# Patient Record
Sex: Male | Born: 1950 | Race: White | Hispanic: No | Marital: Married | Smoking: Former smoker
Health system: Southern US, Community
[De-identification: ages and names within clinical notes are randomized; demographics above are authoritative.]

## PROBLEM LIST (undated history)

## (undated) DIAGNOSIS — R079 Chest pain, unspecified: Secondary | ICD-10-CM

## (undated) DIAGNOSIS — J029 Acute pharyngitis, unspecified: Secondary | ICD-10-CM

## (undated) DIAGNOSIS — M79604 Pain in right leg: Secondary | ICD-10-CM

## (undated) DIAGNOSIS — R7303 Prediabetes: Secondary | ICD-10-CM

## (undated) DIAGNOSIS — M5416 Radiculopathy, lumbar region: Secondary | ICD-10-CM

## (undated) DIAGNOSIS — K219 Gastro-esophageal reflux disease without esophagitis: Secondary | ICD-10-CM

## (undated) DIAGNOSIS — E119 Type 2 diabetes mellitus without complications: Secondary | ICD-10-CM

## (undated) DIAGNOSIS — M25519 Pain in unspecified shoulder: Secondary | ICD-10-CM

## (undated) DIAGNOSIS — J189 Pneumonia, unspecified organism: Secondary | ICD-10-CM

## (undated) DIAGNOSIS — E785 Hyperlipidemia, unspecified: Secondary | ICD-10-CM

## (undated) DIAGNOSIS — I4891 Unspecified atrial fibrillation: Secondary | ICD-10-CM

## (undated) DIAGNOSIS — M549 Dorsalgia, unspecified: Secondary | ICD-10-CM

## (undated) DIAGNOSIS — M069 Rheumatoid arthritis, unspecified: Secondary | ICD-10-CM

## (undated) DIAGNOSIS — I10 Essential (primary) hypertension: Secondary | ICD-10-CM

## (undated) DIAGNOSIS — K4090 Unilateral inguinal hernia, without obstruction or gangrene, not specified as recurrent: Secondary | ICD-10-CM

## (undated) DIAGNOSIS — M25512 Pain in left shoulder: Secondary | ICD-10-CM

## (undated) DIAGNOSIS — F17200 Nicotine dependence, unspecified, uncomplicated: Secondary | ICD-10-CM

## (undated) DIAGNOSIS — G4700 Insomnia, unspecified: Secondary | ICD-10-CM

## (undated) DIAGNOSIS — G8929 Other chronic pain: Secondary | ICD-10-CM

## (undated) DIAGNOSIS — K2100 Gastro-esophageal reflux disease with esophagitis, without bleeding: Secondary | ICD-10-CM

## (undated) DIAGNOSIS — M1990 Unspecified osteoarthritis, unspecified site: Secondary | ICD-10-CM

## (undated) DIAGNOSIS — I499 Cardiac arrhythmia, unspecified: Secondary | ICD-10-CM

## (undated) DIAGNOSIS — Z950 Presence of cardiac pacemaker: Secondary | ICD-10-CM

## (undated) HISTORY — DX: Pain in right leg: M79.604

## (undated) HISTORY — DX: Chest pain, unspecified: R07.9

## (undated) HISTORY — DX: Hyperlipidemia, unspecified: E78.5

## (undated) HISTORY — DX: Rheumatoid arthritis, unspecified: M06.9

## (undated) HISTORY — DX: Nicotine dependence, unspecified, uncomplicated: F17.200

## (undated) HISTORY — DX: Unspecified atrial fibrillation: I48.91

## (undated) HISTORY — DX: Insomnia, unspecified: G47.00

## (undated) HISTORY — DX: Presence of cardiac pacemaker: Z95.0

## (undated) HISTORY — PX: SHOULDER ARTHROSCOPY: SHX128

## (undated) HISTORY — DX: Type 2 diabetes mellitus without complications: E11.9

## (undated) HISTORY — DX: Unilateral inguinal hernia, without obstruction or gangrene, not specified as recurrent: K40.90

## (undated) HISTORY — PX: LEFT ATRIAL APPENDAGE OCCLUSION: SHX173A

## (undated) HISTORY — DX: Unspecified osteoarthritis, unspecified site: M19.90

## (undated) HISTORY — PX: DENTAL SURGERY: SHX609

## (undated) HISTORY — DX: Gastro-esophageal reflux disease with esophagitis, without bleeding: K21.00

## (undated) HISTORY — PX: CHOLECYSTECTOMY: SHX55

## (undated) HISTORY — PX: VASECTOMY: SHX75

## (undated) HISTORY — DX: Pain in left shoulder: M25.512

---
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
## 2009-01-02 ENCOUNTER — Ambulatory Visit: Payer: Self-pay | Admitting: Cardiology

## 2010-08-28 ENCOUNTER — Ambulatory Visit (HOSPITAL_COMMUNITY)
Admission: RE | Admit: 2010-08-28 | Discharge: 2010-08-28 | Payer: Self-pay | Source: Home / Self Care | Attending: Pulmonary Disease | Admitting: Pulmonary Disease

## 2012-03-09 ENCOUNTER — Encounter: Payer: Self-pay | Admitting: Family Medicine

## 2012-03-24 ENCOUNTER — Telehealth (HOSPITAL_COMMUNITY): Payer: Self-pay | Admitting: Dietician

## 2012-03-24 NOTE — Telephone Encounter (Signed)
Received referral via fax from Dr. Hawkins for dx: diabetes. 

## 2012-03-31 NOTE — Telephone Encounter (Signed)
Sent letter to pt home via US Mail in attempt to contact pt to schedule appointment.  

## 2012-04-06 NOTE — Telephone Encounter (Signed)
Sent letter to pt home via US Mail in attempt to contact pt to schedule appointment.  

## 2012-04-10 NOTE — Telephone Encounter (Signed)
Pt has not responded to attempts to contact to schedule appointment. Referral filed.  

## 2012-04-22 LAB — HM DIABETES FOOT EXAM

## 2013-02-12 DIAGNOSIS — R079 Chest pain, unspecified: Secondary | ICD-10-CM

## 2013-03-31 ENCOUNTER — Encounter: Payer: Self-pay | Admitting: Cardiovascular Disease

## 2013-04-30 ENCOUNTER — Ambulatory Visit: Payer: Private Health Insurance - Indemnity | Admitting: Cardiovascular Disease

## 2013-06-10 DIAGNOSIS — Z950 Presence of cardiac pacemaker: Secondary | ICD-10-CM | POA: Insufficient documentation

## 2013-06-10 DIAGNOSIS — I10 Essential (primary) hypertension: Secondary | ICD-10-CM | POA: Insufficient documentation

## 2013-06-10 DIAGNOSIS — E119 Type 2 diabetes mellitus without complications: Secondary | ICD-10-CM | POA: Insufficient documentation

## 2013-06-14 ENCOUNTER — Encounter: Payer: Self-pay | Admitting: Cardiovascular Disease

## 2013-06-14 ENCOUNTER — Ambulatory Visit (INDEPENDENT_AMBULATORY_CARE_PROVIDER_SITE_OTHER): Payer: Private Health Insurance - Indemnity | Admitting: Cardiovascular Disease

## 2013-06-14 ENCOUNTER — Encounter: Payer: Self-pay | Admitting: *Deleted

## 2013-06-14 VITALS — BP 151/89 | HR 72 | Ht 67.0 in | Wt 163.0 lb

## 2013-06-14 DIAGNOSIS — R0600 Dyspnea, unspecified: Secondary | ICD-10-CM

## 2013-06-14 DIAGNOSIS — R0609 Other forms of dyspnea: Secondary | ICD-10-CM

## 2013-06-14 DIAGNOSIS — Z950 Presence of cardiac pacemaker: Secondary | ICD-10-CM

## 2013-06-14 DIAGNOSIS — R079 Chest pain, unspecified: Secondary | ICD-10-CM

## 2013-06-14 DIAGNOSIS — I10 Essential (primary) hypertension: Secondary | ICD-10-CM

## 2013-06-14 DIAGNOSIS — F17200 Nicotine dependence, unspecified, uncomplicated: Secondary | ICD-10-CM | POA: Insufficient documentation

## 2013-06-14 MED ORDER — DILTIAZEM HCL ER COATED BEADS 120 MG PO CP24: 120.0000 mg | ORAL_CAPSULE | Freq: Every day | ORAL | Status: DC

## 2013-06-14 NOTE — Assessment & Plan Note (Signed)
Surprised ischemia not looked at in setting of AV block.  Pain atypical CRF;s HTN and smoking  ECG paced F/U lexiscan myovue

## 2013-06-14 NOTE — Progress Notes (Signed)
Patient ID: [place], male   DOB: [birth year], 62 y.o.   MRN: [id number] 62 yo new patient.  Seen at [facility] April with syncope.  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  Prior history remarkable for HTN  Notes reviewed From [facility] Medtronic pacer placed Dual chamber Atrial lead had poor capture or dislodgement and had to be revised.  Patient R/O No history of CAD No mention of any echo  Results  Model Adapta -: [id number] Both leads also medtronic  Feels lethargic on beta blocker. Some atypical chest pain Some exertional with dyspnea He is a smoker and sees [employer]/support/administrative officer.  No formal PFT;s on chart. Pain seems to be related to pacer lead as it goes over clavicle. Worse in [place] but can happen with activity.  Compliant with meds. 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  All other systems reviewed and negative   General: Affect appropriate Healthy:  appears stated age HEENT: normal Neck supple with no adenopathy JVP normal no bruits no thyromegaly Lungs clear with no wheezing and good diaphragmatic motion Heart:  S1/S2 no murmur,rub, gallop or click PMI normal Abdomen: benighn, BS positve, no tenderness, no AAA no bruit.  No HSM or HJR Distal pulses intact with no bruits No edema Neuro non-focal Skin warm and dry No muscular weakness  Medications Current Outpatient Prescriptions  Medication Sig Dispense Refill  . lisinopril-hydrochlorothiazide (PRINZIDE,ZESTORETIC) 20-12.5 MG per tablet Take 1 tablet by mouth daily.      . metoprolol succinate (TOPROL-XL) 50 MG 24 hr tablet Take 50 mg by mouth daily.       Marland Kitchen omeprazole (PRILOSEC) 20 MG  capsule Take 20 mg by mouth daily.      Marland Kitchen diltiazem (CARDIZEM CD) 120 MG 24 hr capsule Take 1 capsule (120 mg total) by mouth daily.  90 capsule  3   No current facility-administered medications for this visit.    Allergies Review of patient's allergies indicates not on file.  Family History: No family history on file.  Social History: History   Social History  . Marital Status: Married    Spouse Name: N/A    Number of Children: N/A  . Years of Education: N/A   Occupational History  . Not on file.   Social History Main Topics  . Smoking status: Current Every Day Smoker -- 0.50 packs/day  . Smokeless tobacco: Not on file  . Alcohol Use: Not on file  . Drug Use: Not on file  . Sexual Activity: Not on file   Other Topics Concern  . Not on file   Social History Narrative  . No narrative on file    Electrocardiogram:  AV pacing rate 83  Assessment and Plan

## 2013-06-14 NOTE — Patient Instructions (Addendum)
Your physician recommends that you schedule a follow-up appointment in:TO ESTABLISH WITH [place] FOR PACER CHECK  Your physician has requested that you have an echocardiogram. Echocardiography is a painless test that uses sound waves to create images of your heart. It provides your doctor with information about the size and shape of your heart and how well your heart's chambers and valves are working. This procedure takes approximately one hour. There are no restrictions for this procedure.  Your physician has requested that you have en exercise stress myoview. For further information please visit [email or web address] Please follow instruction sheet, as given.  WE WILL CALL YOU WITH YOUR TEST RESULTS/INSTRUCTIONS/NEXT STEPS ONCE RECEIVED BY THE PROVIDER   Your physician has recommended you make the following change in your medication:   1) STOP TAKING METOPROLOL  2) START TAKING CARDIZEM CD 120MG  ONCE DAILY

## 2013-06-14 NOTE — Assessment & Plan Note (Signed)
Counseled for less than 10 minutes F/U Hawkins for PFTs and further discussion of Chantix

## 2013-06-14 NOTE — Assessment & Plan Note (Signed)
Needs to establish with GT and pacer clinic.  Especially with need for RA lead revision ECG ok.  Echo to assess RV/LV size and extent of TR Post transvenous lead

## 2013-06-14 NOTE — Assessment & Plan Note (Signed)
Change toprol ot cardizem and see if lethargy improves

## 2013-06-23 ENCOUNTER — Encounter: Payer: Self-pay | Admitting: Internal Medicine

## 2013-06-23 ENCOUNTER — Ambulatory Visit (INDEPENDENT_AMBULATORY_CARE_PROVIDER_SITE_OTHER): Payer: Private Health Insurance - Indemnity | Admitting: Internal Medicine

## 2013-06-23 VITALS — BP 112/75 | HR 94 | Ht 67.0 in | Wt 161.0 lb

## 2013-06-23 DIAGNOSIS — I10 Essential (primary) hypertension: Secondary | ICD-10-CM

## 2013-06-23 DIAGNOSIS — T82198A Other mechanical complication of other cardiac electronic device, initial encounter: Secondary | ICD-10-CM

## 2013-06-23 DIAGNOSIS — Z950 Presence of cardiac pacemaker: Secondary | ICD-10-CM

## 2013-06-23 DIAGNOSIS — I442 Atrioventricular block, complete: Secondary | ICD-10-CM

## 2013-06-23 LAB — MDC_IDC_ENUM_SESS_TYPE_INCLINIC
Brady Statistic AP VS Percent: 9 %
Brady Statistic AS VS Percent: 65 %
Lead Channel Impedance Value: 791 Ohm
Lead Channel Pacing Threshold Amplitude: 0.5 V
Lead Channel Pacing Threshold Pulse Width: 0.4 ms
Lead Channel Sensing Intrinsic Amplitude: 4 mV
Lead Channel Setting Pacing Amplitude: 1.5 V

## 2013-06-23 NOTE — Patient Instructions (Addendum)
Your physician recommends that you schedule a follow-up appointment in: to be determined with Dr Ladona Ridgel and 6 months with Alisia Ferrari will receive a reminder letter two months in advance reminding you to call and schedule your appointment. If you don't receive this letter, please contact our office.   A chest x-ray takes a picture of the organs and structures inside the chest, including the heart, lungs, and blood vessels. This test can show several things, including, whether the heart is enlarges; whether fluid is building up in the lungs; and whether pacemaker / defibrillator leads are still in place.

## 2013-06-23 NOTE — Progress Notes (Signed)
      HPI [place] is referred today by Dr. Eden Emms for ongoing evaluation and management of a permanent pacemaker. He is a very pleasant 62 year old man who experienced recurrent syncope several months ago while vacationing at [place]. He was simply found to have intermittent complete heart block and underwent permanent pacemaker insertion. The procedure was complicated by atrial lead dislodgment. He is now transferring his care to our office as the patient lives in [place]. 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 He denies shortness of breath. He has rare episodes of chest discomfort, mostly nonexertional. No peripheral edema. Since his pacemaker was placed, he has had no recurrent syncope. Not on File   Current Outpatient Prescriptions  Medication Sig Dispense Refill  . diltiazem (CARDIZEM CD) 120 MG 24 hr capsule Take 1 capsule (120 mg total) by mouth daily.  90 capsule  3  . lisinopril-hydrochlorothiazide (PRINZIDE,ZESTORETIC) 20-12.5 MG per tablet Take 1 tablet by mouth daily.      Marland Kitchen omeprazole (PRILOSEC) 20 MG capsule Take 20 mg by mouth daily.       No current facility-administered medications for this visit.     History reviewed. No pertinent past medical history.  ROS:   All systems reviewed and negative except as noted in the HPI.   History reviewed. No pertinent past surgical history.   No family history on file.   History   Social History  . Marital Status: Married    Spouse Name: N/A    Number of Children: N/A  . Years of Education: N/A   Occupational History  . Not on file.   Social History Main Topics  . Smoking status: Current Every Day Smoker -- 0.50 packs/day  . Smokeless tobacco: Not on file  . Alcohol Use: Not on file  . Drug Use: Not on file  . Sexual Activity: Not on file   Other Topics Concern  . Not on file   Social History Narrative  . No narrative on file     BP 112/75  Pulse 94  Ht 5\' 7"   (1.702 m)  Wt 161 lb (73.029 kg)  BMI 25.21 kg/m2  Physical Exam:  Well appearing middle-aged man, NAD HEENT: Unremarkable Neck:  No JVD, no thyromegally Back:  No CVA tenderness Lungs:  Clear with no wheezes, rales, or rhonchi. HEART:  Regular rate rhythm, no murmurs, no rubs, no clicks Abd:  soft, positive bowel sounds, no organomegally, no rebound, no guarding Ext:  2 plus pulses, no edema, no cyanosis, no clubbing Skin:  No rashes no nodules Neuro:  CN II through XII intact, motor grossly intact   DEVICE  Normal device function   Except for some atrialelevated pacing thresholds.  See PaceArt for details.   Assess/Plan:

## 2013-06-23 NOTE — Assessment & Plan Note (Signed)
Interrogation of his Medtronic pacemaker suggest that his atrial pacing threshold is elevated. His P waves appear to be satisfactory. This raises the suspicion that his atrial lead was dislodged, and I recommended that he undergo PA and lateral chest x-ray. It is lead appears to be stuck to the atrial myocardium, then I would plan to reprogram the device as he is pacing in the atrium very minimally. It is lead is free-floating, then we would strongly consider lead revision.

## 2013-06-23 NOTE — Assessment & Plan Note (Signed)
His blood pressure appears to be well-controlled. He'll continue his current medical therapy. He is encouraged to maintain a low-sodium diet.

## 2013-06-28 ENCOUNTER — Ambulatory Visit (HOSPITAL_COMMUNITY)
Admission: RE | Admit: 2013-06-28 | Discharge: 2013-06-28 | Disposition: A | Discharge status: Home / Self Care | Payer: Private Health Insurance - Indemnity | Source: Ambulatory Visit | Attending: Internal Medicine | Admitting: Internal Medicine

## 2013-06-28 ENCOUNTER — Encounter (HOSPITAL_COMMUNITY)
Admission: RE | Admit: 2013-06-28 | Discharge: 2013-06-28 | Disposition: A | Discharge status: Home / Self Care | Payer: Private Health Insurance - Indemnity | Source: Ambulatory Visit | Attending: Cardiovascular Disease | Admitting: Cardiovascular Disease

## 2013-06-28 ENCOUNTER — Encounter (HOSPITAL_COMMUNITY): Payer: Self-pay

## 2013-06-28 ENCOUNTER — Encounter: Payer: Self-pay | Admitting: Internal Medicine

## 2013-06-28 ENCOUNTER — Ambulatory Visit (HOSPITAL_COMMUNITY)
Admission: RE | Admit: 2013-06-28 | Discharge: 2013-06-28 | Disposition: A | Discharge status: Home / Self Care | Payer: Private Health Insurance - Indemnity | Source: Ambulatory Visit | Attending: Cardiovascular Disease | Admitting: Cardiovascular Disease

## 2013-06-28 DIAGNOSIS — R0989 Other specified symptoms and signs involving the circulatory and respiratory systems: Secondary | ICD-10-CM | POA: Insufficient documentation

## 2013-06-28 DIAGNOSIS — R079 Chest pain, unspecified: Secondary | ICD-10-CM

## 2013-06-28 DIAGNOSIS — I10 Essential (primary) hypertension: Secondary | ICD-10-CM

## 2013-06-28 DIAGNOSIS — Z950 Presence of cardiac pacemaker: Secondary | ICD-10-CM | POA: Insufficient documentation

## 2013-06-28 DIAGNOSIS — T82198A Other mechanical complication of other cardiac electronic device, initial encounter: Secondary | ICD-10-CM

## 2013-06-28 DIAGNOSIS — R0600 Dyspnea, unspecified: Secondary | ICD-10-CM

## 2013-06-28 DIAGNOSIS — R0609 Other forms of dyspnea: Secondary | ICD-10-CM | POA: Insufficient documentation

## 2013-06-28 DIAGNOSIS — E119 Type 2 diabetes mellitus without complications: Secondary | ICD-10-CM | POA: Insufficient documentation

## 2013-06-28 DIAGNOSIS — F17200 Nicotine dependence, unspecified, uncomplicated: Secondary | ICD-10-CM | POA: Insufficient documentation

## 2013-06-28 DIAGNOSIS — I519 Heart disease, unspecified: Secondary | ICD-10-CM

## 2013-06-28 MED ORDER — TECHNETIUM TC 99M SESTAMIBI GENERIC - CARDIOLITE
10.0000 | Freq: Once | INTRAVENOUS | Status: AC | PRN
  Administered 2013-06-28: 10 via INTRAVENOUS

## 2013-06-28 MED ORDER — REGADENOSON 0.4 MG/5ML IV SOLN
INTRAVENOUS | Status: AC
  Filled 2013-06-28: qty 5

## 2013-06-28 MED ORDER — TECHNETIUM TC 99M SESTAMIBI - CARDIOLITE
30.0000 | Freq: Once | INTRAVENOUS | Status: AC | PRN
  Administered 2013-06-28: 11:00:00 30 via INTRAVENOUS

## 2013-06-28 MED ORDER — SODIUM CHLORIDE 0.9 % IJ SOLN
INTRAMUSCULAR | Status: AC
  Administered 2013-06-28: 10 mL via INTRAVENOUS
  Filled 2013-06-28: qty 10

## 2013-06-28 NOTE — Progress Notes (Signed)
Stress Lab Nurses Notes - [facility]  Adam [place] 06/28/2013 Reason for doing test: Chest Pain Type of test: Stress Cardiolite Nurse performing test: Parke Poisson, RN Nuclear Medicine Tech: Lyndel Pleasure Echo Tech: Not Applicable MD performing test: Dr.Branch/ K.Lawrence NP Family MD: Dr. Juanetta Gosling Test explained and consent signed: yes IV started: 22g jelco, Saline lock flushed, No redness or edema and Saline lock started in radiology Symptoms: Fatigue & knee discomfort. Treatment/Intervention: None Reason test stopped: fatigue and reached target HR After recovery IV was: Discontinued via X-ray tech and No redness or edema Patient to return to Nuc. Med at :11:45 Patient discharged: Home Patient's Condition upon discharge was: stable Comments: During test peak BP 200/84 & HR 157.  Recovery BP 132/86 & HR 82.  Symptoms resolved in recovery. [name] T

## 2013-06-28 NOTE — Progress Notes (Signed)
*  PRELIMINARY RESULTS* Echocardiogram 2D Echocardiogram has been performed.  Coco Sharpnack 06/28/2013, 12:59 PM

## 2013-11-11 ENCOUNTER — Encounter: Payer: Self-pay | Admitting: *Deleted

## 2013-12-06 ENCOUNTER — Encounter: Payer: Self-pay | Admitting: Internal Medicine

## 2013-12-06 ENCOUNTER — Ambulatory Visit (INDEPENDENT_AMBULATORY_CARE_PROVIDER_SITE_OTHER): Payer: Managed Care, Other (non HMO) | Admitting: Internal Medicine

## 2013-12-06 VITALS — BP 132/83 | HR 86 | Ht 66.0 in | Wt 174.5 lb

## 2013-12-06 DIAGNOSIS — Z950 Presence of cardiac pacemaker: Secondary | ICD-10-CM | POA: Diagnosis not present

## 2013-12-06 DIAGNOSIS — E119 Type 2 diabetes mellitus without complications: Secondary | ICD-10-CM | POA: Diagnosis not present

## 2013-12-06 DIAGNOSIS — I442 Atrioventricular block, complete: Secondary | ICD-10-CM | POA: Diagnosis not present

## 2013-12-06 LAB — MDC_IDC_ENUM_SESS_TYPE_INCLINIC
Battery Impedance: 100 Ohm
Battery Voltage: 2.8 V
Brady Statistic AP VS Percent: 10 %
Brady Statistic AS VP Percent: 25 %
Date Time Interrogation Session: 20150511115231
Lead Channel Impedance Value: 564 Ohm
Lead Channel Pacing Threshold Amplitude: 0.5 V
Lead Channel Pacing Threshold Pulse Width: 0.4 ms
Lead Channel Setting Pacing Amplitude: 2 V
MDC IDC MSMT BATTERY REMAINING LONGEVITY: 164 mo
MDC IDC MSMT LEADCHNL RA SENSING INTR AMPL: 4 mV
MDC IDC MSMT LEADCHNL RV IMPEDANCE VALUE: 742 Ohm
MDC IDC MSMT LEADCHNL RV PACING THRESHOLD AMPLITUDE: 0.75 V
MDC IDC MSMT LEADCHNL RV PACING THRESHOLD PULSEWIDTH: 0.4 ms
MDC IDC MSMT LEADCHNL RV SENSING INTR AMPL: 22.4 mV
MDC IDC SET LEADCHNL RA PACING AMPLITUDE: 1.5 V
MDC IDC SET LEADCHNL RV PACING PULSEWIDTH: 0.4 ms
MDC IDC SET LEADCHNL RV SENSING SENSITIVITY: 5.6 mV
MDC IDC STAT BRADY AP VP PERCENT: 10 %
MDC IDC STAT BRADY AS VS PERCENT: 55 %

## 2013-12-06 NOTE — Assessment & Plan Note (Signed)
His device is working normally. Will recheck in several months. 

## 2013-12-06 NOTE — Patient Instructions (Signed)
Your physician recommends that you schedule a follow-up appointment in: 1 year with Dr Taylor You will receive a reminder letter two months in advance reminding you to call and schedule your appointment. If you don't receive this letter, please contact our office.  Your physician recommends that you continue on your current medications as directed. Please refer to the Current Medication list given to you today.   

## 2013-12-06 NOTE — Assessment & Plan Note (Signed)
He is diet controlled. He has had one episode of atrial fib on the monitor. He blood sugars are running around a100-120 now that he has lost 20 lbs. He may well ultimately need to be on a NOAC or coumadin, particularly if he requires medical therapy.

## 2013-12-06 NOTE — Progress Notes (Signed)
      HPI [place] returns today for ongoing evaluation and management of a permanent pacemaker. He is a very pleasant 63 year old man who experienced recurrent syncope several months ago while vacationing at [employer]. He was simply found to have intermittent complete heart block and underwent permanent pacemaker insertion. The procedure was complicated by atrial lead dislodgment. He is now transferring his care to our office as the patient lives in [place]. He has rare episodes of chest discomfort, mostly nonexertional. No peripheral edema. Since his pacemaker was placed, he has had no recurrent syncope. He denies palpitations Not on File   Current Outpatient Prescriptions  Medication Sig Dispense Refill  . ASPIRIN LOW DOSE 81 MG EC tablet daily.      Marland Kitchen diltiazem (CARDIZEM CD) 120 MG 24 hr capsule Take 1 capsule (120 mg total) by mouth daily.  90 capsule  3  . lisinopril-hydrochlorothiazide (PRINZIDE,ZESTORETIC) 20-12.5 MG per tablet Take 1 tablet by mouth daily.      Marland Kitchen omeprazole (PRILOSEC) 20 MG capsule Take 20 mg by mouth daily.       No current facility-administered medications for this visit.     History reviewed. No pertinent past medical history.  ROS:   All systems reviewed and negative except as noted in the HPI.   History reviewed. No pertinent past surgical history.   No family history on file.   History   Social History  . Marital Status: Married    Spouse Name: N/A    Number of Children: N/A  . Years of Education: N/A   Occupational History  . Not on file.   Social History Main Topics  . Smoking status: Current Every Day Smoker -- 0.50 packs/day  . Smokeless tobacco: Not on file  . Alcohol Use: Not on file  . Drug Use: Not on file  . Sexual Activity: Not on file   Other Topics Concern  . Not on file   Social History Narrative  . No narrative on file     BP 132/83  Pulse 86  Ht 5\' 6"  (1.676 m)  Wt 174 lb 8 oz (79.153 kg)  BMI  28.18 kg/m2  Physical Exam:  Well appearing middle-aged man, NAD HEENT: Unremarkable Neck:  No JVD, no thyromegally Back:  No CVA tenderness Lungs:  Clear with no wheezes, rales, or rhonchi. HEART:  Regular rate rhythm, no murmurs, no rubs, no clicks Abd:  soft, positive bowel sounds, no organomegally, no rebound, no guarding Ext:  2 plus pulses, no edema, no cyanosis, no clubbing Skin:  No rashes no nodules Neuro:  CN II through XII intact, motor grossly intact   DEVICE  Normal device function   See PaceArt for details.   Assess/Plan:

## 2014-06-02 ENCOUNTER — Other Ambulatory Visit: Payer: Self-pay | Admitting: Cardiovascular Disease

## 2014-06-17 ENCOUNTER — Other Ambulatory Visit (HOSPITAL_COMMUNITY): Payer: Self-pay | Admitting: Pulmonary Disease

## 2014-06-17 ENCOUNTER — Ambulatory Visit (HOSPITAL_COMMUNITY)
Admission: RE | Admit: 2014-06-17 | Discharge: 2014-06-17 | Disposition: A | Discharge status: Home / Self Care | Payer: Private Health Insurance - Indemnity | Source: Ambulatory Visit | Attending: Pulmonary Disease | Admitting: Pulmonary Disease

## 2014-06-17 ENCOUNTER — Ambulatory Visit (INDEPENDENT_AMBULATORY_CARE_PROVIDER_SITE_OTHER): Payer: Private Health Insurance - Indemnity | Admitting: *Deleted

## 2014-06-17 DIAGNOSIS — M79642 Pain in left hand: Secondary | ICD-10-CM | POA: Diagnosis present

## 2014-06-17 DIAGNOSIS — I442 Atrioventricular block, complete: Secondary | ICD-10-CM

## 2014-06-17 DIAGNOSIS — M79641 Pain in right hand: Secondary | ICD-10-CM | POA: Insufficient documentation

## 2014-06-17 DIAGNOSIS — M19041 Primary osteoarthritis, right hand: Secondary | ICD-10-CM | POA: Insufficient documentation

## 2014-06-17 DIAGNOSIS — M19042 Primary osteoarthritis, left hand: Secondary | ICD-10-CM | POA: Insufficient documentation

## 2014-06-17 LAB — MDC_IDC_ENUM_SESS_TYPE_INCLINIC
Battery Voltage: 2.8 V
Brady Statistic AP VS Percent: 13 %
Brady Statistic AS VS Percent: 76 %
Date Time Interrogation Session: 20151120141039
Lead Channel Pacing Threshold Amplitude: 0.5 V
Lead Channel Pacing Threshold Pulse Width: 0.4 ms
Lead Channel Pacing Threshold Pulse Width: 0.4 ms
Lead Channel Sensing Intrinsic Amplitude: 22.4 mV
Lead Channel Setting Pacing Amplitude: 2 V
Lead Channel Setting Sensing Sensitivity: 5.6 mV
MDC IDC MSMT BATTERY IMPEDANCE: 110 Ohm
MDC IDC MSMT BATTERY REMAINING LONGEVITY: 166 mo
MDC IDC MSMT LEADCHNL RA IMPEDANCE VALUE: 590 Ohm
MDC IDC MSMT LEADCHNL RA PACING THRESHOLD AMPLITUDE: 0.5 V
MDC IDC MSMT LEADCHNL RA SENSING INTR AMPL: 4 mV
MDC IDC MSMT LEADCHNL RV IMPEDANCE VALUE: 856 Ohm
MDC IDC SET LEADCHNL RA PACING AMPLITUDE: 1.5 V
MDC IDC SET LEADCHNL RV PACING PULSEWIDTH: 0.4 ms
MDC IDC STAT BRADY AP VP PERCENT: 3 %
MDC IDC STAT BRADY AS VP PERCENT: 8 %

## 2014-06-17 LAB — PSA: PSA: 1.02

## 2014-06-17 NOTE — Progress Notes (Signed)
PPM check in office. 

## 2014-07-05 ENCOUNTER — Telehealth: Payer: Self-pay | Admitting: Internal Medicine

## 2014-07-05 NOTE — Telephone Encounter (Addendum)
Called pt to make an appt to discuss re-starting anti-coagulation, has questionable stroke risk factors, pt made appt but would like to know why he needs to be on blood thinner, pls call

## 2014-07-06 NOTE — Telephone Encounter (Signed)
I left message for patient yesterday,have not had a return call from him yet.Will ty again

## 2014-07-06 NOTE — Telephone Encounter (Signed)
I spoke with patient and discussed that it was noted on pacer check he had an episode of a-fib.I explained what a-fib is and why he is a greater risk of stroke and why we use anticoagulants.Patient verbalized understanding and was satisfied with explanation and we keep scheduled apt 08/01/14

## 2014-08-01 ENCOUNTER — Encounter: Payer: Self-pay | Admitting: Internal Medicine

## 2014-08-01 ENCOUNTER — Encounter: Payer: Self-pay | Admitting: *Deleted

## 2014-08-01 ENCOUNTER — Ambulatory Visit (INDEPENDENT_AMBULATORY_CARE_PROVIDER_SITE_OTHER): Payer: BLUE CROSS/BLUE SHIELD | Admitting: Internal Medicine

## 2014-08-01 VITALS — BP 166/98 | HR 74 | Ht 67.0 in | Wt 179.0 lb

## 2014-08-01 DIAGNOSIS — R072 Precordial pain: Secondary | ICD-10-CM

## 2014-08-01 DIAGNOSIS — I480 Paroxysmal atrial fibrillation: Secondary | ICD-10-CM

## 2014-08-01 DIAGNOSIS — I442 Atrioventricular block, complete: Secondary | ICD-10-CM

## 2014-08-01 DIAGNOSIS — I10 Essential (primary) hypertension: Secondary | ICD-10-CM | POA: Diagnosis not present

## 2014-08-01 LAB — MDC_IDC_ENUM_SESS_TYPE_INCLINIC
Battery Remaining Longevity: 149 mo
Battery Voltage: 2.79 V
Brady Statistic AS VP Percent: 48 %
Date Time Interrogation Session: 20160104122247
Lead Channel Impedance Value: 581 Ohm
Lead Channel Impedance Value: 830 Ohm
MDC IDC MSMT BATTERY IMPEDANCE: 134 Ohm
MDC IDC SET LEADCHNL RA PACING AMPLITUDE: 1.5 V
MDC IDC SET LEADCHNL RV PACING AMPLITUDE: 2 V
MDC IDC SET LEADCHNL RV PACING PULSEWIDTH: 0.4 ms
MDC IDC SET LEADCHNL RV SENSING SENSITIVITY: 5.6 mV
MDC IDC STAT BRADY AP VP PERCENT: 17 %
MDC IDC STAT BRADY AP VS PERCENT: 5 %
MDC IDC STAT BRADY AS VS PERCENT: 30 %

## 2014-08-01 MED ORDER — DILTIAZEM HCL ER COATED BEADS 360 MG PO CP24: 360.0000 mg | ORAL_CAPSULE | Freq: Every day | ORAL | Status: DC

## 2014-08-01 MED ORDER — RIVAROXABAN 20 MG PO TABS: 20.0000 mg | ORAL_TABLET | Freq: Every day | ORAL | Status: DC

## 2014-08-01 NOTE — Assessment & Plan Note (Signed)
His blood pressure has been elevated. I will ask him to increase his dose of cardizem. He is encouraged to reduce his sodium intake.

## 2014-08-01 NOTE — Patient Instructions (Addendum)
Remote monitoring is used to monitor your Pacemaker of ICD from home. This monitoring reduces the number of office visits required to check your device to one time per year. It allows [place] to keep an eye on the functioning of your device to ensure it is working properly. You are scheduled for a device check from home on April 4TH. You may send your transmission at any time that day. If you have a wireless device, the transmission will be sent automatically. After your physician reviews your transmission, you will receive a postcard with your next transmission date.  Your physician recommends that you schedule a follow-up appointment after your stress test  Your physician has requested that you have en exercise stress myoview. For further information please visit [email or web address]. Please follow instruction sheet, as given.  Your physician has recommended you make the following change in your medication:   INCREASE CARDIZEM TO 360 MG DAILY  START XARELTO 20 MG DAILY   WE HAVE GIVEN YOU SAMPLES AND PHARMACY CARD  Thank you for choosing [facility]!!

## 2014-08-01 NOTE — Progress Notes (Signed)
      HPI Mr. Rote returns today for ongoing evaluation and management of a permanent pacemaker. He is a very pleasant 64 year old man who experienced recurrent syncope several months ago while vacationing at [employer]. He was subsequently found to have intermittent complete heart block and underwent permanent pacemaker insertion. He has had increasingly frequent episodes of chest pressure, which is sometimes related to exertion and sometimes not. He notes that his blood pressure has not been well controlled. He does not notice palpitations. No Known Allergies   Current Outpatient Prescriptions  Medication Sig Dispense Refill  . ASPIRIN LOW DOSE 81 MG EC tablet daily.    . Cinnamon 500 MG capsule Take 1,000 mg by mouth daily.    . Coenzyme Q10 (CO Q-10) 100 MG CAPS Take 2 tablets by mouth daily.    Marland Kitchen losartan-hydrochlorothiazide (HYZAAR) 100-12.5 MG per tablet Take 1 tablet by mouth daily.    . Omega-3 Fatty Acids (FISH OIL) 1000 MG CAPS Take 2 capsules by mouth daily.    Marland Kitchen omeprazole (PRILOSEC) 20 MG capsule Take 20 mg by mouth daily.    . vitamin B-12 (CYANOCOBALAMIN) 1000 MCG tablet Take 1,000 mcg by mouth daily.    Marland Kitchen diltiazem (CARDIZEM CD) 360 MG 24 hr capsule Take 1 capsule (360 mg total) by mouth daily. 30 capsule 3  . rivaroxaban (XARELTO) 20 MG TABS tablet Take 1 tablet (20 mg total) by mouth daily with supper. 30 tablet 3   No current facility-administered medications for this visit.     No past medical history on file.  ROS:   All systems reviewed and negative except as noted in the HPI.   No past surgical history on file.   No family history on file.   History   Social History  . Marital Status: Married    Spouse Name: N/A    Number of Children: N/A  . Years of Education: N/A   Occupational History  . Not on file.   Social History Main Topics  . Smoking status: Current Every Day Smoker -- 0.50 packs/day  . Smokeless tobacco: Not on file  . Alcohol  Use: Not on file  . Drug Use: Not on file  . Sexual Activity: Not on file   Other Topics Concern  . Not on file   Social History Narrative     BP 166/98 mmHg  Pulse 74  Ht 5\' 7"  (1.702 m)  Wt 179 lb (81.194 kg)  BMI 28.03 kg/m2  SpO2 97%  Physical Exam:  Well appearing middle-aged man, NAD HEENT: Unremarkable Neck:  No JVD, no thyromegally Back:  No CVA tenderness Lungs:  Clear with no wheezes, rales, or rhonchi. HEART:  Regular rate rhythm, no murmurs, no rubs, no clicks Abd:  soft, positive bowel sounds, no organomegally, no rebound, no guarding Ext:  2 plus pulses, no edema, no cyanosis, no clubbing Skin:  No rashes no nodules Neuro:  CN II through XII intact, motor grossly intact   DEVICE  Normal device function   See PaceArt for details.   Assess/Plan:

## 2014-08-01 NOTE — Addendum Note (Signed)
Addended by: Evans Lance on: 08/01/2014 01:46 PM   Modules accepted: Level of Service

## 2014-08-01 NOTE — Assessment & Plan Note (Signed)
I have recommended he start taking Xarelto for stroke prevention. On the PM interogation, he has had several episodes of atrial fib lasting up to 4 hours.

## 2014-08-01 NOTE — Assessment & Plan Note (Signed)
His symptoms have worsened. He has multiple cardiac risk factors. I have recommended exercise myoview. Additional rec's will depend upon the results. Of note he is paced and has an abnormal ECG.

## 2014-08-02 ENCOUNTER — Encounter: Payer: Self-pay | Admitting: Internal Medicine

## 2014-08-02 LAB — PACEMAKER DEVICE OBSERVATION

## 2014-08-12 ENCOUNTER — Encounter (HOSPITAL_COMMUNITY): Payer: BLUE CROSS/BLUE SHIELD

## 2014-08-12 ENCOUNTER — Encounter (HOSPITAL_COMMUNITY)
Admission: RE | Admit: 2014-08-12 | Discharge: 2014-08-12 | Disposition: A | Discharge status: Home / Self Care | Payer: BLUE CROSS/BLUE SHIELD | Source: Ambulatory Visit | Attending: Internal Medicine | Admitting: Internal Medicine

## 2014-08-12 ENCOUNTER — Inpatient Hospital Stay (HOSPITAL_COMMUNITY): Admission: RE | Admit: 2014-08-12 | Payer: BLUE CROSS/BLUE SHIELD | Source: Ambulatory Visit

## 2014-08-12 ENCOUNTER — Encounter (HOSPITAL_COMMUNITY): Payer: Self-pay

## 2014-08-12 ENCOUNTER — Ambulatory Visit (HOSPITAL_COMMUNITY)
Admission: RE | Admit: 2014-08-12 | Discharge: 2014-08-12 | Disposition: A | Discharge status: Home / Self Care | Payer: Managed Care, Other (non HMO) | Source: Ambulatory Visit | Attending: Internal Medicine | Admitting: Internal Medicine

## 2014-08-12 DIAGNOSIS — F17210 Nicotine dependence, cigarettes, uncomplicated: Secondary | ICD-10-CM | POA: Insufficient documentation

## 2014-08-12 DIAGNOSIS — R079 Chest pain, unspecified: Secondary | ICD-10-CM | POA: Diagnosis not present

## 2014-08-12 DIAGNOSIS — I442 Atrioventricular block, complete: Secondary | ICD-10-CM

## 2014-08-12 DIAGNOSIS — I2510 Atherosclerotic heart disease of native coronary artery without angina pectoris: Secondary | ICD-10-CM | POA: Diagnosis present

## 2014-08-12 MED ORDER — TECHNETIUM TC 99M SESTAMIBI GENERIC - CARDIOLITE
10.0000 | Freq: Once | INTRAVENOUS | Status: AC | PRN
  Administered 2014-08-12: 10 via INTRAVENOUS

## 2014-08-12 MED ORDER — TECHNETIUM TC 99M SESTAMIBI - CARDIOLITE
30.0000 | Freq: Once | INTRAVENOUS | Status: AC | PRN
  Administered 2014-08-12: 30 via INTRAVENOUS

## 2014-08-12 MED ORDER — SODIUM CHLORIDE 0.9 % IJ SOLN: 10.0000 mL | INTRAMUSCULAR | Status: DC | PRN

## 2014-08-12 MED ORDER — REGADENOSON 0.4 MG/5ML IV SOLN
INTRAVENOUS | Status: AC
  Filled 2014-08-12: qty 5

## 2014-08-12 MED ORDER — SODIUM CHLORIDE 0.9 % IJ SOLN
INTRAMUSCULAR | Status: AC
Start: 2014-08-12 — End: 2014-08-12
  Administered 2014-08-12: 10 mL via INTRAVENOUS
  Filled 2014-08-12: qty 3

## 2014-08-12 NOTE — Progress Notes (Signed)
Stress Lab Nurses Notes - [name]  [place] 08/12/2014 Reason for doing test: CAD and Chest Pain Type of test: Stress Cardiolite Nurse performing test: Gerrit Halls, RN Nuclear Medicine Tech: Redmond Baseman Echo Tech: Not Applicable MD performing test: S. McDowell/K.Purcell Nails NP Family MD: Luan Pulling Test explained and consent signed: Yes.   IV started: Saline lock flushed, No redness or edema and Saline lock started in radiology Symptoms: SOB Treatment/Intervention: None Reason test stopped: fatigue and reached target HR After recovery IV was: Discontinued via X-ray tech and No redness or edema Patient to return to Nuc. Med at : 12:00 Patient discharged: Home Patient's Condition upon discharge was: stable Comments: During test peak  BP 216/93 & HR 150.  Recovery BP 135/77 & HR 81.  Symptoms resolved in recovery. [name] T

## 2014-10-31 ENCOUNTER — Ambulatory Visit (INDEPENDENT_AMBULATORY_CARE_PROVIDER_SITE_OTHER): Payer: Medicare Other | Admitting: *Deleted

## 2014-10-31 DIAGNOSIS — I442 Atrioventricular block, complete: Secondary | ICD-10-CM | POA: Diagnosis not present

## 2014-10-31 LAB — MDC_IDC_ENUM_SESS_TYPE_REMOTE
Battery Remaining Longevity: 142 mo
Battery Voltage: 2.8 V
Brady Statistic AP VP Percent: 16 %
Lead Channel Impedance Value: 740 Ohm
Lead Channel Pacing Threshold Pulse Width: 0.4 ms
Lead Channel Sensing Intrinsic Amplitude: 16 mV
Lead Channel Sensing Intrinsic Amplitude: 2.8 mV
Lead Channel Setting Pacing Amplitude: 1.5 V
Lead Channel Setting Pacing Amplitude: 2 V
Lead Channel Setting Pacing Pulse Width: 0.4 ms
MDC IDC MSMT BATTERY IMPEDANCE: 158 Ohm
MDC IDC MSMT LEADCHNL RA IMPEDANCE VALUE: 564 Ohm
MDC IDC MSMT LEADCHNL RA PACING THRESHOLD AMPLITUDE: 0.375 V
MDC IDC MSMT LEADCHNL RA PACING THRESHOLD PULSEWIDTH: 0.4 ms
MDC IDC MSMT LEADCHNL RV PACING THRESHOLD AMPLITUDE: 0.625 V
MDC IDC SESS DTM: 20160404125611
MDC IDC SET LEADCHNL RV SENSING SENSITIVITY: 5.6 mV
MDC IDC STAT BRADY AP VS PERCENT: 3 %
MDC IDC STAT BRADY AS VP PERCENT: 45 %
MDC IDC STAT BRADY AS VS PERCENT: 35 %

## 2014-10-31 NOTE — Progress Notes (Signed)
Remote pacemaker transmission.   

## 2014-11-10 ENCOUNTER — Encounter: Payer: Self-pay | Admitting: Cardiology

## 2014-11-15 ENCOUNTER — Encounter: Payer: Self-pay | Admitting: Internal Medicine

## 2014-11-21 DIAGNOSIS — J0100 Acute maxillary sinusitis, unspecified: Secondary | ICD-10-CM | POA: Diagnosis not present

## 2014-11-22 DIAGNOSIS — Z950 Presence of cardiac pacemaker: Secondary | ICD-10-CM | POA: Diagnosis not present

## 2014-11-22 DIAGNOSIS — Z79899 Other long term (current) drug therapy: Secondary | ICD-10-CM | POA: Diagnosis not present

## 2014-11-22 DIAGNOSIS — F17200 Nicotine dependence, unspecified, uncomplicated: Secondary | ICD-10-CM | POA: Diagnosis not present

## 2014-11-22 DIAGNOSIS — R05 Cough: Secondary | ICD-10-CM | POA: Diagnosis not present

## 2014-11-22 DIAGNOSIS — I10 Essential (primary) hypertension: Secondary | ICD-10-CM | POA: Diagnosis not present

## 2014-11-22 DIAGNOSIS — E119 Type 2 diabetes mellitus without complications: Secondary | ICD-10-CM | POA: Diagnosis not present

## 2014-11-22 DIAGNOSIS — K219 Gastro-esophageal reflux disease without esophagitis: Secondary | ICD-10-CM | POA: Diagnosis not present

## 2014-11-22 DIAGNOSIS — S29011A Strain of muscle and tendon of front wall of thorax, initial encounter: Secondary | ICD-10-CM | POA: Diagnosis not present

## 2014-11-22 DIAGNOSIS — E785 Hyperlipidemia, unspecified: Secondary | ICD-10-CM | POA: Diagnosis not present

## 2014-11-22 DIAGNOSIS — R0602 Shortness of breath: Secondary | ICD-10-CM | POA: Diagnosis not present

## 2014-11-22 DIAGNOSIS — J209 Acute bronchitis, unspecified: Secondary | ICD-10-CM | POA: Diagnosis not present

## 2014-11-23 ENCOUNTER — Other Ambulatory Visit: Payer: Self-pay | Admitting: Internal Medicine

## 2014-12-15 DIAGNOSIS — Z950 Presence of cardiac pacemaker: Secondary | ICD-10-CM | POA: Diagnosis not present

## 2014-12-15 DIAGNOSIS — K210 Gastro-esophageal reflux disease with esophagitis: Secondary | ICD-10-CM | POA: Diagnosis not present

## 2014-12-15 DIAGNOSIS — I10 Essential (primary) hypertension: Secondary | ICD-10-CM | POA: Diagnosis not present

## 2014-12-15 DIAGNOSIS — E119 Type 2 diabetes mellitus without complications: Secondary | ICD-10-CM | POA: Diagnosis not present

## 2014-12-16 DIAGNOSIS — Z950 Presence of cardiac pacemaker: Secondary | ICD-10-CM | POA: Diagnosis not present

## 2014-12-16 DIAGNOSIS — E119 Type 2 diabetes mellitus without complications: Secondary | ICD-10-CM | POA: Diagnosis not present

## 2014-12-16 DIAGNOSIS — I10 Essential (primary) hypertension: Secondary | ICD-10-CM | POA: Diagnosis not present

## 2014-12-16 DIAGNOSIS — K210 Gastro-esophageal reflux disease with esophagitis: Secondary | ICD-10-CM | POA: Diagnosis not present

## 2014-12-16 LAB — MICROALBUMIN, URINE: Microalb, Ur: 7.9

## 2015-02-09 ENCOUNTER — Telehealth: Payer: Self-pay | Admitting: *Deleted

## 2015-02-09 NOTE — Telephone Encounter (Signed)
UNABLE TO REACH PT TO GET FAMILY STATUS.

## 2015-02-11 DIAGNOSIS — Z8674 Personal history of sudden cardiac arrest: Secondary | ICD-10-CM | POA: Diagnosis not present

## 2015-02-11 DIAGNOSIS — S8263XA Displaced fracture of lateral malleolus of unspecified fibula, initial encounter for closed fracture: Secondary | ICD-10-CM | POA: Insufficient documentation

## 2015-02-11 DIAGNOSIS — E785 Hyperlipidemia, unspecified: Secondary | ICD-10-CM | POA: Diagnosis not present

## 2015-02-11 DIAGNOSIS — Z720 Tobacco use: Secondary | ICD-10-CM | POA: Diagnosis not present

## 2015-02-11 DIAGNOSIS — I10 Essential (primary) hypertension: Secondary | ICD-10-CM | POA: Diagnosis not present

## 2015-02-11 DIAGNOSIS — Z79899 Other long term (current) drug therapy: Secondary | ICD-10-CM | POA: Diagnosis not present

## 2015-02-11 DIAGNOSIS — M19071 Primary osteoarthritis, right ankle and foot: Secondary | ICD-10-CM | POA: Diagnosis not present

## 2015-02-11 DIAGNOSIS — S82831A Other fracture of upper and lower end of right fibula, initial encounter for closed fracture: Secondary | ICD-10-CM | POA: Diagnosis not present

## 2015-02-11 DIAGNOSIS — Z950 Presence of cardiac pacemaker: Secondary | ICD-10-CM | POA: Diagnosis not present

## 2015-02-11 DIAGNOSIS — S82434A Nondisplaced oblique fracture of shaft of right fibula, initial encounter for closed fracture: Secondary | ICD-10-CM | POA: Diagnosis not present

## 2015-02-11 DIAGNOSIS — F17200 Nicotine dependence, unspecified, uncomplicated: Secondary | ICD-10-CM | POA: Diagnosis not present

## 2015-02-11 DIAGNOSIS — Z9114 Patient's other noncompliance with medication regimen: Secondary | ICD-10-CM | POA: Diagnosis not present

## 2015-02-11 DIAGNOSIS — E119 Type 2 diabetes mellitus without complications: Secondary | ICD-10-CM | POA: Diagnosis not present

## 2015-02-11 DIAGNOSIS — Z7901 Long term (current) use of anticoagulants: Secondary | ICD-10-CM | POA: Diagnosis not present

## 2015-02-11 DIAGNOSIS — Z8701 Personal history of pneumonia (recurrent): Secondary | ICD-10-CM | POA: Diagnosis not present

## 2015-02-11 DIAGNOSIS — K219 Gastro-esophageal reflux disease without esophagitis: Secondary | ICD-10-CM | POA: Diagnosis not present

## 2015-02-11 HISTORY — DX: Displaced fracture of lateral malleolus of unspecified fibula, initial encounter for closed fracture: S82.63XA

## 2015-02-13 ENCOUNTER — Encounter: Payer: Medicare Other | Admitting: Internal Medicine

## 2015-02-15 ENCOUNTER — Encounter: Payer: Medicare Other | Admitting: Internal Medicine

## 2015-02-16 DIAGNOSIS — S99911A Unspecified injury of right ankle, initial encounter: Secondary | ICD-10-CM | POA: Diagnosis not present

## 2015-02-16 DIAGNOSIS — S8264XA Nondisplaced fracture of lateral malleolus of right fibula, initial encounter for closed fracture: Secondary | ICD-10-CM | POA: Diagnosis not present

## 2015-03-09 DIAGNOSIS — S8264XD Nondisplaced fracture of lateral malleolus of right fibula, subsequent encounter for closed fracture with routine healing: Secondary | ICD-10-CM | POA: Diagnosis not present

## 2015-03-21 ENCOUNTER — Encounter: Payer: Self-pay | Admitting: Internal Medicine

## 2015-03-21 ENCOUNTER — Ambulatory Visit (INDEPENDENT_AMBULATORY_CARE_PROVIDER_SITE_OTHER): Payer: Medicare Other | Admitting: Internal Medicine

## 2015-03-21 VITALS — BP 128/76 | HR 92 | Ht 66.0 in | Wt 181.2 lb

## 2015-03-21 DIAGNOSIS — I480 Paroxysmal atrial fibrillation: Secondary | ICD-10-CM | POA: Diagnosis not present

## 2015-03-21 DIAGNOSIS — Z720 Tobacco use: Secondary | ICD-10-CM

## 2015-03-21 DIAGNOSIS — Z950 Presence of cardiac pacemaker: Secondary | ICD-10-CM

## 2015-03-21 DIAGNOSIS — F17200 Nicotine dependence, unspecified, uncomplicated: Secondary | ICD-10-CM

## 2015-03-21 DIAGNOSIS — I10 Essential (primary) hypertension: Secondary | ICD-10-CM | POA: Diagnosis not present

## 2015-03-21 DIAGNOSIS — R071 Chest pain on breathing: Secondary | ICD-10-CM

## 2015-03-21 LAB — CUP PACEART INCLINIC DEVICE CHECK
Battery Impedance: 134 Ohm
Battery Remaining Longevity: 151 mo
Battery Voltage: 2.8 V
Brady Statistic AS VP Percent: 39 %
Date Time Interrogation Session: 20160823162925
Lead Channel Impedance Value: 547 Ohm
Lead Channel Pacing Threshold Pulse Width: 0.4 ms
Lead Channel Sensing Intrinsic Amplitude: 5.6 mV
Lead Channel Setting Pacing Amplitude: 1.5 V
Lead Channel Setting Pacing Amplitude: 2 V
Lead Channel Setting Pacing Pulse Width: 0.4 ms
Lead Channel Setting Sensing Sensitivity: 5.6 mV
MDC IDC MSMT LEADCHNL RA PACING THRESHOLD AMPLITUDE: 0.5 V
MDC IDC MSMT LEADCHNL RA PACING THRESHOLD PULSEWIDTH: 0.4 ms
MDC IDC MSMT LEADCHNL RV IMPEDANCE VALUE: 830 Ohm
MDC IDC MSMT LEADCHNL RV PACING THRESHOLD AMPLITUDE: 0.5 V
MDC IDC MSMT LEADCHNL RV SENSING INTR AMPL: 15.67 mV
MDC IDC STAT BRADY AP VP PERCENT: 13 %
MDC IDC STAT BRADY AP VS PERCENT: 6 %
MDC IDC STAT BRADY AS VS PERCENT: 43 %

## 2015-03-21 NOTE — Assessment & Plan Note (Signed)
He has multiple cardiac risk factors. However, he is asymptomatic. He will continue to increase his level of activity once his fracture leg improves.

## 2015-03-21 NOTE — Assessment & Plan Note (Signed)
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 Will recheck in several months. 

## 2015-03-21 NOTE — Assessment & Plan Note (Signed)
His blood pressure has been well controlled. He will continue his current meds. Will follow.

## 2015-03-21 NOTE — Assessment & Plan Note (Signed)
He smokes about a half pack a day. He is encouraged to stop smoking.

## 2015-03-21 NOTE — Patient Instructions (Signed)
Medication Instructions:  Your physician recommends that you continue on your current medications as directed. Please refer to the Current Medication list given to you today.   Labwork: None ordered  Testing/Procedures: None ordered  Follow-Up: Your physician wants you to follow-up in: 12 months with Dr Taylor You will receive a reminder letter in the mail two months in advance. If you don't receive a letter, please call our office to schedule the follow-up appointment.   Remote monitoring is used to monitor your Pacemaker  from home. This monitoring reduces the number of office visits required to check your device to one time per year. It allows us to keep an eye on the functioning of your device to ensure it is working properly. You are scheduled for a device check from home on 06/20/15. You may send your transmission at any time that day. If you have a wireless device, the transmission will be sent automatically. After your physician reviews your transmission, you will receive a postcard with your next transmission date.    Any Other Special Instructions Will Be Listed Below (If Applicable).   

## 2015-03-21 NOTE — Assessment & Plan Note (Signed)
He is maintaining NSR approx. 99% of the time. He will continue his systemic anti-coagulation.

## 2015-03-21 NOTE — Progress Notes (Signed)
      HPI [name] returns today for ongoing evaluation and management of a permanent pacemaker, symptomatic bradycardia and PAF. He is a very pleasant 64 year old man who experienced recurrent syncope several months ago while vacationing at [employer]. He was subsequently found to have intermittent complete heart block and underwent permanent pacemaker insertion. He notes that his blood pressure has not been well controlled. He does not notice palpitations. He recently had an accident while moving his boat trailer and fractured his ankle. He c/o a 20 lb weight gain as he has been unable to exercise.  No Known Allergies   Current Outpatient Prescriptions  Medication Sig Dispense Refill  . Cinnamon 500 MG capsule Take 1,000 mg by mouth daily.    . Coenzyme Q10 (CO Q-10) 100 MG CAPS Take 2 tablets by mouth daily.    Marland Kitchen losartan-hydrochlorothiazide (HYZAAR) 100-12.5 MG per tablet Take 1 tablet by mouth daily.    . Omega-3 Fatty Acids (FISH OIL) 1000 MG CAPS Take 2 capsules by mouth daily.    Marland Kitchen omeprazole (PRILOSEC) 20 MG capsule Take 20 mg by mouth daily.    Marland Kitchen TAZTIA XT 360 MG 24 hr capsule TAKE 1 CAPSULE BY MOUTH DAILY. 90 capsule 3  . vitamin B-12 (CYANOCOBALAMIN) 1000 MCG tablet Take 1,000 mcg by mouth daily.    Alveda Reasons 20 MG TABS tablet TAKE 1 TABLET BY MOUTH DAILY WITH SUPPER. (COUPON CARD ON FILE PDM) 90 tablet 3   No current facility-administered medications for this visit.     Past Medical History  Diagnosis Date  . Dyspnea   . A-fib   . Chest pain   . Diabetes mellitus, type II   . Pacemaker   . Smoking     ROS:   All systems reviewed and negative except as noted in the HPI.   No past surgical history on file.   No family history on file.   Social History   Social History  . Marital Status: Married    Spouse Name: N/A  . Number of Children: N/A  . Years of Education: N/A   Occupational History  . Not on file.   Social History Main Topics  . Smoking  status: Current Every Day Smoker -- 0.50 packs/day  . Smokeless tobacco: Not on file  . Alcohol Use: Not on file  . Drug Use: Not on file  . Sexual Activity: Not on file   Other Topics Concern  . Not on file   Social History Narrative     BP 128/76 mmHg  Pulse 92  Ht 5\' 6"  (1.676 m)  Wt 181 lb 3.2 oz (82.192 kg)  BMI 29.26 kg/m2  Physical Exam:  Well appearing middle-aged man, NAD HEENT: Unremarkable Neck:  6 cm JVD, no thyromegally Back:  No CVA tenderness Lungs:  Clear with no wheezes, rales, or rhonchi. HEART:  Regular rate rhythm, no murmurs, no rubs, no clicks Abd:  soft, positive bowel sounds, no organomegally, no rebound, no guarding Ext:  2 plus pulses, no edema, no cyanosis, no clubbing Skin:  No rashes no nodules Neuro:  CN II through XII intact, motor grossly intact   DEVICE  Normal device function   See PaceArt for details.   Assess/Plan:

## 2015-03-23 DIAGNOSIS — S8264XD Nondisplaced fracture of lateral malleolus of right fibula, subsequent encounter for closed fracture with routine healing: Secondary | ICD-10-CM | POA: Diagnosis not present

## 2015-03-30 ENCOUNTER — Encounter: Payer: Medicare Other | Admitting: Internal Medicine

## 2015-06-16 DIAGNOSIS — K210 Gastro-esophageal reflux disease with esophagitis: Secondary | ICD-10-CM | POA: Diagnosis not present

## 2015-06-16 DIAGNOSIS — I10 Essential (primary) hypertension: Secondary | ICD-10-CM | POA: Diagnosis not present

## 2015-06-16 DIAGNOSIS — K4000 Bilateral inguinal hernia, with obstruction, without gangrene, not specified as recurrent: Secondary | ICD-10-CM | POA: Diagnosis not present

## 2015-06-16 DIAGNOSIS — E119 Type 2 diabetes mellitus without complications: Secondary | ICD-10-CM | POA: Diagnosis not present

## 2015-06-17 DIAGNOSIS — K210 Gastro-esophageal reflux disease with esophagitis: Secondary | ICD-10-CM | POA: Diagnosis not present

## 2015-06-17 DIAGNOSIS — K4090 Unilateral inguinal hernia, without obstruction or gangrene, not specified as recurrent: Secondary | ICD-10-CM | POA: Diagnosis not present

## 2015-06-17 DIAGNOSIS — Z950 Presence of cardiac pacemaker: Secondary | ICD-10-CM | POA: Diagnosis not present

## 2015-06-17 DIAGNOSIS — J029 Acute pharyngitis, unspecified: Secondary | ICD-10-CM | POA: Diagnosis not present

## 2015-06-17 DIAGNOSIS — E119 Type 2 diabetes mellitus without complications: Secondary | ICD-10-CM | POA: Diagnosis not present

## 2015-06-17 DIAGNOSIS — I10 Essential (primary) hypertension: Secondary | ICD-10-CM | POA: Diagnosis not present

## 2015-06-20 ENCOUNTER — Telehealth: Payer: Self-pay | Admitting: Cardiology

## 2015-06-20 ENCOUNTER — Encounter: Payer: Medicare Other | Admitting: *Deleted

## 2015-06-20 NOTE — Telephone Encounter (Signed)
Spoke with pt and reminded pt of remote transmission that is due today. Pt verbalized understanding.   

## 2015-06-21 ENCOUNTER — Encounter: Payer: Self-pay | Admitting: Cardiology

## 2015-06-26 ENCOUNTER — Ambulatory Visit (INDEPENDENT_AMBULATORY_CARE_PROVIDER_SITE_OTHER): Payer: Medicare Other | Admitting: *Deleted

## 2015-06-26 DIAGNOSIS — I442 Atrioventricular block, complete: Secondary | ICD-10-CM

## 2015-06-27 ENCOUNTER — Telehealth: Payer: Self-pay | Admitting: Internal Medicine

## 2015-06-27 NOTE — Telephone Encounter (Signed)
New Message  Pt calling to see if clinic has received remote transmission from yesterday. Please call back and discus.s

## 2015-06-27 NOTE — Telephone Encounter (Signed)
Informed patient that remote was received. Patient states that he was concerned because he received a notification that he was supposed to send at 1410. I explained to patient that he can send any time during the day he's scheduled and that the time was strictly for the clinic to keep track of everyone that's supposed to send. Patient voiced understanding.

## 2015-06-28 NOTE — Progress Notes (Signed)
Remote pacemaker transmission.   

## 2015-06-30 DIAGNOSIS — E119 Type 2 diabetes mellitus without complications: Secondary | ICD-10-CM | POA: Diagnosis not present

## 2015-06-30 DIAGNOSIS — M109 Gout, unspecified: Secondary | ICD-10-CM | POA: Diagnosis not present

## 2015-06-30 DIAGNOSIS — K219 Gastro-esophageal reflux disease without esophagitis: Secondary | ICD-10-CM | POA: Diagnosis not present

## 2015-06-30 DIAGNOSIS — Z950 Presence of cardiac pacemaker: Secondary | ICD-10-CM | POA: Diagnosis not present

## 2015-06-30 DIAGNOSIS — I10 Essential (primary) hypertension: Secondary | ICD-10-CM | POA: Diagnosis not present

## 2015-06-30 DIAGNOSIS — F17200 Nicotine dependence, unspecified, uncomplicated: Secondary | ICD-10-CM | POA: Diagnosis not present

## 2015-06-30 DIAGNOSIS — Z79899 Other long term (current) drug therapy: Secondary | ICD-10-CM | POA: Diagnosis not present

## 2015-06-30 DIAGNOSIS — M25562 Pain in left knee: Secondary | ICD-10-CM | POA: Diagnosis not present

## 2015-06-30 DIAGNOSIS — Z7901 Long term (current) use of anticoagulants: Secondary | ICD-10-CM | POA: Diagnosis not present

## 2015-07-01 DIAGNOSIS — M25562 Pain in left knee: Secondary | ICD-10-CM | POA: Diagnosis not present

## 2015-07-06 LAB — CUP PACEART REMOTE DEVICE CHECK
Battery Impedance: 182 Ohm
Battery Remaining Longevity: 135 mo
Battery Voltage: 2.8 V
Brady Statistic AP VP Percent: 16 %
Brady Statistic AP VS Percent: 2 %
Brady Statistic AS VP Percent: 69 %
Date Time Interrogation Session: 20161128123448
Implantable Lead Implant Date: 20140421
Implantable Lead Location: 753860
Implantable Lead Model: 5076
Lead Channel Impedance Value: 554 Ohm
Lead Channel Impedance Value: 853 Ohm
Lead Channel Setting Pacing Amplitude: 1.5 V
Lead Channel Setting Pacing Pulse Width: 0.4 ms
Lead Channel Setting Sensing Sensitivity: 5.6 mV
MDC IDC LEAD IMPLANT DT: 20140421
MDC IDC LEAD LOCATION: 753859
MDC IDC SET LEADCHNL RV PACING AMPLITUDE: 2 V
MDC IDC STAT BRADY AS VS PERCENT: 14 %

## 2015-07-07 ENCOUNTER — Encounter: Payer: Self-pay | Admitting: Cardiology

## 2015-07-12 DIAGNOSIS — F17200 Nicotine dependence, unspecified, uncomplicated: Secondary | ICD-10-CM | POA: Diagnosis not present

## 2015-07-12 DIAGNOSIS — M2550 Pain in unspecified joint: Secondary | ICD-10-CM | POA: Diagnosis not present

## 2015-07-12 DIAGNOSIS — E119 Type 2 diabetes mellitus without complications: Secondary | ICD-10-CM | POA: Diagnosis not present

## 2015-07-12 DIAGNOSIS — R5383 Other fatigue: Secondary | ICD-10-CM | POA: Diagnosis not present

## 2015-07-12 DIAGNOSIS — R768 Other specified abnormal immunological findings in serum: Secondary | ICD-10-CM | POA: Diagnosis not present

## 2015-07-12 DIAGNOSIS — Z950 Presence of cardiac pacemaker: Secondary | ICD-10-CM | POA: Diagnosis not present

## 2015-08-02 DIAGNOSIS — R768 Other specified abnormal immunological findings in serum: Secondary | ICD-10-CM | POA: Diagnosis not present

## 2015-08-02 DIAGNOSIS — F17200 Nicotine dependence, unspecified, uncomplicated: Secondary | ICD-10-CM | POA: Diagnosis not present

## 2015-08-02 DIAGNOSIS — E119 Type 2 diabetes mellitus without complications: Secondary | ICD-10-CM | POA: Diagnosis not present

## 2015-08-02 DIAGNOSIS — R5383 Other fatigue: Secondary | ICD-10-CM | POA: Diagnosis not present

## 2015-08-02 DIAGNOSIS — M2550 Pain in unspecified joint: Secondary | ICD-10-CM | POA: Diagnosis not present

## 2015-08-02 DIAGNOSIS — Z950 Presence of cardiac pacemaker: Secondary | ICD-10-CM | POA: Diagnosis not present

## 2015-08-02 DIAGNOSIS — M0579 Rheumatoid arthritis with rheumatoid factor of multiple sites without organ or systems involvement: Secondary | ICD-10-CM | POA: Diagnosis not present

## 2015-09-13 DIAGNOSIS — E119 Type 2 diabetes mellitus without complications: Secondary | ICD-10-CM | POA: Diagnosis not present

## 2015-09-13 DIAGNOSIS — F17200 Nicotine dependence, unspecified, uncomplicated: Secondary | ICD-10-CM | POA: Diagnosis not present

## 2015-09-13 DIAGNOSIS — R768 Other specified abnormal immunological findings in serum: Secondary | ICD-10-CM | POA: Diagnosis not present

## 2015-09-13 DIAGNOSIS — M0579 Rheumatoid arthritis with rheumatoid factor of multiple sites without organ or systems involvement: Secondary | ICD-10-CM | POA: Diagnosis not present

## 2015-09-13 DIAGNOSIS — R5383 Other fatigue: Secondary | ICD-10-CM | POA: Diagnosis not present

## 2015-09-13 DIAGNOSIS — Z950 Presence of cardiac pacemaker: Secondary | ICD-10-CM | POA: Diagnosis not present

## 2015-09-13 DIAGNOSIS — M2550 Pain in unspecified joint: Secondary | ICD-10-CM | POA: Diagnosis not present

## 2015-09-19 ENCOUNTER — Other Ambulatory Visit: Payer: Self-pay | Admitting: Internal Medicine

## 2015-09-25 ENCOUNTER — Ambulatory Visit (INDEPENDENT_AMBULATORY_CARE_PROVIDER_SITE_OTHER): Payer: Medicare Other | Admitting: *Deleted

## 2015-09-25 DIAGNOSIS — I442 Atrioventricular block, complete: Secondary | ICD-10-CM | POA: Diagnosis not present

## 2015-09-25 NOTE — Progress Notes (Signed)
Remote pacemaker transmission.   

## 2015-10-13 ENCOUNTER — Encounter: Payer: Self-pay | Admitting: Cardiology

## 2015-10-13 LAB — CUP PACEART REMOTE DEVICE CHECK
Battery Remaining Longevity: 134 mo
Battery Voltage: 2.79 V
Brady Statistic AP VP Percent: 14 %
Brady Statistic AP VS Percent: 3 %
Brady Statistic AS VP Percent: 65 %
Date Time Interrogation Session: 20170227141629
Implantable Lead Implant Date: 20140421
Implantable Lead Location: 753859
Implantable Lead Model: 5076
Implantable Lead Model: 5592
Lead Channel Impedance Value: 719 Ohm
Lead Channel Pacing Threshold Amplitude: 0.5 V
Lead Channel Pacing Threshold Amplitude: 0.5 V
Lead Channel Pacing Threshold Pulse Width: 0.4 ms
Lead Channel Sensing Intrinsic Amplitude: 11.2 mV
Lead Channel Setting Pacing Amplitude: 1.5 V
Lead Channel Setting Pacing Amplitude: 2 V
Lead Channel Setting Pacing Pulse Width: 0.4 ms
Lead Channel Setting Sensing Sensitivity: 4 mV
MDC IDC LEAD IMPLANT DT: 20140421
MDC IDC LEAD LOCATION: 753860
MDC IDC MSMT BATTERY IMPEDANCE: 182 Ohm
MDC IDC MSMT LEADCHNL RA IMPEDANCE VALUE: 500 Ohm
MDC IDC MSMT LEADCHNL RA SENSING INTR AMPL: 2.8 mV
MDC IDC MSMT LEADCHNL RV PACING THRESHOLD PULSEWIDTH: 0.4 ms
MDC IDC STAT BRADY AS VS PERCENT: 18 %

## 2015-10-25 DIAGNOSIS — F17200 Nicotine dependence, unspecified, uncomplicated: Secondary | ICD-10-CM | POA: Diagnosis not present

## 2015-10-25 DIAGNOSIS — E119 Type 2 diabetes mellitus without complications: Secondary | ICD-10-CM | POA: Diagnosis not present

## 2015-10-25 DIAGNOSIS — R5383 Other fatigue: Secondary | ICD-10-CM | POA: Diagnosis not present

## 2015-10-25 DIAGNOSIS — M0579 Rheumatoid arthritis with rheumatoid factor of multiple sites without organ or systems involvement: Secondary | ICD-10-CM | POA: Diagnosis not present

## 2015-10-25 DIAGNOSIS — Z79899 Other long term (current) drug therapy: Secondary | ICD-10-CM | POA: Diagnosis not present

## 2015-10-25 DIAGNOSIS — M2550 Pain in unspecified joint: Secondary | ICD-10-CM | POA: Diagnosis not present

## 2015-10-25 DIAGNOSIS — Z950 Presence of cardiac pacemaker: Secondary | ICD-10-CM | POA: Diagnosis not present

## 2015-11-20 ENCOUNTER — Other Ambulatory Visit: Payer: Self-pay | Admitting: Internal Medicine

## 2015-12-26 ENCOUNTER — Ambulatory Visit (INDEPENDENT_AMBULATORY_CARE_PROVIDER_SITE_OTHER): Payer: Medicare Other | Admitting: *Deleted

## 2015-12-26 DIAGNOSIS — I442 Atrioventricular block, complete: Secondary | ICD-10-CM | POA: Diagnosis not present

## 2015-12-27 NOTE — Progress Notes (Signed)
Remote pacemaker transmission.   

## 2016-01-09 LAB — CUP PACEART REMOTE DEVICE CHECK
Battery Impedance: 181 Ohm
Battery Remaining Longevity: 134 mo
Battery Voltage: 2.79 V
Brady Statistic AP VP Percent: 15 %
Brady Statistic AS VP Percent: 67 %
Date Time Interrogation Session: 20170530103354
Implantable Lead Implant Date: 20140421
Implantable Lead Implant Date: 20140421
Implantable Lead Location: 753859
Implantable Lead Model: 5076
Implantable Lead Model: 5592
Lead Channel Setting Pacing Amplitude: 1.5 V
Lead Channel Setting Pacing Amplitude: 2 V
Lead Channel Setting Sensing Sensitivity: 5.6 mV
MDC IDC LEAD LOCATION: 753860
MDC IDC MSMT LEADCHNL RA IMPEDANCE VALUE: 500 Ohm
MDC IDC MSMT LEADCHNL RV IMPEDANCE VALUE: 757 Ohm
MDC IDC SET LEADCHNL RV PACING PULSEWIDTH: 0.4 ms
MDC IDC STAT BRADY AP VS PERCENT: 3 %
MDC IDC STAT BRADY AS VS PERCENT: 16 %

## 2016-01-16 ENCOUNTER — Encounter: Payer: Self-pay | Admitting: Cardiology

## 2016-01-25 DIAGNOSIS — F17200 Nicotine dependence, unspecified, uncomplicated: Secondary | ICD-10-CM | POA: Diagnosis not present

## 2016-01-25 DIAGNOSIS — Z950 Presence of cardiac pacemaker: Secondary | ICD-10-CM | POA: Diagnosis not present

## 2016-01-25 DIAGNOSIS — Z79899 Other long term (current) drug therapy: Secondary | ICD-10-CM | POA: Diagnosis not present

## 2016-01-25 DIAGNOSIS — M2550 Pain in unspecified joint: Secondary | ICD-10-CM | POA: Diagnosis not present

## 2016-01-25 DIAGNOSIS — E119 Type 2 diabetes mellitus without complications: Secondary | ICD-10-CM | POA: Diagnosis not present

## 2016-01-25 DIAGNOSIS — R5383 Other fatigue: Secondary | ICD-10-CM | POA: Diagnosis not present

## 2016-01-25 DIAGNOSIS — M0579 Rheumatoid arthritis with rheumatoid factor of multiple sites without organ or systems involvement: Secondary | ICD-10-CM | POA: Diagnosis not present

## 2016-02-13 DIAGNOSIS — M0579 Rheumatoid arthritis with rheumatoid factor of multiple sites without organ or systems involvement: Secondary | ICD-10-CM | POA: Diagnosis not present

## 2016-02-27 DIAGNOSIS — M0579 Rheumatoid arthritis with rheumatoid factor of multiple sites without organ or systems involvement: Secondary | ICD-10-CM | POA: Diagnosis not present

## 2016-03-04 DIAGNOSIS — Z7901 Long term (current) use of anticoagulants: Secondary | ICD-10-CM | POA: Diagnosis not present

## 2016-03-04 DIAGNOSIS — J189 Pneumonia, unspecified organism: Secondary | ICD-10-CM | POA: Diagnosis not present

## 2016-03-04 DIAGNOSIS — I252 Old myocardial infarction: Secondary | ICD-10-CM | POA: Diagnosis not present

## 2016-03-04 DIAGNOSIS — Z79899 Other long term (current) drug therapy: Secondary | ICD-10-CM | POA: Diagnosis not present

## 2016-03-04 DIAGNOSIS — J441 Chronic obstructive pulmonary disease with (acute) exacerbation: Secondary | ICD-10-CM | POA: Diagnosis not present

## 2016-03-04 DIAGNOSIS — M069 Rheumatoid arthritis, unspecified: Secondary | ICD-10-CM | POA: Diagnosis not present

## 2016-03-04 DIAGNOSIS — E785 Hyperlipidemia, unspecified: Secondary | ICD-10-CM | POA: Diagnosis present

## 2016-03-04 DIAGNOSIS — K219 Gastro-esophageal reflux disease without esophagitis: Secondary | ICD-10-CM | POA: Diagnosis present

## 2016-03-04 DIAGNOSIS — I480 Paroxysmal atrial fibrillation: Secondary | ICD-10-CM | POA: Diagnosis present

## 2016-03-04 DIAGNOSIS — A419 Sepsis, unspecified organism: Secondary | ICD-10-CM | POA: Insufficient documentation

## 2016-03-04 DIAGNOSIS — Z950 Presence of cardiac pacemaker: Secondary | ICD-10-CM | POA: Diagnosis not present

## 2016-03-04 DIAGNOSIS — R918 Other nonspecific abnormal finding of lung field: Secondary | ICD-10-CM | POA: Diagnosis not present

## 2016-03-04 DIAGNOSIS — I10 Essential (primary) hypertension: Secondary | ICD-10-CM | POA: Diagnosis present

## 2016-03-04 DIAGNOSIS — R0602 Shortness of breath: Secondary | ICD-10-CM | POA: Diagnosis not present

## 2016-03-04 DIAGNOSIS — F17210 Nicotine dependence, cigarettes, uncomplicated: Secondary | ICD-10-CM | POA: Diagnosis present

## 2016-03-04 DIAGNOSIS — R739 Hyperglycemia, unspecified: Secondary | ICD-10-CM | POA: Diagnosis present

## 2016-03-04 DIAGNOSIS — J181 Lobar pneumonia, unspecified organism: Secondary | ICD-10-CM | POA: Diagnosis not present

## 2016-03-05 DIAGNOSIS — M06042 Rheumatoid arthritis without rheumatoid factor, left hand: Secondary | ICD-10-CM

## 2016-03-05 DIAGNOSIS — R7303 Prediabetes: Secondary | ICD-10-CM | POA: Insufficient documentation

## 2016-03-05 DIAGNOSIS — M06041 Rheumatoid arthritis without rheumatoid factor, right hand: Secondary | ICD-10-CM | POA: Insufficient documentation

## 2016-03-21 ENCOUNTER — Encounter: Payer: Self-pay | Admitting: Internal Medicine

## 2016-03-21 ENCOUNTER — Ambulatory Visit (INDEPENDENT_AMBULATORY_CARE_PROVIDER_SITE_OTHER): Payer: Medicare Other | Admitting: Internal Medicine

## 2016-03-21 VITALS — BP 124/64 | HR 77 | Ht 66.0 in | Wt 182.0 lb

## 2016-03-21 DIAGNOSIS — I480 Paroxysmal atrial fibrillation: Secondary | ICD-10-CM | POA: Diagnosis not present

## 2016-03-21 DIAGNOSIS — Z950 Presence of cardiac pacemaker: Secondary | ICD-10-CM | POA: Diagnosis not present

## 2016-03-21 DIAGNOSIS — I442 Atrioventricular block, complete: Secondary | ICD-10-CM | POA: Diagnosis not present

## 2016-03-21 NOTE — Patient Instructions (Signed)
Your physician wants you to follow-up in: 1 Year with Dr. Lovena Le. You will receive a reminder letter in the mail two months in advance. If you don't receive a letter, please call our office to schedule the follow-up appointment.  Remote monitoring is used to monitor your Pacemaker of ICD from home. This monitoring reduces the number of office visits required to check your device to one time per year. It allows [place] to keep an eye on the functioning of your device to ensure it is working properly. You are scheduled for a device check from home on 06/22/16. You may send your transmission at any time that day. If you have a wireless device, the transmission will be sent automatically. After your physician reviews your transmission, you will receive a postcard with your next transmission date.  Your physician recommends that you continue on your current medications as directed. Please refer to the Current Medication list given to you today.  Thank you for choosing [facility]!

## 2016-03-21 NOTE — Progress Notes (Signed)
      HPI Mr. Raup returns today for ongoing evaluation and management of a permanent pacemaker, symptomatic bradycardia and PAF. He is a very pleasant 65 year old man with CHB and syncope, s/p PPM insertion. He has been found to have PAF. He has been diagnosed with RA and has recently been treated for pneumonia. In addition, he would like to stop his blood thinners.   No Known Allergies   Current Outpatient Prescriptions  Medication Sig Dispense Refill  . Cinnamon 500 MG capsule Take 1,000 mg by mouth daily.    Marland Kitchen diltiazem (TIAZAC) 360 MG 24 hr capsule TAKE 1 CAPSULE BY MOUTH DAILY. 90 capsule 3  . losartan-hydrochlorothiazide (HYZAAR) 100-12.5 MG per tablet Take 1 tablet by mouth daily.    . Multiple Vitamins-Minerals (MEGA MULTIVITAMIN FOR MEN PO) Take by mouth.    Marland Kitchen omeprazole (PRILOSEC) 20 MG capsule Take 20 mg by mouth daily.    . vitamin B-12 (CYANOCOBALAMIN) 1000 MCG tablet Take 1,000 mcg by mouth daily.    Alveda Reasons 20 MG TABS tablet TAKE 1 TABLET BY MOUTH DAILY WITH SUPPER. 90 tablet 0   No current facility-administered medications for this visit.      Past Medical History:  Diagnosis Date  . A-fib ([place])   . Arthritis   . Chest pain   . Diabetes mellitus, type II ([place])   . Dyspnea   . Pacemaker   . Smoking     ROS:   All systems reviewed and negative except as noted in the HPI.   No past surgical history on file.   No family history on file.   Social History   Social History  . Marital status: Married    Spouse name: N/A  . Number of children: N/A  . Years of education: N/A   Occupational History  . Not on file.   Social History Main Topics  . Smoking status: Current Every Day Smoker    Packs/day: 0.50  . Smokeless tobacco: Never Used  . Alcohol use Not on file  . Drug use: Unknown  . Sexual activity: Not on file   Other Topics Concern  . Not on file   Social History Narrative  . No narrative on file     BP 124/64   Pulse 77    Ht 5\' 6"  (1.676 m)   Wt 182 lb (82.6 kg)   SpO2 95%   BMI 29.38 kg/m   Physical Exam:  Well appearing middle-aged man, NAD HEENT: Unremarkable Neck:  6 cm JVD, no thyromegally Back:  No CVA tenderness Lungs:  Clear with no wheezes, rales, or rhonchi. HEART:  Regular rate rhythm, no murmurs, no rubs, no clicks Abd:  soft, positive bowel sounds, no organomegally, no rebound, no guarding Ext:  2 plus pulses, no edema, no cyanosis, no clubbing Skin:  No rashes no nodules Neuro:  CN II through XII intact, motor grossly intact   DEVICE  Normal device function   See PaceArt for details.   Assess/Plan: 1. 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 Will recheck in several months. 2. PAF - he is maintaining NSR 99% of the time. Will continue his current meds. 3. Arthritis - he has been diagnosed with RA. He is on immune therapy but still symptomatic. 4. HTN - his blood pressure is controlled. Will follow.  Mikle Bosworth.D.

## 2016-03-22 ENCOUNTER — Other Ambulatory Visit (HOSPITAL_COMMUNITY): Payer: Self-pay | Admitting: Pulmonary Disease

## 2016-03-22 ENCOUNTER — Telehealth: Payer: Self-pay | Admitting: Nurse Practitioner

## 2016-03-22 ENCOUNTER — Ambulatory Visit (HOSPITAL_COMMUNITY)
Admission: RE | Admit: 2016-03-22 | Discharge: 2016-03-22 | Disposition: A | Discharge status: Home / Self Care | Payer: Medicare Other | Source: Ambulatory Visit | Attending: Pulmonary Disease | Admitting: Pulmonary Disease

## 2016-03-22 DIAGNOSIS — M069 Rheumatoid arthritis, unspecified: Secondary | ICD-10-CM | POA: Diagnosis not present

## 2016-03-22 DIAGNOSIS — J189 Pneumonia, unspecified organism: Secondary | ICD-10-CM

## 2016-03-22 DIAGNOSIS — E119 Type 2 diabetes mellitus without complications: Secondary | ICD-10-CM | POA: Diagnosis not present

## 2016-03-22 DIAGNOSIS — R918 Other nonspecific abnormal finding of lung field: Secondary | ICD-10-CM | POA: Diagnosis not present

## 2016-03-22 DIAGNOSIS — I10 Essential (primary) hypertension: Secondary | ICD-10-CM | POA: Diagnosis not present

## 2016-03-22 DIAGNOSIS — R05 Cough: Secondary | ICD-10-CM | POA: Diagnosis not present

## 2016-03-22 DIAGNOSIS — I700 Atherosclerosis of aorta: Secondary | ICD-10-CM | POA: Insufficient documentation

## 2016-03-22 DIAGNOSIS — Z8701 Personal history of pneumonia (recurrent): Secondary | ICD-10-CM | POA: Diagnosis not present

## 2016-03-22 NOTE — Telephone Encounter (Signed)
Spoke with patient to follow-up on note received from patient's pharmacy that patient wants to switch to warfarin due to high cost of Xarelto.  Per Dr. Lovena Le, patient can switch. Patient states he does not want to take warfarin.  He states he thought the pharmacist at [place] said there was another alternative that was cheaper.  I advised that all of the drugs in this class are typically very expensive and that if he is in the donut hole with Medicare that we can help him with samples.  I advised that he will need to give [place] 2 weeks notice before he runs out of medication.  He verbalized understanding and agreement and states he wants to continue on Xarelto.  He thanked me for the call.

## 2016-04-09 ENCOUNTER — Other Ambulatory Visit: Payer: Self-pay | Admitting: Surgery

## 2016-04-09 DIAGNOSIS — K4020 Bilateral inguinal hernia, without obstruction or gangrene, not specified as recurrent: Secondary | ICD-10-CM | POA: Diagnosis not present

## 2016-04-16 ENCOUNTER — Other Ambulatory Visit: Payer: Self-pay | Admitting: Internal Medicine

## 2016-04-19 ENCOUNTER — Encounter (HOSPITAL_BASED_OUTPATIENT_CLINIC_OR_DEPARTMENT_OTHER): Payer: Self-pay | Admitting: *Deleted

## 2016-04-19 LAB — CUP PACEART INCLINIC DEVICE CHECK
Date Time Interrogation Session: 20170922170608
Implantable Lead Implant Date: 20140421
Implantable Lead Location: 753860
Implantable Lead Model: 5076
Implantable Lead Model: 5592
MDC IDC LEAD IMPLANT DT: 20140421
MDC IDC LEAD LOCATION: 753859

## 2016-04-23 NOTE — H&P (Signed)
Adam Watts. Meier 04/09/2016 1:33 PM Location: [facility] Patient #: [id number] DOB: [birth year] Married / Language: Adam Watts / Race: White Male   History of Present Illness Earnstine Regal MD; 04/09/2016 2:05 PM) The patient is a 65 year old male who presents with an inguinal hernia.  Patient is referred by Dr. Sinda Du for evaluation of symptomatic left inguinal hernia. Patient notes that the hernia is been present for approximately 1-1/2 years. It has gradually increased in size. He is having some discomfort in the left groin radiating into the left hip. He denies any signs or symptoms of intestinal obstruction. Patient has had no prior hernia repairs. He did undergo laparoscopic cholecystectomy in 2013. Patient also has a significant cardiac history and had a pacemaker placed in 2013. He is currently on Xarelto. Patient notes some discomfort in the left groin. Hernia has always been soft and at least partially reducible.   Other Problems Elbert Ewings, CMA; 04/09/2016 1:34 PM) Arthritis Atrial Fibrillation Back Pain Diabetes Mellitus Gastroesophageal Reflux Disease High blood pressure Hypercholesterolemia Kidney Stone Pancreatitis  Past Surgical History Elbert Ewings, CMA; 04/09/2016 1:34 PM) Gallbladder Surgery - Laparoscopic Oral Surgery Shoulder Surgery Right. Vasectomy  Diagnostic Studies History Elbert Ewings, [place]; 04/09/2016 1:34 PM) Colonoscopy 5-10 years ago  Allergies Elbert Ewings, CMA; 04/09/2016 1:34 PM) No Known Drug Allergies09/06/2016  Medication History Elbert Ewings, CMA; 04/09/2016 1:37 PM) Methotrexate Sodium LPF (25MG /ML Solution, Injection weekly) Active. Losartan Potassium-HCTZ (100-25MG  Tablet, Oral) Active. Claritin (10MG  Capsule, Oral) Active. Folic Acid (1MG  Tablet, Oral) Active. Omeprazole (20MG  Tablet DR, Oral daily) Active. DilTIAZem HCl ER Coated Beads (360MG  Tablet ER 24HR, Oral) Active. Xarelto (20MG   Tablet, Oral) Active. Mega-B (Oral) Active. Vitamin B12 (1000MCG Tablet ER, Oral) Active. Cinnamon (500MG  Capsule, Oral two times daily) Active. Turmeric (500MG  Capsule, Oral) Active. Co Q-10 (400MG  Capsule, Oral) Active. Probiotic Product (Oral) Active. Medications Reconciled  Social History Elbert Ewings, [place]; 04/09/2016 1:34 PM) Alcohol use Occasional alcohol use. Caffeine use Carbonated beverages, Coffee, Tea. No drug use Tobacco use Current some day smoker.  Family History Elbert Ewings, [place]; 04/09/2016 1:34 PM) Arthritis Family Members In General. Heart Disease Father. Hypertension Father.    Review of Systems Elbert Ewings CMA; 04/09/2016 1:34 PM) General Present- Fatigue. Not Present- Appetite Loss, Chills, Fever, Night Sweats, Weight Gain and Weight Loss. Skin Not Present- Change in Wart/Mole, Dryness, Hives, Jaundice, New Lesions, Non-Healing Wounds, Rash and Ulcer. HEENT Present- Seasonal Allergies and Wears glasses/contact lenses. Not Present- Earache, Hearing Loss, Hoarseness, Nose Bleed, Oral Ulcers, Ringing in the Ears, Sinus Pain, Sore Throat, Visual Disturbances and Yellow Eyes. Respiratory Not Present- Bloody sputum, Chronic Cough, Difficulty Breathing, Snoring and Wheezing. Breast Not Present- Breast Mass, Breast Pain, Nipple Discharge and Skin Changes. Cardiovascular Not Present- Chest Pain, Difficulty Breathing Lying Down, Leg Cramps, Palpitations, Rapid Heart Rate, Shortness of Breath and Swelling of Extremities. Gastrointestinal Not Present- Abdominal Pain, Bloating, Bloody Stool, Change in Bowel Habits, Chronic diarrhea, Constipation, Difficulty Swallowing, Excessive gas, Gets full quickly at meals, Hemorrhoids, Indigestion, Nausea, Rectal Pain and Vomiting. Male Genitourinary Present- Frequency. Not Present- Blood in Urine, Change in Urinary Stream, Impotence, Nocturia, Painful Urination, Urgency and Urine Leakage. Musculoskeletal Present- Back Pain,  Joint Pain and Joint Stiffness. Not Present- Muscle Pain, Muscle Weakness and Swelling of Extremities. Neurological Not Present- Decreased Memory, Fainting, Headaches, Numbness, Seizures, Tingling, Tremor, Trouble walking and Weakness. Psychiatric Not Present- Anxiety, Bipolar, Change in Sleep Pattern, Depression, Fearful and Frequent crying. Endocrine Present- New Diabetes. Not Present- Cold  Intolerance, Excessive Hunger, Hair Changes, Heat Intolerance and Hot flashes. Hematology Present- Blood Thinners and Easy Bruising. Not Present- Excessive bleeding, Gland problems, HIV and Persistent Infections.  Vitals Elbert Ewings CMA; 04/09/2016 1:38 PM) 04/09/2016 1:38 PM Weight: 187 lb Height: 67in Body Surface Area: 1.97 m Body Mass Index: 29.29 kg/m  Temp.: 97.55F(Temporal)  Pulse: 103 (Regular)  BP: 132/70 (Sitting, Left Arm, Standard)       Physical Exam Earnstine Regal MD; 04/09/2016 2:06 PM) The physical exam findings are as follows: Note:General - appears comfortable, no distress; not diaphorectic  HEENT - normocephalic; sclerae clear, gaze conjugate; mucous membranes moist, dentition good; voice normal  Neck - symmetric on extension; no palpable anterior or posterior cervical adenopathy; no palpable masses in the thyroid bed  Chest - clear bilaterally without rhonchi, rales, or wheeze; pacemaker left upper chest wall  Cor - regular rhythm with normal rate; no significant murmur  Abd - soft without distension; well-healed incisions consistent with laparoscopic cholecystectomy; no sign of umbilical hernia  GU - normal male genitalia without mass or lesion; palpation in the right inguinal canal with cough and Valsalva shows a small right inguinal hernia which is reducible; obvious bulge in the left groin; palpation shows a moderate sized hernia sac extending partially into the left hemiscrotum; this is mildly tender to compression but is partially reducible in the standing  position  Ext - non-tender without significant edema or lymphedema  Neuro - grossly intact; no tremor    Assessment & Plan Earnstine Regal MD; 04/09/2016 2:10 PM) NON-RECURRENT BILATERAL INGUINAL HERNIA WITHOUT OBSTRUCTION OR GANGRENE (K40.20) Current Plans Pt Education - Pamphlet Given - Hernia Surgery: discussed with patient and provided information. Pt Education - Pamphlet Given - Laparoscopic Hernia Repair: discussed with patient and provided information. Patient presents for evaluation of inguinal hernia on referral from his primary care physician, Dr. Luan Pulling. Patient is provided with written literature on hernia repair both by open and by laparoscopic technique to review at home with his wife.  After my examination, it was obvious that the patient has bilateral inguinal hernias. We had an extensive discussion regarding strategies for repair. I asked the patient to be evaluated by my partner, Dr. Nedra Hai, during his office visit today. Dr. Ninfa Linden is able to perform laparoscopic bilateral inguinal hernia repair. I believe that would be the patient's best decision and the patient's preference.  Dr. Ninfa Linden will dictate an addendum to this note.  The risks and benefits of the procedure have been discussed at length with the patient. The patient understands the proposed procedure, potential alternative treatments, and the course of recovery to be expected. All of the patient's questions have been answered at this time. The patient wishes to proceed with surgery.     Addendum Note(Katlin Ciszewski A. Ninfa Linden MD; 04/09/2016 2:50 PM) I agree with Dr. Harlow Asa this patient does have bilateral inguinal hernias. I believe he would benefit from a bilateral laparoscopic inguinal hernia repair with mesh. I discussed the surgical procedure with him in detail. I discussed risks which includes but is not limited to bleeding, infection, injury to surrounding structures, nerve entrapment, chronic pain,  recurrent hernia, need for further surgery, postoperative recovery, etc. He will stop his blood thinning medication 5 days preoperatively. He understands and wishes to proceed with surgery

## 2016-04-24 ENCOUNTER — Ambulatory Visit (HOSPITAL_BASED_OUTPATIENT_CLINIC_OR_DEPARTMENT_OTHER)
Admission: RE | Admit: 2016-04-24 | Discharge: 2016-04-24 | Disposition: A | Discharge status: Home / Self Care | Payer: Medicare Other | Source: Ambulatory Visit | Attending: Surgery | Admitting: Surgery

## 2016-04-24 ENCOUNTER — Ambulatory Visit (HOSPITAL_BASED_OUTPATIENT_CLINIC_OR_DEPARTMENT_OTHER): Payer: Medicare Other | Admitting: Certified Registered"

## 2016-04-24 ENCOUNTER — Encounter (HOSPITAL_BASED_OUTPATIENT_CLINIC_OR_DEPARTMENT_OTHER): Payer: Self-pay | Admitting: Certified Registered"

## 2016-04-24 ENCOUNTER — Encounter (HOSPITAL_BASED_OUTPATIENT_CLINIC_OR_DEPARTMENT_OTHER)
Admission: RE | Disposition: A | Discharge status: Home / Self Care | Payer: Self-pay | Source: Ambulatory Visit | Attending: Surgery

## 2016-04-24 DIAGNOSIS — K4020 Bilateral inguinal hernia, without obstruction or gangrene, not specified as recurrent: Secondary | ICD-10-CM | POA: Insufficient documentation

## 2016-04-24 DIAGNOSIS — F17200 Nicotine dependence, unspecified, uncomplicated: Secondary | ICD-10-CM | POA: Diagnosis not present

## 2016-04-24 DIAGNOSIS — Z8249 Family history of ischemic heart disease and other diseases of the circulatory system: Secondary | ICD-10-CM | POA: Diagnosis not present

## 2016-04-24 DIAGNOSIS — M1990 Unspecified osteoarthritis, unspecified site: Secondary | ICD-10-CM | POA: Insufficient documentation

## 2016-04-24 DIAGNOSIS — I10 Essential (primary) hypertension: Secondary | ICD-10-CM | POA: Diagnosis not present

## 2016-04-24 DIAGNOSIS — E119 Type 2 diabetes mellitus without complications: Secondary | ICD-10-CM | POA: Insufficient documentation

## 2016-04-24 DIAGNOSIS — Z79899 Other long term (current) drug therapy: Secondary | ICD-10-CM | POA: Insufficient documentation

## 2016-04-24 DIAGNOSIS — Z950 Presence of cardiac pacemaker: Secondary | ICD-10-CM | POA: Insufficient documentation

## 2016-04-24 DIAGNOSIS — E7800 Pure hypercholesterolemia, unspecified: Secondary | ICD-10-CM | POA: Insufficient documentation

## 2016-04-24 DIAGNOSIS — K219 Gastro-esophageal reflux disease without esophagitis: Secondary | ICD-10-CM | POA: Diagnosis not present

## 2016-04-24 DIAGNOSIS — I4891 Unspecified atrial fibrillation: Secondary | ICD-10-CM | POA: Diagnosis not present

## 2016-04-24 HISTORY — DX: Gastro-esophageal reflux disease without esophagitis: K21.9

## 2016-04-24 HISTORY — DX: Prediabetes: R73.03

## 2016-04-24 HISTORY — PX: INGUINAL HERNIA REPAIR: SHX194

## 2016-04-24 HISTORY — PX: INSERTION OF MESH: SHX5868

## 2016-04-24 LAB — GLUCOSE, CAPILLARY
GLUCOSE-CAPILLARY: 128 mg/dL — AB (ref 65–99)
Glucose-Capillary: 125 mg/dL — ABNORMAL HIGH (ref 65–99)

## 2016-04-24 SURGERY — REPAIR, HERNIA, INGUINAL, BILATERAL, LAPAROSCOPIC
Anesthesia: General | Site: Groin | Laterality: Bilateral

## 2016-04-24 MED ORDER — LACTATED RINGERS IV SOLN
INTRAVENOUS | Status: DC
  Administered 2016-04-24 (×2): via INTRAVENOUS

## 2016-04-24 MED ORDER — ROCURONIUM BROMIDE 10 MG/ML (PF) SYRINGE
PREFILLED_SYRINGE | INTRAVENOUS | Status: DC | PRN
  Administered 2016-04-24: 20 mg via INTRAVENOUS

## 2016-04-24 MED ORDER — SUCCINYLCHOLINE CHLORIDE 20 MG/ML IJ SOLN
INTRAMUSCULAR | Status: DC | PRN
  Administered 2016-04-24: 100 mg via INTRAVENOUS

## 2016-04-24 MED ORDER — SUGAMMADEX SODIUM 200 MG/2ML IV SOLN
INTRAVENOUS | Status: AC
  Filled 2016-04-24: qty 2

## 2016-04-24 MED ORDER — MIDAZOLAM HCL 2 MG/2ML IJ SOLN: 1.0000 mg | INTRAMUSCULAR | Status: DC | PRN

## 2016-04-24 MED ORDER — CHLORHEXIDINE GLUCONATE CLOTH 2 % EX PADS: 6.0000 | MEDICATED_PAD | Freq: Once | CUTANEOUS | Status: DC

## 2016-04-24 MED ORDER — GLYCOPYRROLATE 0.2 MG/ML IJ SOLN: 0.2000 mg | Freq: Once | INTRAMUSCULAR | Status: DC | PRN

## 2016-04-24 MED ORDER — CEFAZOLIN SODIUM-DEXTROSE 2-4 GM/100ML-% IV SOLN
2.0000 g | INTRAVENOUS | Status: AC
  Administered 2016-04-24: 2 g via INTRAVENOUS

## 2016-04-24 MED ORDER — SCOPOLAMINE 1 MG/3DAYS TD PT72: 1.0000 | MEDICATED_PATCH | Freq: Once | TRANSDERMAL | Status: DC | PRN

## 2016-04-24 MED ORDER — HYDROCODONE-ACETAMINOPHEN 5-325 MG PO TABS
ORAL_TABLET | ORAL | Status: AC
  Filled 2016-04-24: qty 1

## 2016-04-24 MED ORDER — FENTANYL CITRATE (PF) 100 MCG/2ML IJ SOLN
INTRAMUSCULAR | Status: AC
  Filled 2016-04-24: qty 2

## 2016-04-24 MED ORDER — FENTANYL CITRATE (PF) 100 MCG/2ML IJ SOLN
50.0000 ug | INTRAMUSCULAR | Status: DC | PRN
  Administered 2016-04-24 (×2): 100 ug via INTRAVENOUS

## 2016-04-24 MED ORDER — HYDROMORPHONE HCL 1 MG/ML IJ SOLN
INTRAMUSCULAR | Status: AC
  Filled 2016-04-24: qty 1

## 2016-04-24 MED ORDER — PROMETHAZINE HCL 25 MG/ML IJ SOLN: 6.2500 mg | INTRAMUSCULAR | Status: DC | PRN

## 2016-04-24 MED ORDER — BUPIVACAINE HCL (PF) 0.5 % IJ SOLN
INTRAMUSCULAR | Status: AC
  Filled 2016-04-24: qty 30

## 2016-04-24 MED ORDER — ONDANSETRON HCL 4 MG/2ML IJ SOLN
INTRAMUSCULAR | Status: DC | PRN
  Administered 2016-04-24: 4 mg via INTRAVENOUS

## 2016-04-24 MED ORDER — HYDROMORPHONE HCL 1 MG/ML IJ SOLN
0.2500 mg | INTRAMUSCULAR | Status: DC | PRN
  Administered 2016-04-24 (×4): 0.5 mg via INTRAVENOUS

## 2016-04-24 MED ORDER — SUGAMMADEX SODIUM 200 MG/2ML IV SOLN
INTRAVENOUS | Status: DC | PRN
  Administered 2016-04-24: 200 mg via INTRAVENOUS

## 2016-04-24 MED ORDER — BUPIVACAINE HCL (PF) 0.5 % IJ SOLN
INTRAMUSCULAR | Status: DC | PRN
  Administered 2016-04-24: 20 mL

## 2016-04-24 MED ORDER — PROPOFOL 10 MG/ML IV BOLUS
INTRAVENOUS | Status: DC | PRN
  Administered 2016-04-24: 200 mg via INTRAVENOUS

## 2016-04-24 MED ORDER — PROPOFOL 10 MG/ML IV BOLUS
INTRAVENOUS | Status: AC
  Filled 2016-04-24: qty 20

## 2016-04-24 MED ORDER — HYDROCODONE-ACETAMINOPHEN 7.5-325 MG PO TABS: 1.0000 | ORAL_TABLET | Freq: Once | ORAL | Status: DC | PRN

## 2016-04-24 MED ORDER — KETOROLAC TROMETHAMINE 30 MG/ML IJ SOLN
30.0000 mg | Freq: Once | INTRAMUSCULAR | Status: DC | PRN
Start: 2016-04-24 — End: 2016-04-24

## 2016-04-24 MED ORDER — HYDROCODONE-ACETAMINOPHEN 5-325 MG PO TABS
1.0000 | ORAL_TABLET | Freq: Four times a day (QID) | ORAL | Status: DC | PRN
  Administered 2016-04-24: 1 via ORAL

## 2016-04-24 MED ORDER — LIDOCAINE HCL (CARDIAC) 20 MG/ML IV SOLN
INTRAVENOUS | Status: DC | PRN
  Administered 2016-04-24: 60 mg via INTRAVENOUS

## 2016-04-24 MED ORDER — LIDOCAINE 2% (20 MG/ML) 5 ML SYRINGE
INTRAMUSCULAR | Status: AC
  Filled 2016-04-24: qty 5

## 2016-04-24 MED ORDER — TRAMADOL HCL 50 MG PO TABS: 50.0000 mg | ORAL_TABLET | Freq: Four times a day (QID) | ORAL | 0 refills | Status: DC | PRN

## 2016-04-24 SURGICAL SUPPLY — 36 items
APPLIER CLIP LOGIC TI 5 (MISCELLANEOUS) IMPLANT
BLADE CLIPPER SURG (BLADE) ×3 IMPLANT
CHLORAPREP W/TINT 26ML (MISCELLANEOUS) ×3 IMPLANT
DERMABOND ADVANCED (GAUZE/BANDAGES/DRESSINGS) ×1
DERMABOND ADVANCED .7 DNX12 (GAUZE/BANDAGES/DRESSINGS) ×2 IMPLANT
DEVICE SECURE STRAP 25 ABSORB (INSTRUMENTS) ×3 IMPLANT
DISSECT BALLN SPACEMKR + OVL (BALLOONS) ×6
DISSECTOR BALLN SPACEMKR + OVL (BALLOONS) ×4 IMPLANT
DISSECTOR BLUNT TIP ENDO 5MM (MISCELLANEOUS) IMPLANT
ELECT REM PT RETURN 9FT ADLT (ELECTROSURGICAL) ×3
ELECTRODE REM PT RTRN 9FT ADLT (ELECTROSURGICAL) ×2 IMPLANT
GLOVE BIO SURGEON STRL SZ7 (GLOVE) ×3 IMPLANT
GLOVE BIO SURGEON STRL SZ7.5 (GLOVE) ×3 IMPLANT
GLOVE BIOGEL PI IND STRL 7.0 (GLOVE) ×2 IMPLANT
GLOVE BIOGEL PI IND STRL 8 (GLOVE) ×2 IMPLANT
GLOVE BIOGEL PI INDICATOR 7.0 (GLOVE) ×1
GLOVE BIOGEL PI INDICATOR 8 (GLOVE) ×1
GLOVE SURG SIGNA 7.5 PF LTX (GLOVE) ×3 IMPLANT
GOWN STRL REUS W/ TWL LRG LVL3 (GOWN DISPOSABLE) ×2 IMPLANT
GOWN STRL REUS W/ TWL XL LVL3 (GOWN DISPOSABLE) ×4 IMPLANT
GOWN STRL REUS W/TWL LRG LVL3 (GOWN DISPOSABLE) ×1
GOWN STRL REUS W/TWL XL LVL3 (GOWN DISPOSABLE) ×2
MESH 3DMAX 4X6 LT LRG (Mesh General) ×3 IMPLANT
MESH 3DMAX 4X6 RT LRG (Mesh General) ×3 IMPLANT
NEEDLE INSUFFLATION 14GA 120MM (NEEDLE) IMPLANT
NS IRRIG 1000ML POUR BTL (IV SOLUTION) IMPLANT
PACK BASIN DAY SURGERY FS (CUSTOM PROCEDURE TRAY) ×3 IMPLANT
SET IRRIG TUBING LAPAROSCOPIC (IRRIGATION / IRRIGATOR) IMPLANT
SET TROCAR LAP APPLE-HUNT 5MM (ENDOMECHANICALS) ×3 IMPLANT
SLEEVE SCD COMPRESS KNEE MED (MISCELLANEOUS) ×3 IMPLANT
SUT MNCRL AB 4-0 PS2 18 (SUTURE) ×3 IMPLANT
TOWEL OR 17X24 6PK STRL BLUE (TOWEL DISPOSABLE) ×3 IMPLANT
TRAY FOLEY BAG SILVER LF 14FR (SET/KITS/TRAYS/PACK) IMPLANT
TRAY FOLEY BAG SILVER LF 16FR (SET/KITS/TRAYS/PACK) IMPLANT
TRAY LAPAROSCOPIC (CUSTOM PROCEDURE TRAY) ×3 IMPLANT
TUBING INSUFFLATION (TUBING) ×3 IMPLANT

## 2016-04-24 NOTE — Op Note (Signed)
LAPAROSCOPIC BILATERAL INGUINAL HERNIA REPAIR, INSERTION OF MESH  Procedure Note  Adam Watts 04/24/2016   Pre-op Diagnosis: BILATERAL INGUINAL HERNIAS     Post-op Diagnosis: same  Procedure(s): LAPAROSCOPIC BILATERAL INGUINAL HERNIA REPAIR INSERTION OF MESH (Large 3D Max)  Surgeon(s): Coralie Keens, MD  Anesthesia: General  Staff:  Circulator: Maurene Capes, RN Scrub Person: Fredderick Phenix; Philomena Doheny, RN  Estimated Blood Loss: Minimal                         Adam Watts   Date: 04/24/2016  Time: 12:12 PM

## 2016-04-24 NOTE — Interval H&P Note (Signed)
History and Physical Interval Note:no change in H and P  04/24/2016 10:37 AM  Adam Watts  has presented today for surgery, with the diagnosis of BILATERAL INGUINAL HERNIAS  The various methods of treatment have been discussed with the patient and family. After consideration of risks, benefits and other options for treatment, the patient has consented to  Procedure(s): [place] (Bilateral) INSERTION OF MESH (Bilateral) as a surgical intervention .  The patient's history has been reviewed, patient examined, no change in status, stable for surgery.  I have reviewed the patient's chart and labs.  Questions were answered to the patient's satisfaction.     Zen Felling A

## 2016-04-24 NOTE — Anesthesia Preprocedure Evaluation (Addendum)
Anesthesia Evaluation  Patient identified by MRN, date of birth, ID band Patient awake    Reviewed: Allergy & Precautions, NPO status , Patient's Chart, lab work & pertinent test results  Airway Mallampati: III  TM Distance: >3 FB Neck ROM: Full    Dental  (+) Dental Advisory Given   Pulmonary Current Smoker,    breath sounds clear to auscultation       Cardiovascular hypertension, Pt. on medications + dysrhythmias Atrial Fibrillation + pacemaker  Rhythm:Regular Rate:Normal     Neuro/Psych negative neurological ROS     GI/Hepatic Neg liver ROS, GERD  ,  Endo/Other  diabetes  Renal/GU negative Renal ROS     Musculoskeletal  (+) Arthritis , Rheumatoid disorders,    Abdominal   Peds  Hematology negative hematology ROS (+)   Anesthesia Other Findings   Reproductive/Obstetrics                            Anesthesia Physical Anesthesia Plan  ASA: II  Anesthesia Plan: General   Post-op Pain Management:    Induction: Intravenous  Airway Management Planned: Oral ETT  Additional Equipment:   Intra-op Plan:   Post-operative Plan: Extubation in OR  Informed Consent: I have reviewed the patients History and Physical, chart, labs and discussed the procedure including the risks, benefits and alternatives for the proposed anesthesia with the patient or authorized representative who has indicated his/her understanding and acceptance.   Dental advisory given  Plan Discussed with: CRNA  Anesthesia Plan Comments:         Anesthesia Quick Evaluation

## 2016-04-24 NOTE — Discharge Instructions (Signed)
CCS _______[facility]  UMBILICAL OR INGUINAL HERNIA REPAIR: POST OP INSTRUCTIONS  Always review your discharge instruction sheet given to you by the facility where your surgery was performed. IF YOU HAVE DISABILITY OR FAMILY LEAVE FORMS, YOU MUST BRING THEM TO THE OFFICE FOR PROCESSING.   DO NOT GIVE THEM TO YOUR DOCTOR.  1. A  prescription for pain medication may be given to you upon discharge.  Take your pain medication as prescribed, if needed.  If narcotic pain medicine is not needed, then you may take acetaminophen (Tylenol) or ibuprofen (Advil) as needed. 2. Take your usually prescribed medications unless otherwise directed. 3. If you need a refill on your pain medication, please contact your pharmacy.  They will contact our office to request authorization. Prescriptions will not be filled after 5 pm or on week-ends. 4. You should follow a light diet the first 24 hours after arrival home, such as soup and crackers, etc.  Be sure to include lots of fluids daily.  Resume your normal diet the day after surgery. 5. Most patients will experience some swelling and bruising around the umbilicus or in the groin and scrotum.  Ice packs and reclining will help.  Swelling and bruising can take several days to resolve.  6. It is common to experience some constipation if taking pain medication after surgery.  Increasing fluid intake and taking a stool softener (such as Colace) will usually help or prevent this problem from occurring.  A mild laxative (Milk of Magnesia or Miralax) should be taken according to package directions if there are no bowel movements after 48 hours. 7. Unless discharge instructions indicate otherwise, you may remove your bandages 24-48 hours after surgery, and you may shower at that time.  You may have steri-strips (small skin tapes) in place directly over the incision.  These strips should be left on the skin for 7-10 days.  If your surgeon used skin glue on  the incision, you may shower in 24 hours.  The glue will flake off over the next 2-3 weeks.  Any sutures or staples will be removed at the office during your follow-up visit. 8. ACTIVITIES:  You may resume regular (light) daily activities beginning the next day--such as daily self-care, walking, climbing stairs--gradually increasing activities as tolerated.  You may have sexual intercourse when it is comfortable.  Refrain from any heavy lifting or straining until approved by your doctor. a. You may drive when you are no longer taking prescription pain medication, you can comfortably wear a seatbelt, and you can safely maneuver your car and apply brakes. b. RETURN TO WORK:  __________________________________________________________ 9. You should see your doctor in the office for a follow-up appointment approximately 2-3 weeks after your surgery.  Make sure that you call for this appointment within a day or two after you arrive home to insure a convenient appointment time. 10. OTHER INSTRUCTIONS:  __________________________________________________________________________________________________________________________________________________________________________________________  WHEN TO CALL YOUR DOCTOR: 1. Fever over 101.0 2. Inability to urinate 3. Nausea and/or vomiting 4. Extreme swelling or bruising 5. Continued bleeding from incision. 6. Increased pain, redness, or drainage from the incision  The clinic staff is available to answer your questions during regular business hours.  Please dont hesitate to call and ask to speak to one of the nurses for clinical concerns.  If you have a medical emergency, go to the nearest emergency room or call 911.  A surgeon from [facility] is always on call at the hospital  [address] [place], [place], [place]  [address] ?  [address], [place], [place]   [address] [phone] ? [phone] ? FAX [phone] Web site:  [email or web address] _______[facility]  UMBILICAL OR INGUINAL HERNIA REPAIR: POST OP INSTRUCTIONS  Always review your discharge instruction sheet given to you by the facility where your surgery was performed. IF YOU HAVE DISABILITY OR FAMILY LEAVE FORMS, YOU MUST BRING THEM TO THE OFFICE FOR PROCESSING.   DO NOT GIVE THEM TO YOUR DOCTOR.  11. A  prescription for pain medication may be given to you upon discharge.  Take your pain medication as prescribed, if needed.  If narcotic pain medicine is not needed, then you may take acetaminophen (Tylenol) or ibuprofen (Advil) as needed. 12. Take your usually prescribed medications unless otherwise directed. 13. If you need a refill on your pain medication, please contact your pharmacy.  They will contact our office to request authorization. Prescriptions will not be filled after 5 pm or on week-ends. 14. You should follow a light diet the first 24 hours after arrival home, such as soup and crackers, etc.  Be sure to include lots of fluids daily.  Resume your normal diet the day after surgery. 15. Most patients will experience some swelling and bruising around the umbilicus or in the groin and scrotum.  Ice packs and reclining will help.  Swelling and bruising can take several days to resolve.  16. It is common to experience some constipation if taking pain medication after surgery.  Increasing fluid intake and taking a stool softener (such as Colace) will usually help or prevent this problem from occurring.  A mild laxative (Milk of Magnesia or Miralax) should be taken according to package directions if there are no bowel movements after 48 hours. 17. Unless discharge instructions indicate otherwise, you may remove your bandages 24-48 hours after surgery, and you may shower at that time.  You may have steri-strips (small skin tapes) in place directly over the incision.  These strips should be left on the skin for 7-10 days.  If  your surgeon used skin glue on the incision, you may shower in 24 hours.  The glue will flake off over the next 2-3 weeks.  Any sutures or staples will be removed at the office during your follow-up visit. 18. ACTIVITIES:  You may resume regular (light) daily activities beginning the next day--such as daily self-care, walking, climbing stairs--gradually increasing activities as tolerated.  You may have sexual intercourse when it is comfortable.  Refrain from any heavy lifting or straining until approved by your doctor. a. You may drive when you are no longer taking prescription pain medication, you can comfortably wear a seatbelt, and you can safely maneuver your car and apply brakes. b. RETURN TO WORK:  __________________________________________________________ 19. You should see your doctor in the office for a follow-up appointment approximately 2-3 weeks after your surgery.  Make sure that you call for this appointment within a day or two after you arrive home to insure a convenient appointment time. 20. OTHER INSTRUCTIONS: NO LIFTING MORE THAN 15 POUNDS FOR 3 WEEKS __________________________________________________________________________________________________________________________________________________________________________________________  WHEN TO CALL YOUR DOCTOR: 7. Fever over 101.0 8. Inability to urinate 9. Nausea and/or vomiting 10. Extreme swelling or bruising 11. Continued bleeding from incision. 12. Increased pain, redness, or drainage from the incision  The clinic staff is available to answer your questions during regular business hours.  Please dont hesitate to call and ask to speak to  one of the nurses for clinical concerns.  If you have a medical emergency, go to the nearest emergency room or call 911.  A surgeon from [facility] is always on call at the hospital   [address], [place], [place], [place]  [address] ?  [address], [place], [place] [phone] ? [phone] ? FAX [phone] Web site: [email or web address]      Post Anesthesia Home Care Instructions  Activity: Get plenty of rest for the remainder of the day. A responsible adult should stay with you for 24 hours following the procedure.  For the next 24 hours, DO NOT: -Drive a car -Paediatric nurse -Drink alcoholic beverages -Take any medication unless instructed by your physician -Make any legal decisions or sign important papers.  Meals: Start with liquid foods such as gelatin or soup. Progress to regular foods as tolerated. Avoid greasy, spicy, heavy foods. If nausea and/or vomiting occur, drink only clear liquids until the nausea and/or vomiting subsides. Call your physician if vomiting continues.  Special Instructions/Symptoms: Your throat may feel dry or sore from the anesthesia or the breathing tube placed in your throat during surgery. If this causes discomfort, gargle with warm salt water. The discomfort should disappear within 24 hours.  If you had a scopolamine patch placed behind your ear for the management of post- operative nausea and/or vomiting:  1. The medication in the patch is effective for 72 hours, after which it should be removed.  Wrap patch in a tissue and discard in the trash. Wash hands thoroughly with soap and water. 2. You may remove the patch earlier than 72 hours if you experience unpleasant side effects which may include dry mouth, dizziness or visual disturbances. 3. Avoid touching the patch. Wash your hands with soap and water after contact with the patch.

## 2016-04-24 NOTE — Anesthesia Postprocedure Evaluation (Signed)
Anesthesia Post Note  Patient: Adam Watts  Procedure(s) Performed: Procedure(s) (LRB): LAPAROSCOPIC BILATERAL INGUINAL HERNIA REPAIR (Bilateral) INSERTION OF MESH (Bilateral)  Patient location during evaluation: PACU Anesthesia Type: General Level of consciousness: awake and alert Pain management: pain level controlled Vital Signs Assessment: post-procedure vital signs reviewed and stable Respiratory status: spontaneous breathing, nonlabored ventilation and respiratory function stable Cardiovascular status: blood pressure returned to baseline and stable Postop Assessment: no signs of nausea or vomiting Anesthetic complications: no    Last Vitals:  Vitals:   04/24/16 1345 04/24/16 1400  BP: 101/71 (!) 123/95  Pulse: 89 86  Resp: (!) 9 19  Temp:      Last Pain:  Vitals:   04/24/16 1345  TempSrc:   PainSc: 4                  Rahkeem Senft,W. EDMOND

## 2016-04-24 NOTE — Transfer of Care (Signed)
Immediate Anesthesia Transfer of Care Note  Patient: Adam Watts  Procedure(s) Performed: Procedure(s): LAPAROSCOPIC BILATERAL INGUINAL HERNIA REPAIR (Bilateral) INSERTION OF MESH (Bilateral)  Patient Location: PACU  Anesthesia Type:General  Level of Consciousness: awake, sedated and confused  Airway & Oxygen Therapy: Patient Spontanous Breathing and Patient connected to face mask oxygen  Post-op Assessment: Report given to RN and Post -op Vital signs reviewed and stable  Post vital signs: Reviewed and stable  Last Vitals:  Vitals:   04/24/16 1041  BP: 125/70  Pulse: 83  Resp: 20  Temp: 36.6 C    Last Pain:  Vitals:   04/24/16 1041  TempSrc: Oral  PainSc: 1          Complications: No apparent anesthesia complications

## 2016-04-24 NOTE — Anesthesia Procedure Notes (Signed)
Procedure Name: Intubation Date/Time: 04/24/2016 11:21 AM Performed by: Jovani Flury D Pre-anesthesia Checklist: Patient identified, Emergency Drugs available, Suction available and Patient being monitored Patient Re-evaluated:Patient Re-evaluated prior to inductionOxygen Delivery Method: Circle system utilized Preoxygenation: Pre-oxygenation with 100% oxygen Intubation Type: IV induction Ventilation: Mask ventilation without difficulty Laryngoscope Size: Mac and 3 Grade View: Grade II Tube type: Oral Tube size: 7.0 mm Number of attempts: 1 Airway Equipment and Method: Stylet and Oral airway Placement Confirmation: ETT inserted through vocal cords under direct vision,  positive ETCO2 and breath sounds checked- equal and bilateral Secured at: 21 cm Tube secured with: Tape Dental Injury: Teeth and Oropharynx as per pre-operative assessment

## 2016-04-26 ENCOUNTER — Encounter (HOSPITAL_BASED_OUTPATIENT_CLINIC_OR_DEPARTMENT_OTHER): Payer: Self-pay | Admitting: Surgery

## 2016-04-26 NOTE — Op Note (Signed)
NAME:  Adam Watts NO.:  [id number]  MEDICAL RECORD NO.:  [id number]  LOCATION:                                 FACILITY:  PHYSICIAN:  Coralie Keens, M.D. DATE OF BIRTH:  Mar 20, 1951  DATE OF PROCEDURE:  04/24/2016 DATE OF DISCHARGE:                              OPERATIVE REPORT   PREOPERATIVE DIAGNOSIS:  Bilateral inguinal hernias.  POSTOPERATIVE DIAGNOSIS:  Bilateral inguinal hernias.  PROCEDURE:  Laparoscopic bilateral inguinal hernia repair with mesh.  SURGEON:  Coralie Keens, M.D.  ANESTHESIA:  General 0.5% Marcaine.  ESTIMATED BLOOD LOSS:  Minimal.  FINDINGS:  The patient was found to have bilateral indirect inguinal hernias with the left being larger than the right.  Both were repaired with Bard 3DMax Prolene mesh.  Large was used on both sides.  PROCEDURE IN DETAIL:  The patient was brought to the operating room, identified as Adam Watts.  He was placed supine on the operating table and general anesthesia was induced.  His abdomen was then prepped and draped in usual sterile fashion.  I made a small transverse incision just below the umbilicus of the previous scar.  I then carried this down to the fascia which was opened just to the right of the midline.  The rectus muscles were identified and elevated.  The dissecting balloon was passed underneath the rectus muscle and manipulated toward the pubis. The dissecting balloon was insufflated under direct vision dissecting out the preperitoneal space.  The dissecting balloon was then removed and insufflation was begun with carbon dioxide.  Next, two 5-mm ports were placed on the patient's lower midline both under direct vision. Insufflation of the preperitoneal space was then begun with carbon dioxide.  I dissected out the right inguinal area first.  The patient had a mild to moderate-sized indirect hernia sac which I was able to easily reduce from the cord structures.  There was no  evidence of direct hernia defect on the left side.  I then dissected out the left inguinal area.  The patient had a much larger hernia sac.  I was able to reduce the bowel back into the abdominal cavity and then finally able to free the sac from the surrounding cord structures and reduced it completely. Next, a left-sided piece of large Bard 3DMax was brought into the field. I placed it through the fascial opening and opened as an onlay on the left inguinal floor.  I then tacked it to [place] ligament up the medial abdominal wall slightly lower laterally with absorbable tacks.  Wide coverage of the cord structures in the inguinal floor appeared to be achieved.  I next brought a right-sided piece of large Bard 3DMax mesh onto the field as well.  I placed it through the fascial opening and then opened as an onlay on the right inguinal floor.  I then tacked it to Robinson ligament up the medial abdominal wall slightly laterally with the absorbable tacker.  Again, wide coverage of the cord structure defects appeared to be achieved.  At this point, hemostasis also appeared to be achieved.  I removed the midline ports and saw the preperitoneal space collapse appropriately with the mesh in place.  I  then removed the umbilical port.  I closed the fascia at the umbilicus with a figure-of-eight 0 Vicryl suture.  All incisions were anesthetized with Marcaine.  I performed bilateral ilioinguinal nerve blocks with Marcaine.  I then closed all incisions with 4-0 Monocryl subcuticular sutures.  Skin glue was then applied.  The patient tolerated the procedure well.  All sponge, needle, and instrument counts were correct at the end of the procedure.  The patient was then extubated in the operating room and taken in a stable condition to recovery room.     Coralie Keens, M.D.   ______________________________ Coralie Keens, M.D.    DB/MEDQ  D:  04/24/2016  T:  04/25/2016  Job:  [id number]

## 2016-05-09 DIAGNOSIS — M0579 Rheumatoid arthritis with rheumatoid factor of multiple sites without organ or systems involvement: Secondary | ICD-10-CM | POA: Diagnosis not present

## 2016-05-09 DIAGNOSIS — Z79899 Other long term (current) drug therapy: Secondary | ICD-10-CM | POA: Diagnosis not present

## 2016-05-09 DIAGNOSIS — Z23 Encounter for immunization: Secondary | ICD-10-CM | POA: Diagnosis not present

## 2016-05-20 DIAGNOSIS — Z950 Presence of cardiac pacemaker: Secondary | ICD-10-CM | POA: Diagnosis not present

## 2016-05-20 DIAGNOSIS — M2550 Pain in unspecified joint: Secondary | ICD-10-CM | POA: Diagnosis not present

## 2016-05-20 DIAGNOSIS — R5383 Other fatigue: Secondary | ICD-10-CM | POA: Diagnosis not present

## 2016-05-20 DIAGNOSIS — E119 Type 2 diabetes mellitus without complications: Secondary | ICD-10-CM | POA: Diagnosis not present

## 2016-05-20 DIAGNOSIS — M0579 Rheumatoid arthritis with rheumatoid factor of multiple sites without organ or systems involvement: Secondary | ICD-10-CM | POA: Diagnosis not present

## 2016-05-20 DIAGNOSIS — Z79899 Other long term (current) drug therapy: Secondary | ICD-10-CM | POA: Diagnosis not present

## 2016-05-20 DIAGNOSIS — F17200 Nicotine dependence, unspecified, uncomplicated: Secondary | ICD-10-CM | POA: Diagnosis not present

## 2016-06-14 DIAGNOSIS — Z23 Encounter for immunization: Secondary | ICD-10-CM | POA: Diagnosis not present

## 2016-06-24 ENCOUNTER — Telehealth: Payer: Self-pay | Admitting: Cardiology

## 2016-06-24 ENCOUNTER — Ambulatory Visit (INDEPENDENT_AMBULATORY_CARE_PROVIDER_SITE_OTHER): Payer: Medicare Other | Admitting: *Deleted

## 2016-06-24 DIAGNOSIS — I442 Atrioventricular block, complete: Secondary | ICD-10-CM

## 2016-06-24 NOTE — Telephone Encounter (Signed)
LMOVM reminding pt to send remote transmission.   

## 2016-06-25 NOTE — Progress Notes (Signed)
Remote pacemaker transmission.   

## 2016-06-27 ENCOUNTER — Encounter: Payer: Self-pay | Admitting: Cardiology

## 2016-07-04 DIAGNOSIS — M0579 Rheumatoid arthritis with rheumatoid factor of multiple sites without organ or systems involvement: Secondary | ICD-10-CM | POA: Diagnosis not present

## 2016-07-12 ENCOUNTER — Encounter: Payer: Self-pay | Admitting: Cardiology

## 2016-07-12 LAB — CUP PACEART REMOTE DEVICE CHECK
Battery Impedance: 205 Ohm
Brady Statistic AP VS Percent: 0 %
Brady Statistic AS VS Percent: 7 %
Date Time Interrogation Session: 20171127193626
Implantable Lead Implant Date: 20140421
Implantable Lead Location: 753859
Implantable Lead Location: 753860
Implantable Lead Model: 5076
Lead Channel Impedance Value: 825 Ohm
Lead Channel Pacing Threshold Amplitude: 0.5 V
Lead Channel Pacing Threshold Pulse Width: 0.4 ms
Lead Channel Pacing Threshold Pulse Width: 0.4 ms
Lead Channel Setting Pacing Amplitude: 2 V
Lead Channel Setting Sensing Sensitivity: 5.6 mV
MDC IDC LEAD IMPLANT DT: 20140421
MDC IDC MSMT BATTERY REMAINING LONGEVITY: 129 mo
MDC IDC MSMT BATTERY VOLTAGE: 2.79 V
MDC IDC MSMT LEADCHNL RA IMPEDANCE VALUE: 523 Ohm
MDC IDC MSMT LEADCHNL RA PACING THRESHOLD AMPLITUDE: 0.375 V
MDC IDC PG IMPLANT DT: 20140421
MDC IDC SET LEADCHNL RA PACING AMPLITUDE: 1.5 V
MDC IDC SET LEADCHNL RV PACING PULSEWIDTH: 0.4 ms
MDC IDC STAT BRADY AP VP PERCENT: 14 %
MDC IDC STAT BRADY AS VP PERCENT: 79 %

## 2016-08-05 ENCOUNTER — Other Ambulatory Visit: Payer: Self-pay | Admitting: Internal Medicine

## 2016-08-27 DIAGNOSIS — M0579 Rheumatoid arthritis with rheumatoid factor of multiple sites without organ or systems involvement: Secondary | ICD-10-CM | POA: Diagnosis not present

## 2016-08-27 DIAGNOSIS — E669 Obesity, unspecified: Secondary | ICD-10-CM | POA: Diagnosis not present

## 2016-08-27 DIAGNOSIS — R5383 Other fatigue: Secondary | ICD-10-CM | POA: Diagnosis not present

## 2016-08-27 DIAGNOSIS — Z950 Presence of cardiac pacemaker: Secondary | ICD-10-CM | POA: Diagnosis not present

## 2016-08-27 DIAGNOSIS — Z6830 Body mass index (BMI) 30.0-30.9, adult: Secondary | ICD-10-CM | POA: Diagnosis not present

## 2016-08-27 DIAGNOSIS — M2550 Pain in unspecified joint: Secondary | ICD-10-CM | POA: Diagnosis not present

## 2016-08-27 DIAGNOSIS — Z79899 Other long term (current) drug therapy: Secondary | ICD-10-CM | POA: Diagnosis not present

## 2016-08-27 DIAGNOSIS — E119 Type 2 diabetes mellitus without complications: Secondary | ICD-10-CM | POA: Diagnosis not present

## 2016-08-27 DIAGNOSIS — F17200 Nicotine dependence, unspecified, uncomplicated: Secondary | ICD-10-CM | POA: Diagnosis not present

## 2016-08-29 DIAGNOSIS — Z79899 Other long term (current) drug therapy: Secondary | ICD-10-CM | POA: Diagnosis not present

## 2016-08-29 DIAGNOSIS — M0579 Rheumatoid arthritis with rheumatoid factor of multiple sites without organ or systems involvement: Secondary | ICD-10-CM | POA: Diagnosis not present

## 2016-09-23 ENCOUNTER — Ambulatory Visit (INDEPENDENT_AMBULATORY_CARE_PROVIDER_SITE_OTHER): Payer: Medicare Other | Admitting: *Deleted

## 2016-09-23 DIAGNOSIS — I442 Atrioventricular block, complete: Secondary | ICD-10-CM | POA: Diagnosis not present

## 2016-09-23 NOTE — Progress Notes (Signed)
Remote pacemaker transmission.   

## 2016-09-24 ENCOUNTER — Encounter: Payer: Self-pay | Admitting: Cardiology

## 2016-09-25 LAB — CUP PACEART REMOTE DEVICE CHECK
Battery Remaining Longevity: 119 mo
Battery Voltage: 2.8 V
Brady Statistic AP VP Percent: 15 %
Brady Statistic AS VP Percent: 81 %
Implantable Lead Implant Date: 20140421
Implantable Lead Model: 5592
Implantable Pulse Generator Implant Date: 20140421
Lead Channel Impedance Value: 523 Ohm
Lead Channel Pacing Threshold Amplitude: 0.375 V
Lead Channel Pacing Threshold Amplitude: 0.5 V
Lead Channel Setting Pacing Amplitude: 1.5 V
Lead Channel Setting Pacing Pulse Width: 0.4 ms
MDC IDC LEAD IMPLANT DT: 20140421
MDC IDC LEAD LOCATION: 753859
MDC IDC LEAD LOCATION: 753860
MDC IDC MSMT BATTERY IMPEDANCE: 253 Ohm
MDC IDC MSMT LEADCHNL RA PACING THRESHOLD PULSEWIDTH: 0.4 ms
MDC IDC MSMT LEADCHNL RV IMPEDANCE VALUE: 776 Ohm
MDC IDC MSMT LEADCHNL RV PACING THRESHOLD PULSEWIDTH: 0.4 ms
MDC IDC SESS DTM: 20180226144357
MDC IDC SET LEADCHNL RV PACING AMPLITUDE: 2 V
MDC IDC SET LEADCHNL RV SENSING SENSITIVITY: 5.6 mV
MDC IDC STAT BRADY AP VS PERCENT: 0 %
MDC IDC STAT BRADY AS VS PERCENT: 4 %

## 2016-10-22 DIAGNOSIS — E669 Obesity, unspecified: Secondary | ICD-10-CM | POA: Diagnosis not present

## 2016-10-22 DIAGNOSIS — Z6830 Body mass index (BMI) 30.0-30.9, adult: Secondary | ICD-10-CM | POA: Diagnosis not present

## 2016-10-22 DIAGNOSIS — M0579 Rheumatoid arthritis with rheumatoid factor of multiple sites without organ or systems involvement: Secondary | ICD-10-CM | POA: Diagnosis not present

## 2016-10-22 DIAGNOSIS — M2550 Pain in unspecified joint: Secondary | ICD-10-CM | POA: Diagnosis not present

## 2016-10-22 DIAGNOSIS — Z79899 Other long term (current) drug therapy: Secondary | ICD-10-CM | POA: Diagnosis not present

## 2016-10-24 DIAGNOSIS — M0579 Rheumatoid arthritis with rheumatoid factor of multiple sites without organ or systems involvement: Secondary | ICD-10-CM | POA: Diagnosis not present

## 2016-11-18 ENCOUNTER — Other Ambulatory Visit (HOSPITAL_COMMUNITY): Payer: Self-pay | Admitting: Pulmonary Disease

## 2016-11-18 ENCOUNTER — Encounter: Payer: Self-pay | Admitting: Family Medicine

## 2016-11-18 DIAGNOSIS — I10 Essential (primary) hypertension: Secondary | ICD-10-CM | POA: Diagnosis not present

## 2016-11-18 DIAGNOSIS — M5416 Radiculopathy, lumbar region: Secondary | ICD-10-CM

## 2016-11-18 DIAGNOSIS — M069 Rheumatoid arthritis, unspecified: Secondary | ICD-10-CM | POA: Diagnosis not present

## 2016-11-18 DIAGNOSIS — M79604 Pain in right leg: Secondary | ICD-10-CM | POA: Diagnosis not present

## 2016-11-18 DIAGNOSIS — E119 Type 2 diabetes mellitus without complications: Secondary | ICD-10-CM | POA: Diagnosis not present

## 2016-12-02 ENCOUNTER — Ambulatory Visit (HOSPITAL_COMMUNITY)
Admission: RE | Admit: 2016-12-02 | Discharge: 2016-12-02 | Disposition: A | Discharge status: Home / Self Care | Payer: Medicare Other | Source: Ambulatory Visit | Attending: Pulmonary Disease | Admitting: Pulmonary Disease

## 2016-12-02 DIAGNOSIS — M5126 Other intervertebral disc displacement, lumbar region: Secondary | ICD-10-CM | POA: Insufficient documentation

## 2016-12-02 DIAGNOSIS — M48061 Spinal stenosis, lumbar region without neurogenic claudication: Secondary | ICD-10-CM | POA: Insufficient documentation

## 2016-12-02 DIAGNOSIS — M5410 Radiculopathy, site unspecified: Secondary | ICD-10-CM | POA: Diagnosis present

## 2016-12-02 DIAGNOSIS — I700 Atherosclerosis of aorta: Secondary | ICD-10-CM | POA: Diagnosis not present

## 2016-12-02 DIAGNOSIS — M5416 Radiculopathy, lumbar region: Secondary | ICD-10-CM

## 2016-12-09 ENCOUNTER — Other Ambulatory Visit: Payer: Self-pay | Admitting: Pulmonary Disease

## 2016-12-09 DIAGNOSIS — G8929 Other chronic pain: Secondary | ICD-10-CM

## 2016-12-09 DIAGNOSIS — M545 Low back pain: Principal | ICD-10-CM

## 2016-12-10 ENCOUNTER — Telehealth: Payer: Self-pay | Admitting: Internal Medicine

## 2016-12-10 NOTE — Telephone Encounter (Signed)
Pt lvm stating he's going to have steroid injections done in his back at [facility] and he's needing clearance from Dr. Lovena Le

## 2016-12-10 NOTE — Telephone Encounter (Signed)
Will forward to Dr Lovena Le regarding Xarelto

## 2016-12-11 ENCOUNTER — Telehealth: Payer: Self-pay | Admitting: Internal Medicine

## 2016-12-11 ENCOUNTER — Telehealth: Payer: Self-pay | Admitting: Pharmacist

## 2016-12-11 NOTE — Telephone Encounter (Signed)
Pt called yesterday and today regarding a surgical clearance for an steroid injection. Pt needs to be  off  Xarelto 20 mg daily for one day. According to pt  form was send to the [place] office. Pt wants for that form to be signed ASAP, because he is in a lot of pain. Pt states he has an appointment with [facility] this coming Friday. He would like to have the form signed. I spoke with Hanna,she states that Dr Lovena Le will be at the [facility] office tomorrow. Pt is aware that MD will be in [place] tomorrow. Pt will call [facility] to fax another clearance form to the [place] office.

## 2016-12-11 NOTE — Telephone Encounter (Signed)
Received request for clearance for Xarelto for lumbar epidural from [place] [facility]. Pt on Xarelto for Afib with CHADS <4 (HTN, DM) and no history of stroke. Per protocol hold 72 hours prior to procedure. Faxed back to number provided [phone]

## 2016-12-11 NOTE — Telephone Encounter (Signed)
Clearance received.

## 2016-12-11 NOTE — Telephone Encounter (Signed)
New Message  Pt voiced he's been in pain for a week now and can hardly walk.  Pt voiced we need to have paper signed to [facility] to get shot in back.

## 2016-12-12 NOTE — Telephone Encounter (Signed)
Clearance faxed

## 2016-12-13 ENCOUNTER — Ambulatory Visit
Admission: RE | Admit: 2016-12-13 | Discharge: 2016-12-13 | Disposition: A | Discharge status: Home / Self Care | Payer: Medicare Other | Source: Ambulatory Visit | Attending: Pulmonary Disease | Admitting: Pulmonary Disease

## 2016-12-13 DIAGNOSIS — M48061 Spinal stenosis, lumbar region without neurogenic claudication: Secondary | ICD-10-CM | POA: Diagnosis not present

## 2016-12-13 DIAGNOSIS — M545 Low back pain: Principal | ICD-10-CM

## 2016-12-13 DIAGNOSIS — G8929 Other chronic pain: Secondary | ICD-10-CM

## 2016-12-13 MED ORDER — METHYLPREDNISOLONE ACETATE 40 MG/ML INJ SUSP (RADIOLOG
120.0000 mg | Freq: Once | INTRAMUSCULAR | Status: AC
  Administered 2016-12-13: 120 mg via EPIDURAL

## 2016-12-13 MED ORDER — IOPAMIDOL (ISOVUE-M 200) INJECTION 41%
1.0000 mL | Freq: Once | INTRAMUSCULAR | Status: AC
  Administered 2016-12-13: 1 mL via EPIDURAL

## 2016-12-13 NOTE — Discharge Instructions (Signed)

## 2016-12-18 DIAGNOSIS — M0579 Rheumatoid arthritis with rheumatoid factor of multiple sites without organ or systems involvement: Secondary | ICD-10-CM | POA: Diagnosis not present

## 2016-12-24 ENCOUNTER — Ambulatory Visit (INDEPENDENT_AMBULATORY_CARE_PROVIDER_SITE_OTHER): Payer: Medicare Other | Admitting: *Deleted

## 2016-12-24 ENCOUNTER — Telehealth: Payer: Self-pay | Admitting: Cardiology

## 2016-12-24 DIAGNOSIS — I480 Paroxysmal atrial fibrillation: Secondary | ICD-10-CM

## 2016-12-24 DIAGNOSIS — I442 Atrioventricular block, complete: Secondary | ICD-10-CM

## 2016-12-24 NOTE — Telephone Encounter (Signed)
Spoke with pt and reminded pt of remote transmission that is due today. Pt verbalized understanding.   

## 2016-12-25 LAB — CUP PACEART REMOTE DEVICE CHECK
Battery Voltage: 2.79 V
Brady Statistic AS VS Percent: 3 %
Date Time Interrogation Session: 20180529152533
Implantable Lead Implant Date: 20140421
Implantable Lead Implant Date: 20140421
Lead Channel Impedance Value: 714 Ohm
Lead Channel Pacing Threshold Amplitude: 0.5 V
Lead Channel Pacing Threshold Pulse Width: 0.4 ms
Lead Channel Setting Pacing Amplitude: 1.5 V
Lead Channel Setting Sensing Sensitivity: 5.6 mV
MDC IDC LEAD LOCATION: 753859
MDC IDC LEAD LOCATION: 753860
MDC IDC MSMT BATTERY IMPEDANCE: 253 Ohm
MDC IDC MSMT BATTERY REMAINING LONGEVITY: 118 mo
MDC IDC MSMT LEADCHNL RA IMPEDANCE VALUE: 546 Ohm
MDC IDC MSMT LEADCHNL RA PACING THRESHOLD AMPLITUDE: 0.375 V
MDC IDC MSMT LEADCHNL RA PACING THRESHOLD PULSEWIDTH: 0.4 ms
MDC IDC PG IMPLANT DT: 20140421
MDC IDC SET LEADCHNL RV PACING AMPLITUDE: 2 V
MDC IDC SET LEADCHNL RV PACING PULSEWIDTH: 0.4 ms
MDC IDC STAT BRADY AP VP PERCENT: 15 %
MDC IDC STAT BRADY AP VS PERCENT: 0 %
MDC IDC STAT BRADY AS VP PERCENT: 81 %

## 2017-01-03 ENCOUNTER — Encounter: Payer: Self-pay | Admitting: Cardiology

## 2017-01-17 ENCOUNTER — Encounter: Payer: Self-pay | Admitting: Cardiology

## 2017-01-23 ENCOUNTER — Other Ambulatory Visit: Payer: Self-pay | Admitting: Pulmonary Disease

## 2017-01-23 DIAGNOSIS — M2550 Pain in unspecified joint: Secondary | ICD-10-CM | POA: Diagnosis not present

## 2017-01-23 DIAGNOSIS — M5417 Radiculopathy, lumbosacral region: Secondary | ICD-10-CM | POA: Diagnosis not present

## 2017-01-23 DIAGNOSIS — M0579 Rheumatoid arthritis with rheumatoid factor of multiple sites without organ or systems involvement: Secondary | ICD-10-CM | POA: Diagnosis not present

## 2017-01-23 DIAGNOSIS — Z6829 Body mass index (BMI) 29.0-29.9, adult: Secondary | ICD-10-CM | POA: Diagnosis not present

## 2017-01-23 DIAGNOSIS — Z79899 Other long term (current) drug therapy: Secondary | ICD-10-CM | POA: Diagnosis not present

## 2017-01-23 DIAGNOSIS — E663 Overweight: Secondary | ICD-10-CM | POA: Diagnosis not present

## 2017-01-23 DIAGNOSIS — M545 Low back pain: Principal | ICD-10-CM

## 2017-01-23 DIAGNOSIS — G8929 Other chronic pain: Secondary | ICD-10-CM

## 2017-02-11 ENCOUNTER — Ambulatory Visit
Admission: RE | Admit: 2017-02-11 | Discharge: 2017-02-11 | Disposition: A | Discharge status: Home / Self Care | Payer: Medicare Other | Source: Ambulatory Visit | Attending: Pulmonary Disease | Admitting: Pulmonary Disease

## 2017-02-11 ENCOUNTER — Other Ambulatory Visit: Payer: Self-pay | Admitting: Pulmonary Disease

## 2017-02-11 DIAGNOSIS — M545 Low back pain: Principal | ICD-10-CM

## 2017-02-11 DIAGNOSIS — G8929 Other chronic pain: Secondary | ICD-10-CM

## 2017-02-11 MED ORDER — METHYLPREDNISOLONE ACETATE 40 MG/ML INJ SUSP (RADIOLOG
120.0000 mg | Freq: Once | INTRAMUSCULAR | Status: AC
  Administered 2017-02-11: 120 mg via EPIDURAL

## 2017-02-11 MED ORDER — IOPAMIDOL (ISOVUE-M 200) INJECTION 41%
1.0000 mL | Freq: Once | INTRAMUSCULAR | Status: AC
  Administered 2017-02-11: 1 mL via EPIDURAL

## 2017-02-11 NOTE — Discharge Instructions (Signed)

## 2017-02-12 DIAGNOSIS — M0579 Rheumatoid arthritis with rheumatoid factor of multiple sites without organ or systems involvement: Secondary | ICD-10-CM | POA: Diagnosis not present

## 2017-03-25 ENCOUNTER — Ambulatory Visit (INDEPENDENT_AMBULATORY_CARE_PROVIDER_SITE_OTHER): Payer: Medicare Other | Admitting: *Deleted

## 2017-03-25 DIAGNOSIS — I442 Atrioventricular block, complete: Secondary | ICD-10-CM

## 2017-03-25 NOTE — Progress Notes (Signed)
Remote pacemaker transmission.   

## 2017-03-26 LAB — CUP PACEART REMOTE DEVICE CHECK
Brady Statistic AP VS Percent: 0 %
Brady Statistic AS VP Percent: 80 %
Brady Statistic AS VS Percent: 5 %
Date Time Interrogation Session: 20180828191817
Implantable Lead Implant Date: 20140421
Implantable Lead Location: 753859
Implantable Lead Location: 753860
Lead Channel Impedance Value: 610 Ohm
Lead Channel Pacing Threshold Pulse Width: 0.4 ms
Lead Channel Setting Pacing Amplitude: 1.5 V
Lead Channel Setting Pacing Amplitude: 2 V
Lead Channel Setting Pacing Pulse Width: 0.4 ms
Lead Channel Setting Sensing Sensitivity: 5.6 mV
MDC IDC LEAD IMPLANT DT: 20140421
MDC IDC MSMT BATTERY IMPEDANCE: 277 Ohm
MDC IDC MSMT BATTERY REMAINING LONGEVITY: 118 mo
MDC IDC MSMT BATTERY VOLTAGE: 2.79 V
MDC IDC MSMT LEADCHNL RA PACING THRESHOLD AMPLITUDE: 0.375 V
MDC IDC MSMT LEADCHNL RA PACING THRESHOLD PULSEWIDTH: 0.4 ms
MDC IDC MSMT LEADCHNL RV IMPEDANCE VALUE: 817 Ohm
MDC IDC MSMT LEADCHNL RV PACING THRESHOLD AMPLITUDE: 0.5 V
MDC IDC PG IMPLANT DT: 20140421
MDC IDC STAT BRADY AP VP PERCENT: 15 %

## 2017-04-04 ENCOUNTER — Encounter: Payer: Self-pay | Admitting: Cardiology

## 2017-04-10 ENCOUNTER — Ambulatory Visit (INDEPENDENT_AMBULATORY_CARE_PROVIDER_SITE_OTHER): Payer: Medicare Other | Admitting: Internal Medicine

## 2017-04-10 ENCOUNTER — Encounter: Payer: Self-pay | Admitting: Internal Medicine

## 2017-04-10 DIAGNOSIS — I480 Paroxysmal atrial fibrillation: Secondary | ICD-10-CM

## 2017-04-10 NOTE — Progress Notes (Signed)
HPI [name] returns today for ongoing evaluation and management of his PAF, CHB, s/p PPM insertion. In the interim, he denies chest pain or sob. He has not been hospitalized. He does not like taking xarelto. He does not have palpitations. No Known Allergies   Current Outpatient Prescriptions  Medication Sig Dispense Refill  . Cinnamon 500 MG capsule Take 1,000 mg by mouth daily.    Marland Kitchen diltiazem (TIAZAC) 360 MG 24 hr capsule TAKE 1 CAPSULE BY MOUTH DAILY 90 capsule 3  . InFLIXimab (REMICADE IV) Inject into the vein.    Marland Kitchen losartan-hydrochlorothiazide (HYZAAR) 100-12.5 MG per tablet Take 1 tablet by mouth daily.    . methotrexate (RHEUMATREX) 2.5 MG tablet Take 2.5 mg by mouth once a week. Caution:Chemotherapy. Protect from light.    . Multiple Vitamins-Minerals (MEGA MULTIVITAMIN FOR MEN PO) Take by mouth.    Marland Kitchen omeprazole (PRILOSEC) 20 MG capsule Take 20 mg by mouth daily.    . vitamin B-12 (CYANOCOBALAMIN) 1000 MCG tablet Take 1,000 mcg by mouth daily.    Alveda Reasons 20 MG TABS tablet TAKE 1 TABLET BY MOUTH DAILY WITH SUPPER. 90 tablet 3   No current facility-administered medications for this visit.      Past Medical History:  Diagnosis Date  . A-fib ([place])   . Arthritis    RA  . Chest pain   . Dyspnea   . GERD (gastroesophageal reflux disease)   . Pacemaker    CHB, PAF  . Pre-diabetes   . Smoking     ROS:   All systems reviewed and negative except as noted in the HPI.   Past Surgical History:  Procedure Laterality Date  . CHOLECYSTECTOMY    . INGUINAL HERNIA REPAIR Bilateral 04/24/2016   Procedure: LAPAROSCOPIC BILATERAL INGUINAL HERNIA REPAIR;  Surgeon: Coralie Keens, MD;  Location: [place];  Service: General;  Laterality: Bilateral;  . INSERTION OF MESH Bilateral 04/24/2016   Procedure: INSERTION OF MESH;  Surgeon: Coralie Keens, MD;  Location: [facility];  Service: General;  Laterality: Bilateral;  . SHOULDER  ARTHROSCOPY Right    x2     No family history on file.   Social History   Social History  . Marital status: Married    Spouse name: N/A  . Number of children: N/A  . Years of education: N/A   Occupational History  . Not on file.   Social History Main Topics  . Smoking status: Current Every Day Smoker    Packs/day: 0.50  . Smokeless tobacco: Never Used  . Alcohol use Yes     Comment: social  . Drug use: No  . Sexual activity: Not on file   Other Topics Concern  . Not on file   Social History Narrative  . No narrative on file     BP (!) 118/58   Pulse 84   Wt 184 lb (83.5 kg)   SpO2 97%   BMI 29.70 kg/m   Physical Exam:  Well appearing 66 yo man, NAD HEENT: Unremarkable Neck:  6 cm JVD, no thyromegally Lymphatics:  No adenopathy Back:  No CVA tenderness Lungs:  Clear, with no wheezes HEART:  Regular rate rhythm, no murmurs, no rubs, no clicks Abd:  soft, positive bowel sounds, no organomegally, no rebound, no guarding Ext:  2 plus pulses, no edema, no cyanosis, no clubbing Skin:  No rashes no nodules Neuro:  CN II through XII intact, motor grossly intact  DEVICE  Normal device function.  See PaceArt for details.   Assess/Plan: 1. PAF - he is in rhythm 98% of the time. He will continue his current meds. I discussed the importance of Xarelto for stroke prevention. 2. CHB - he is s/p PPM and is doing well. Now asymptomatic. 3. HTN - his blood pressure is well controlled. He is encouraged to continue his calcium channel blocker. 4. 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 Will recheck in several months.   Mikle Bosworth.D.

## 2017-04-10 NOTE — Patient Instructions (Signed)
Medication Instructions:  Your physician recommends that you continue on your current medications as directed. Please refer to the Current Medication list given to you today.   Labwork: NONE  Testing/Procedures: NONE  Follow-Up: Your physician wants you to follow-up in: 1 Year with Dr. Lovena Le.  You will receive a reminder letter in the mail two months in advance. If you don't receive a letter, please call our office to schedule the follow-up appointment.  Remote monitoring is used to monitor your Pacemaker of ICD from home. This monitoring reduces the number of office visits required to check your device to one time per year. It allows [place] to keep an eye on the functioning of your device to ensure it is working properly. You are scheduled for a device check from home on 06/24/17. You may send your transmission at any time that day. If you have a wireless device, the transmission will be sent automatically. After your physician reviews your transmission, you will receive a postcard with your next transmission date.    Any Other Special Instructions Will Be Listed Below (If Applicable).     If you need a refill on your cardiac medications before your next appointment, please call your pharmacy.  Thank you for choosing [facility]!

## 2017-04-13 ENCOUNTER — Encounter (HOSPITAL_COMMUNITY): Payer: Self-pay | Admitting: *Deleted

## 2017-04-13 ENCOUNTER — Emergency Department (HOSPITAL_COMMUNITY): Payer: Medicare Other

## 2017-04-13 ENCOUNTER — Emergency Department (HOSPITAL_COMMUNITY)
Admission: EM | Admit: 2017-04-13 | Discharge: 2017-04-14 | Disposition: A | Discharge status: Home / Self Care | Payer: Medicare Other | Attending: Emergency Medicine | Admitting: Emergency Medicine

## 2017-04-13 DIAGNOSIS — M1611 Unilateral primary osteoarthritis, right hip: Secondary | ICD-10-CM | POA: Diagnosis not present

## 2017-04-13 DIAGNOSIS — Z79899 Other long term (current) drug therapy: Secondary | ICD-10-CM | POA: Insufficient documentation

## 2017-04-13 DIAGNOSIS — M545 Low back pain: Secondary | ICD-10-CM | POA: Diagnosis not present

## 2017-04-13 DIAGNOSIS — M25551 Pain in right hip: Secondary | ICD-10-CM | POA: Diagnosis not present

## 2017-04-13 DIAGNOSIS — M5116 Intervertebral disc disorders with radiculopathy, lumbar region: Secondary | ICD-10-CM | POA: Diagnosis not present

## 2017-04-13 DIAGNOSIS — F17210 Nicotine dependence, cigarettes, uncomplicated: Secondary | ICD-10-CM | POA: Insufficient documentation

## 2017-04-13 DIAGNOSIS — Z7901 Long term (current) use of anticoagulants: Secondary | ICD-10-CM | POA: Diagnosis not present

## 2017-04-13 DIAGNOSIS — M5136 Other intervertebral disc degeneration, lumbar region: Secondary | ICD-10-CM | POA: Diagnosis not present

## 2017-04-13 DIAGNOSIS — M4726 Other spondylosis with radiculopathy, lumbar region: Secondary | ICD-10-CM | POA: Insufficient documentation

## 2017-04-13 DIAGNOSIS — E119 Type 2 diabetes mellitus without complications: Secondary | ICD-10-CM | POA: Insufficient documentation

## 2017-04-13 DIAGNOSIS — I10 Essential (primary) hypertension: Secondary | ICD-10-CM | POA: Diagnosis not present

## 2017-04-13 HISTORY — DX: Radiculopathy, lumbar region: M54.16

## 2017-04-13 HISTORY — DX: Dorsalgia, unspecified: M54.9

## 2017-04-13 HISTORY — DX: Other chronic pain: G89.29

## 2017-04-13 MED ORDER — HYDROMORPHONE HCL 1 MG/ML IJ SOLN
1.0000 mg | Freq: Once | INTRAMUSCULAR | Status: AC
  Administered 2017-04-13: 1 mg via INTRAMUSCULAR
  Filled 2017-04-13: qty 1

## 2017-04-13 MED ORDER — PROMETHAZINE HCL 12.5 MG PO TABS
12.5000 mg | ORAL_TABLET | Freq: Once | ORAL | Status: AC
  Administered 2017-04-13: 12.5 mg via ORAL
  Filled 2017-04-13: qty 1

## 2017-04-13 MED ORDER — DEXAMETHASONE SODIUM PHOSPHATE 10 MG/ML IJ SOLN
10.0000 mg | Freq: Once | INTRAMUSCULAR | Status: AC
  Administered 2017-04-13: 10 mg via INTRAMUSCULAR
  Filled 2017-04-13: qty 1

## 2017-04-13 NOTE — ED Provider Notes (Signed)
[facility] DEPT Provider Note   CSN: [id number] Arrival date & time: 04/13/17  2117     History   Chief Complaint Chief Complaint  Patient presents with  . Back Pain    HPI Adam Watts is a 66 y.o. male.  Patient is a 66 year old male who presents to the emergency department with a complaint of lower back pain.  Patient has a history of rheumatoid arthritis. He has a history of degenerative disc disease as well as arthritis involving his back. The patient states that he's been climbing a ladder recently been doing some work around the house. He now has increasing pain of his right lower back as well as his hip. He states that it is now difficult for him to walk. He is not had any loss of bowel or bladder function. He has no numbness in the saddle area. No recent operations or procedures involving the hip or the lower back area.   The history is provided by the patient.  Back Pain   Pertinent negatives include no chest pain, no abdominal pain and no dysuria.    Past Medical History:  Diagnosis Date  . A-fib ([place])   . Arthritis    RA  . Chest pain   . Chronic back pain   . Dyspnea   . GERD (gastroesophageal reflux disease)   . Lumbar radiculopathy   . Pacemaker    CHB, PAF  . Pre-diabetes   . Smoking     Patient Active Problem List   Diagnosis Date Noted  . Atrial fibrillation ([place]) 08/01/2014  . Chest pain 06/14/2013  . Smoking 06/14/2013  . HTN (hypertension) 06/10/2013  . Pacemaker 06/10/2013  . DM (diabetes mellitus) ([place]) 06/10/2013    Past Surgical History:  Procedure Laterality Date  . CHOLECYSTECTOMY    . INGUINAL HERNIA REPAIR Bilateral 04/24/2016   Procedure: LAPAROSCOPIC BILATERAL INGUINAL HERNIA REPAIR;  Surgeon: Coralie Keens, MD;  Location: [facility];  Service: General;  Laterality: Bilateral;  . INSERTION OF MESH Bilateral 04/24/2016   Procedure: INSERTION OF MESH;  Surgeon: Coralie Keens, MD;  Location: [facility];  Service: General;  Laterality: Bilateral;  . SHOULDER ARTHROSCOPY Right    x2       Home Medications    Prior to Admission medications   Medication Sig Start Date End Date Taking? Authorizing Provider  Cinnamon 500 MG capsule Take 1,000 mg by mouth daily.   Yes [provider]  diltiazem (TIAZAC) 360 MG 24 hr capsule TAKE 1 CAPSULE BY MOUTH DAILY 08/05/16  Yes Evans Lance, MD  folic acid (FOLVITE) 694 MCG tablet Take 1,600 mcg by mouth daily.   Yes [provider]  InFLIXimab (REMICADE IV) Inject into the vein every 8 (eight) weeks.    Yes [provider]  losartan-hydrochlorothiazide (HYZAAR) 100-12.5 MG per tablet Take 1 tablet by mouth daily.   Yes [provider]  methotrexate (50 MG/ML) 1 g injection Inject into the vein every Monday. 0.5cc every Monday   Yes [provider]  Multiple Vitamins-Minerals (MEGA MULTIVITAMIN FOR MEN PO) Take by mouth.   Yes [provider]  omeprazole (PRILOSEC) 20 MG capsule Take 20 mg by mouth daily.   Yes [provider]  vitamin B-12 (CYANOCOBALAMIN) 1000 MCG tablet Take 1,000 mcg by mouth daily.   Yes [provider]  XARELTO 20 MG TABS tablet TAKE 1 TABLET BY MOUTH DAILY WITH SUPPER. 04/16/16  Yes Evans Lance,  MD    Family History No family history on file.  Social History Social History  Substance Use Topics  . Smoking status: Current Every Day Smoker    Packs/day: 0.50  . Smokeless tobacco: Never Used  . Alcohol use Yes     Comment: social     Allergies   Patient has no known allergies.   Review of Systems Review of Systems  Constitutional: Negative for activity change.       All ROS Neg except as noted in HPI  HENT: Negative for nosebleeds.   Eyes: Negative for photophobia and discharge.  Respiratory: Negative for cough, shortness of breath and wheezing.   Cardiovascular: Negative for chest pain and palpitations.    Gastrointestinal: Negative for abdominal pain and blood in stool.  Genitourinary: Negative for dysuria, frequency and hematuria.  Musculoskeletal: Positive for arthralgias and back pain. Negative for neck pain.  Skin: Negative.   Neurological: Negative for dizziness, seizures and speech difficulty.  Psychiatric/Behavioral: Negative for confusion and hallucinations.     Physical Exam Updated Vital Signs BP 111/63 (BP Location: Right Arm)   Pulse 99   Temp 98.9 F (37.2 C) (Oral)   Resp 18   Ht 5\' 6"  (1.676 m)   Wt 83.5 kg (184 lb)   SpO2 96%   BMI 29.70 kg/m   Physical Exam  Constitutional: He is oriented to person, place, and time. He appears well-developed and well-nourished.  Non-toxic appearance.  HENT:  Head: Normocephalic.  Right Ear: Tympanic membrane and external ear normal.  Left Ear: Tympanic membrane and external ear normal.  Eyes: Pupils are equal, round, and reactive to light. EOM and lids are normal.  Neck: Normal range of motion. Neck supple. Carotid bruit is not present.  Cardiovascular: Normal rate, regular rhythm, normal heart sounds, intact distal pulses and normal pulses.   Pulmonary/Chest: Breath sounds normal. No respiratory distress.  Abdominal: Soft. Bowel sounds are normal. There is no tenderness. There is no guarding.  Musculoskeletal:       Right hip: He exhibits decreased range of motion and tenderness.       Lumbar back: He exhibits decreased range of motion and pain.  Lymphadenopathy:       Head (right side): No submandibular adenopathy present.       Head (left side): No submandibular adenopathy present.    He has no cervical adenopathy.  Neurological: He is alert and oriented to person, place, and time. He has normal strength. No cranial nerve deficit or sensory deficit.  Skin: Skin is warm and dry.  Psychiatric: He has a normal mood and affect. His speech is normal.  Nursing note and vitals reviewed.    ED Treatments / Results   Labs (all labs ordered are listed, but only abnormal results are displayed) Labs Reviewed - No data to display  EKG  EKG Interpretation None       Radiology No results found.  Procedures Procedures (including critical care time)  Medications Ordered in ED Medications  HYDROmorphone (DILAUDID) injection 1 mg (1 mg Intramuscular Given 04/13/17 2233)  promethazine (PHENERGAN) tablet 12.5 mg (12.5 mg Oral Given 04/13/17 2233)  dexamethasone (DECADRON) injection 10 mg (10 mg Intramuscular Given 04/13/17 2233)     Initial Impression / Assessment and Plan / ED Course  I have reviewed the triage vital signs and the nursing notes.  Pertinent labs & imaging results that were available during my care of the patient were reviewed by me and considered in my  medical decision making (see chart for details).      Final Clinical Impressions(s) / ED Diagnoses MDM Vital signs within normal limits. X-ray of the right hip and pelvis show degenerative changes in the right hip. CT scan of the lumbar spine shows no fracture appreciated. There is degenerative changes of the lumbar spine resulting in moderate canal stenosis at the L4-L5 area. There is foraminal narrowing at the L3-L4 through the L5-S1 area. There is moderate to severe neural narrowing at the L5-S1 area. There is also noted is advanced right sacroiliac osteoarthrosis with bridging present.  Patient received some relief from the intramuscular pain medication. I suspect the patient is having acute exacerbation of his osteoarthritis and possible rheumatoid arthritis as well as some degenerative disc disease. I've asked the patient to discuss his symptoms with Dr. Luan Pulling in with his rheumatology specialists so that a plan can be performed for him. Prescription for Decadron and Norco given to the patient.    Final diagnoses:  Primary osteoarthritis of right hip  DDD (degenerative disc disease), lumbar  Osteoarthritis of spine with  radiculopathy, lumbar region    New Prescriptions New Prescriptions   DEXAMETHASONE (DECADRON) 4 MG TABLET    Take 1 tablet (4 mg total) by mouth 2 (two) times daily with a meal.   HYDROCODONE-ACETAMINOPHEN (NORCO) 7.5-325 MG TABLET    Take 1 tablet by mouth every 4 (four) hours as needed.     Lily Kocher, PA-C 04/14/17 [id number]    Milton Ferguson, MD 04/16/17 1556

## 2017-04-13 NOTE — ED Triage Notes (Signed)
Pt c/o lower back, right hip pain that radiates to right groin area and into right leg, denies any injury, denies any urinary symptoms, pt does state that he has chronic back problems and has been climbing a ladder for the past few days,

## 2017-04-14 DIAGNOSIS — M1611 Unilateral primary osteoarthritis, right hip: Secondary | ICD-10-CM | POA: Diagnosis not present

## 2017-04-14 MED ORDER — HYDROCODONE-ACETAMINOPHEN 5-325 MG PO TABS
2.0000 | ORAL_TABLET | Freq: Once | ORAL | Status: AC
  Administered 2017-04-14: 2 via ORAL
  Filled 2017-04-14: qty 2

## 2017-04-14 MED ORDER — ONDANSETRON HCL 4 MG PO TABS
4.0000 mg | ORAL_TABLET | Freq: Once | ORAL | Status: AC
  Administered 2017-04-14: 4 mg via ORAL
  Filled 2017-04-14: qty 1

## 2017-04-14 MED ORDER — HYDROCODONE-ACETAMINOPHEN 7.5-325 MG PO TABS: 1.0000 | ORAL_TABLET | ORAL | 0 refills | Status: DC | PRN

## 2017-04-14 MED ORDER — DEXAMETHASONE 4 MG PO TABS: 4.0000 mg | ORAL_TABLET | Freq: Two times a day (BID) | ORAL | 0 refills | Status: DC

## 2017-04-14 NOTE — Discharge Instructions (Signed)
The x-ray of your hip and pelvis show advanced arthritis in your hip as well as in your sacroiliac joint. The CT scan of your lumbar spine shows arthritis in your spine, and some degenerative disc changes present as well. Heating pad to your back and hip area may be helpful. Please rest your back and hip over the next few days. Use Decadron 2 times daily with a meal. Use Norco for severe pain.This medication may cause drowsiness. Please do not drink, drive, or participate in activity that requires concentration while taking this medication. Please set up an appointment with Dr. Luan Pulling as soon as possible to discuss the increasing severity of your back and hip pain.

## 2017-04-22 ENCOUNTER — Other Ambulatory Visit: Payer: Self-pay | Admitting: Internal Medicine

## 2017-04-22 LAB — CUP PACEART INCLINIC DEVICE CHECK
Brady Statistic AP VS Percent: 0.1 %
Brady Statistic AS VP Percent: 79.7 %
Date Time Interrogation Session: 20180925163908
Implantable Lead Implant Date: 20140421
Implantable Lead Location: 753859
Implantable Lead Location: 753860
Implantable Lead Model: 5076
Lead Channel Impedance Value: 521 Ohm
Lead Channel Pacing Threshold Amplitude: 0.5 V
Lead Channel Pacing Threshold Amplitude: 0.5 V
Lead Channel Pacing Threshold Pulse Width: 0.4 ms
Lead Channel Sensing Intrinsic Amplitude: 15.68 mV
Lead Channel Sensing Intrinsic Amplitude: 4 mV
Lead Channel Setting Pacing Amplitude: 2 V
Lead Channel Setting Pacing Pulse Width: 0.4 ms
Lead Channel Setting Sensing Sensitivity: 5.6 mV
MDC IDC LEAD IMPLANT DT: 20140421
MDC IDC MSMT BATTERY IMPEDANCE: 301 Ohm
MDC IDC MSMT BATTERY REMAINING LONGEVITY: 114 mo
MDC IDC MSMT BATTERY VOLTAGE: 2.79 V
MDC IDC MSMT LEADCHNL RV IMPEDANCE VALUE: 820 Ohm
MDC IDC MSMT LEADCHNL RV PACING THRESHOLD PULSEWIDTH: 0.4 ms
MDC IDC PG IMPLANT DT: 20140421
MDC IDC SET LEADCHNL RA PACING AMPLITUDE: 1.5 V
MDC IDC STAT BRADY AP VP PERCENT: 14.4 %
MDC IDC STAT BRADY AS VS PERCENT: 5.8 %

## 2017-04-23 DIAGNOSIS — Z79899 Other long term (current) drug therapy: Secondary | ICD-10-CM | POA: Diagnosis not present

## 2017-04-23 DIAGNOSIS — M0579 Rheumatoid arthritis with rheumatoid factor of multiple sites without organ or systems involvement: Secondary | ICD-10-CM | POA: Diagnosis not present

## 2017-05-05 DIAGNOSIS — M069 Rheumatoid arthritis, unspecified: Secondary | ICD-10-CM | POA: Diagnosis not present

## 2017-05-05 DIAGNOSIS — M545 Low back pain: Secondary | ICD-10-CM | POA: Diagnosis not present

## 2017-05-05 DIAGNOSIS — E119 Type 2 diabetes mellitus without complications: Secondary | ICD-10-CM | POA: Diagnosis not present

## 2017-05-05 DIAGNOSIS — I10 Essential (primary) hypertension: Secondary | ICD-10-CM | POA: Diagnosis not present

## 2017-05-22 DIAGNOSIS — M5416 Radiculopathy, lumbar region: Secondary | ICD-10-CM | POA: Diagnosis not present

## 2017-05-26 ENCOUNTER — Other Ambulatory Visit: Payer: Self-pay | Admitting: Physician Assistant

## 2017-05-26 DIAGNOSIS — M5416 Radiculopathy, lumbar region: Secondary | ICD-10-CM

## 2017-06-05 ENCOUNTER — Ambulatory Visit
Admission: RE | Admit: 2017-06-05 | Discharge: 2017-06-05 | Disposition: A | Discharge status: Home / Self Care | Payer: Medicare Other | Source: Ambulatory Visit | Attending: Physician Assistant | Admitting: Physician Assistant

## 2017-06-05 DIAGNOSIS — M48061 Spinal stenosis, lumbar region without neurogenic claudication: Secondary | ICD-10-CM | POA: Diagnosis not present

## 2017-06-05 DIAGNOSIS — M5416 Radiculopathy, lumbar region: Secondary | ICD-10-CM

## 2017-06-05 MED ORDER — ONDANSETRON HCL 4 MG/2ML IJ SOLN: 4.0000 mg | Freq: Four times a day (QID) | INTRAMUSCULAR | Status: DC | PRN

## 2017-06-05 MED ORDER — MEPERIDINE HCL 100 MG/ML IJ SOLN
75.0000 mg | Freq: Once | INTRAMUSCULAR | Status: AC
  Administered 2017-06-05: 50 mg via INTRAMUSCULAR

## 2017-06-05 MED ORDER — HYDROXYZINE HCL 50 MG/ML IM SOLN
25.0000 mg | Freq: Once | INTRAMUSCULAR | Status: AC
  Administered 2017-06-05: 25 mg via INTRAMUSCULAR

## 2017-06-05 MED ORDER — DIAZEPAM 5 MG PO TABS
5.0000 mg | ORAL_TABLET | Freq: Once | ORAL | Status: AC
  Administered 2017-06-05: 5 mg via ORAL

## 2017-06-05 MED ORDER — IOPAMIDOL (ISOVUE-M 200) INJECTION 41%
15.0000 mL | Freq: Once | INTRAMUSCULAR | Status: AC
  Administered 2017-06-05: 15 mL via INTRATHECAL

## 2017-06-05 NOTE — Discharge Instructions (Signed)
Myelogram Discharge Instructions  1. Go home and rest quietly for the next 24 hours.  It is important to lie flat for the next 24 hours.  Get up only to go to the restroom.  You may lie in the bed or on a couch on your back, your stomach, your left side or your right side.  You may have one pillow under your head.  You may have pillows between your knees while you are on your side or under your knees while you are on your back.  2. DO NOT drive today.  Recline the seat as far back as it will go, while still wearing your seat belt, on the way home.  3. You may get up to go to the bathroom as needed.  You may sit up for 10 minutes to eat.  You may resume your normal diet and medications unless otherwise indicated.  Drink lots of extra fluids today and tomorrow.  4. The incidence of headache, nausea, or vomiting is about 5% (one in 20 patients).  If you develop a headache, lie flat and drink plenty of fluids until the headache goes away.  Caffeinated beverages may be helpful.  If you develop severe nausea and vomiting or a headache that does not go away with flat bed rest, call [phone].  5. You may resume normal activities after your 24 hours of bed rest is over; however, do not exert yourself strongly or do any heavy lifting tomorrow. If when you get up you have a headache when standing, go back to bed and force fluids for another 24 hours.  6. Call your physician for a follow-up appointment.  The results of your myelogram will be sent directly to your physician by the following day.  7. If you have any questions or if complications develop after you arrive home, please call [phone].  Discharge instructions have been explained to the patient.  The patient, or the person responsible for the patient, fully understands these instructions.       [facility] [place].

## 2017-06-05 NOTE — Progress Notes (Signed)
Patient states he has been off Xarelto for at least the past day.

## 2017-06-11 DIAGNOSIS — M4727 Other spondylosis with radiculopathy, lumbosacral region: Secondary | ICD-10-CM | POA: Diagnosis not present

## 2017-06-12 ENCOUNTER — Telehealth: Payer: Self-pay | Admitting: *Deleted

## 2017-06-12 NOTE — Telephone Encounter (Signed)
   [facility] Pre-operative Risk Assessment    Request for surgical clearance:  1. What type of surgery is being performed?  L4-5 L5-S1 Laminectomy with Foraminotomy   2. When is this surgery scheduled? 06/24/2017   3. Are there any medications that need to be held prior to surgery and how long? Xarelto. Will need to stop prior to surgery. Please advise   4. Practice name and name of physician performing surgery? Dr. Marland Kitchen Ditty. [facility]   5. What is your office phone and fax number?       Phone-[phone]     [phone]   6. Anesthesia type (None, local, MAC, general) ? Not noted.      _________________________________________________________________   (provider comments below)

## 2017-06-13 NOTE — Telephone Encounter (Signed)
Pt takes Xarelto for afib with CHADS2VASc score of 3 (age, HTN, DM). Renal function is normal. Ok to hold Xarelto for 3 days prior to spinal procedure per protocol. Clearance routed to below number.

## 2017-06-13 NOTE — Telephone Encounter (Signed)
    Chart reviewed as part of pre-operative protocol coverage. Patient was contacted 06/13/2017 in reference to pre-operative risk assessment for pending surgery as outlined below.  TERRELLE RUFFOLO was last seen on 04/10/2017 by Dr Lovena Le.  Since that day, WASHINGTON WHEDBEE has done well from a heart standpoint, but poorly because of his back. He is limited by his back, but has been able to do things such as climb steps, install a sink and disposal, and other things around the house.  He is supposed to wear a monitor, but cannot do it on the 27th, that is the day of the surgery.   Therefore, based on ACC/AHA guidelines, the patient would be at acceptable risk for the planned procedure without further cardiovascular testing.   I will route this recommendation to the Pharm pool to address the Xarelto. I will also route it to the device clinic to reschedule his device appt.   Then it will go to the requesting party via [place] fax function and remove from pre-op pool.  Please call with questions.  Rosaria Ferries, PA-C 06/13/2017, 3:22 PM

## 2017-06-13 NOTE — Telephone Encounter (Signed)
Adam Watts apt is a remote check from home he can complete that the day before or after surgery. He will not need to come into the clinic. I have contacted the patient and let him know

## 2017-06-16 NOTE — Telephone Encounter (Signed)
Clearance faxed to Dr Cyndy Freeze office via epic

## 2017-06-24 ENCOUNTER — Ambulatory Visit (INDEPENDENT_AMBULATORY_CARE_PROVIDER_SITE_OTHER): Payer: Medicare Other | Admitting: *Deleted

## 2017-06-24 DIAGNOSIS — M4726 Other spondylosis with radiculopathy, lumbar region: Secondary | ICD-10-CM | POA: Diagnosis not present

## 2017-06-24 DIAGNOSIS — I442 Atrioventricular block, complete: Secondary | ICD-10-CM

## 2017-06-24 DIAGNOSIS — M4807 Spinal stenosis, lumbosacral region: Secondary | ICD-10-CM | POA: Diagnosis not present

## 2017-06-24 DIAGNOSIS — M48061 Spinal stenosis, lumbar region without neurogenic claudication: Secondary | ICD-10-CM | POA: Diagnosis not present

## 2017-06-24 DIAGNOSIS — M4727 Other spondylosis with radiculopathy, lumbosacral region: Secondary | ICD-10-CM | POA: Diagnosis not present

## 2017-06-25 NOTE — Progress Notes (Signed)
Remote pacemaker transmission.   

## 2017-06-27 ENCOUNTER — Encounter: Payer: Self-pay | Admitting: Cardiology

## 2017-06-27 LAB — CUP PACEART REMOTE DEVICE CHECK
Battery Remaining Longevity: 117 mo
Brady Statistic AP VP Percent: 9 %
Brady Statistic AS VP Percent: 67 %
Date Time Interrogation Session: 20181126181559
Implantable Lead Implant Date: 20140421
Implantable Lead Location: 753859
Implantable Pulse Generator Implant Date: 20140421
Lead Channel Impedance Value: 845 Ohm
Lead Channel Pacing Threshold Amplitude: 0.375 V
Lead Channel Pacing Threshold Pulse Width: 0.4 ms
Lead Channel Sensing Intrinsic Amplitude: 16 mV
Lead Channel Setting Pacing Amplitude: 2 V
Lead Channel Setting Sensing Sensitivity: 5.6 mV
MDC IDC LEAD IMPLANT DT: 20140421
MDC IDC LEAD LOCATION: 753860
MDC IDC MSMT BATTERY IMPEDANCE: 301 Ohm
MDC IDC MSMT BATTERY VOLTAGE: 2.79 V
MDC IDC MSMT LEADCHNL RA IMPEDANCE VALUE: 506 Ohm
MDC IDC MSMT LEADCHNL RA SENSING INTR AMPL: 2.8 mV
MDC IDC MSMT LEADCHNL RV PACING THRESHOLD AMPLITUDE: 0.5 V
MDC IDC MSMT LEADCHNL RV PACING THRESHOLD PULSEWIDTH: 0.4 ms
MDC IDC SET LEADCHNL RA PACING AMPLITUDE: 1.5 V
MDC IDC SET LEADCHNL RV PACING PULSEWIDTH: 0.4 ms
MDC IDC STAT BRADY AP VS PERCENT: 5 %
MDC IDC STAT BRADY AS VS PERCENT: 19 %

## 2017-07-08 DIAGNOSIS — Z79899 Other long term (current) drug therapy: Secondary | ICD-10-CM | POA: Diagnosis not present

## 2017-07-08 DIAGNOSIS — M0579 Rheumatoid arthritis with rheumatoid factor of multiple sites without organ or systems involvement: Secondary | ICD-10-CM | POA: Diagnosis not present

## 2017-07-24 DIAGNOSIS — M545 Low back pain: Secondary | ICD-10-CM | POA: Diagnosis not present

## 2017-07-24 DIAGNOSIS — M5489 Other dorsalgia: Secondary | ICD-10-CM | POA: Diagnosis not present

## 2017-07-24 DIAGNOSIS — R262 Difficulty in walking, not elsewhere classified: Secondary | ICD-10-CM | POA: Diagnosis not present

## 2017-07-24 DIAGNOSIS — M6281 Muscle weakness (generalized): Secondary | ICD-10-CM | POA: Diagnosis not present

## 2017-07-25 DIAGNOSIS — M6281 Muscle weakness (generalized): Secondary | ICD-10-CM | POA: Diagnosis not present

## 2017-07-25 DIAGNOSIS — M5489 Other dorsalgia: Secondary | ICD-10-CM | POA: Diagnosis not present

## 2017-07-25 DIAGNOSIS — M545 Low back pain: Secondary | ICD-10-CM | POA: Diagnosis not present

## 2017-07-25 DIAGNOSIS — R262 Difficulty in walking, not elsewhere classified: Secondary | ICD-10-CM | POA: Diagnosis not present

## 2017-07-28 DIAGNOSIS — M545 Low back pain: Secondary | ICD-10-CM | POA: Diagnosis not present

## 2017-07-28 DIAGNOSIS — M6281 Muscle weakness (generalized): Secondary | ICD-10-CM | POA: Diagnosis not present

## 2017-07-28 DIAGNOSIS — R262 Difficulty in walking, not elsewhere classified: Secondary | ICD-10-CM | POA: Diagnosis not present

## 2017-07-28 DIAGNOSIS — M5489 Other dorsalgia: Secondary | ICD-10-CM | POA: Diagnosis not present

## 2017-07-30 DIAGNOSIS — M545 Low back pain: Secondary | ICD-10-CM | POA: Diagnosis not present

## 2017-07-30 DIAGNOSIS — R262 Difficulty in walking, not elsewhere classified: Secondary | ICD-10-CM | POA: Diagnosis not present

## 2017-07-30 DIAGNOSIS — M6281 Muscle weakness (generalized): Secondary | ICD-10-CM | POA: Diagnosis not present

## 2017-07-30 DIAGNOSIS — M5489 Other dorsalgia: Secondary | ICD-10-CM | POA: Diagnosis not present

## 2017-08-01 DIAGNOSIS — M545 Low back pain: Secondary | ICD-10-CM | POA: Diagnosis not present

## 2017-08-01 DIAGNOSIS — R262 Difficulty in walking, not elsewhere classified: Secondary | ICD-10-CM | POA: Diagnosis not present

## 2017-08-01 DIAGNOSIS — M5489 Other dorsalgia: Secondary | ICD-10-CM | POA: Diagnosis not present

## 2017-08-01 DIAGNOSIS — M6281 Muscle weakness (generalized): Secondary | ICD-10-CM | POA: Diagnosis not present

## 2017-08-04 DIAGNOSIS — R262 Difficulty in walking, not elsewhere classified: Secondary | ICD-10-CM | POA: Diagnosis not present

## 2017-08-04 DIAGNOSIS — M545 Low back pain: Secondary | ICD-10-CM | POA: Diagnosis not present

## 2017-08-04 DIAGNOSIS — M6281 Muscle weakness (generalized): Secondary | ICD-10-CM | POA: Diagnosis not present

## 2017-08-04 DIAGNOSIS — M5489 Other dorsalgia: Secondary | ICD-10-CM | POA: Diagnosis not present

## 2017-08-06 DIAGNOSIS — M545 Low back pain: Secondary | ICD-10-CM | POA: Diagnosis not present

## 2017-08-06 DIAGNOSIS — M5489 Other dorsalgia: Secondary | ICD-10-CM | POA: Diagnosis not present

## 2017-08-06 DIAGNOSIS — R262 Difficulty in walking, not elsewhere classified: Secondary | ICD-10-CM | POA: Diagnosis not present

## 2017-08-06 DIAGNOSIS — Z23 Encounter for immunization: Secondary | ICD-10-CM | POA: Diagnosis not present

## 2017-08-06 DIAGNOSIS — M6281 Muscle weakness (generalized): Secondary | ICD-10-CM | POA: Diagnosis not present

## 2017-08-08 DIAGNOSIS — M545 Low back pain: Secondary | ICD-10-CM | POA: Diagnosis not present

## 2017-08-08 DIAGNOSIS — M6281 Muscle weakness (generalized): Secondary | ICD-10-CM | POA: Diagnosis not present

## 2017-08-08 DIAGNOSIS — R262 Difficulty in walking, not elsewhere classified: Secondary | ICD-10-CM | POA: Diagnosis not present

## 2017-08-08 DIAGNOSIS — M5489 Other dorsalgia: Secondary | ICD-10-CM | POA: Diagnosis not present

## 2017-08-11 DIAGNOSIS — M545 Low back pain: Secondary | ICD-10-CM | POA: Diagnosis not present

## 2017-08-11 DIAGNOSIS — M5489 Other dorsalgia: Secondary | ICD-10-CM | POA: Diagnosis not present

## 2017-08-11 DIAGNOSIS — M6281 Muscle weakness (generalized): Secondary | ICD-10-CM | POA: Diagnosis not present

## 2017-08-11 DIAGNOSIS — R262 Difficulty in walking, not elsewhere classified: Secondary | ICD-10-CM | POA: Diagnosis not present

## 2017-08-13 DIAGNOSIS — M545 Low back pain: Secondary | ICD-10-CM | POA: Diagnosis not present

## 2017-08-13 DIAGNOSIS — R262 Difficulty in walking, not elsewhere classified: Secondary | ICD-10-CM | POA: Diagnosis not present

## 2017-08-13 DIAGNOSIS — M6281 Muscle weakness (generalized): Secondary | ICD-10-CM | POA: Diagnosis not present

## 2017-08-13 DIAGNOSIS — M5489 Other dorsalgia: Secondary | ICD-10-CM | POA: Diagnosis not present

## 2017-08-16 ENCOUNTER — Other Ambulatory Visit: Payer: Self-pay | Admitting: Internal Medicine

## 2017-08-18 DIAGNOSIS — R262 Difficulty in walking, not elsewhere classified: Secondary | ICD-10-CM | POA: Diagnosis not present

## 2017-08-18 DIAGNOSIS — M5489 Other dorsalgia: Secondary | ICD-10-CM | POA: Diagnosis not present

## 2017-08-18 DIAGNOSIS — M545 Low back pain: Secondary | ICD-10-CM | POA: Diagnosis not present

## 2017-08-18 DIAGNOSIS — M6281 Muscle weakness (generalized): Secondary | ICD-10-CM | POA: Diagnosis not present

## 2017-08-20 DIAGNOSIS — M545 Low back pain: Secondary | ICD-10-CM | POA: Diagnosis not present

## 2017-08-20 DIAGNOSIS — M5489 Other dorsalgia: Secondary | ICD-10-CM | POA: Diagnosis not present

## 2017-08-20 DIAGNOSIS — R262 Difficulty in walking, not elsewhere classified: Secondary | ICD-10-CM | POA: Diagnosis not present

## 2017-08-20 DIAGNOSIS — M6281 Muscle weakness (generalized): Secondary | ICD-10-CM | POA: Diagnosis not present

## 2017-08-25 DIAGNOSIS — R262 Difficulty in walking, not elsewhere classified: Secondary | ICD-10-CM | POA: Diagnosis not present

## 2017-08-25 DIAGNOSIS — M5489 Other dorsalgia: Secondary | ICD-10-CM | POA: Diagnosis not present

## 2017-08-25 DIAGNOSIS — M6281 Muscle weakness (generalized): Secondary | ICD-10-CM | POA: Diagnosis not present

## 2017-08-25 DIAGNOSIS — M545 Low back pain: Secondary | ICD-10-CM | POA: Diagnosis not present

## 2017-08-29 DIAGNOSIS — R262 Difficulty in walking, not elsewhere classified: Secondary | ICD-10-CM | POA: Diagnosis not present

## 2017-08-29 DIAGNOSIS — M5489 Other dorsalgia: Secondary | ICD-10-CM | POA: Diagnosis not present

## 2017-08-29 DIAGNOSIS — M545 Low back pain: Secondary | ICD-10-CM | POA: Diagnosis not present

## 2017-08-29 DIAGNOSIS — M6281 Muscle weakness (generalized): Secondary | ICD-10-CM | POA: Diagnosis not present

## 2017-09-02 DIAGNOSIS — Z79899 Other long term (current) drug therapy: Secondary | ICD-10-CM | POA: Diagnosis not present

## 2017-09-02 DIAGNOSIS — M0579 Rheumatoid arthritis with rheumatoid factor of multiple sites without organ or systems involvement: Secondary | ICD-10-CM | POA: Diagnosis not present

## 2017-09-23 ENCOUNTER — Ambulatory Visit (INDEPENDENT_AMBULATORY_CARE_PROVIDER_SITE_OTHER): Payer: Medicare Other | Admitting: *Deleted

## 2017-09-23 DIAGNOSIS — M0579 Rheumatoid arthritis with rheumatoid factor of multiple sites without organ or systems involvement: Secondary | ICD-10-CM | POA: Diagnosis not present

## 2017-09-23 DIAGNOSIS — I442 Atrioventricular block, complete: Secondary | ICD-10-CM | POA: Diagnosis not present

## 2017-09-23 DIAGNOSIS — E663 Overweight: Secondary | ICD-10-CM | POA: Diagnosis not present

## 2017-09-23 DIAGNOSIS — Z6828 Body mass index (BMI) 28.0-28.9, adult: Secondary | ICD-10-CM | POA: Diagnosis not present

## 2017-09-23 DIAGNOSIS — Z79899 Other long term (current) drug therapy: Secondary | ICD-10-CM | POA: Diagnosis not present

## 2017-09-23 DIAGNOSIS — M2550 Pain in unspecified joint: Secondary | ICD-10-CM | POA: Diagnosis not present

## 2017-09-23 DIAGNOSIS — M5417 Radiculopathy, lumbosacral region: Secondary | ICD-10-CM | POA: Diagnosis not present

## 2017-09-23 NOTE — Progress Notes (Signed)
Remote pacemaker transmission.   

## 2017-09-25 ENCOUNTER — Encounter: Payer: Self-pay | Admitting: Cardiology

## 2017-10-07 DIAGNOSIS — Z79899 Other long term (current) drug therapy: Secondary | ICD-10-CM | POA: Diagnosis not present

## 2017-10-07 DIAGNOSIS — M0579 Rheumatoid arthritis with rheumatoid factor of multiple sites without organ or systems involvement: Secondary | ICD-10-CM | POA: Diagnosis not present

## 2017-10-08 LAB — CUP PACEART REMOTE DEVICE CHECK
Battery Impedance: 350 Ohm
Brady Statistic AP VP Percent: 10 %
Brady Statistic AP VS Percent: 2 %
Brady Statistic AS VP Percent: 72 %
Brady Statistic AS VS Percent: 16 %
Implantable Lead Implant Date: 20140421
Implantable Lead Location: 753860
Implantable Lead Model: 5076
Implantable Lead Model: 5592
Implantable Pulse Generator Implant Date: 20140421
Lead Channel Impedance Value: 546 Ohm
Lead Channel Impedance Value: 691 Ohm
Lead Channel Pacing Threshold Amplitude: 0.375 V
Lead Channel Pacing Threshold Amplitude: 0.375 V
Lead Channel Setting Pacing Amplitude: 2 V
Lead Channel Setting Sensing Sensitivity: 5.6 mV
MDC IDC LEAD IMPLANT DT: 20140421
MDC IDC LEAD LOCATION: 753859
MDC IDC MSMT BATTERY REMAINING LONGEVITY: 108 mo
MDC IDC MSMT BATTERY VOLTAGE: 2.79 V
MDC IDC MSMT LEADCHNL RA PACING THRESHOLD PULSEWIDTH: 0.4 ms
MDC IDC MSMT LEADCHNL RV PACING THRESHOLD PULSEWIDTH: 0.4 ms
MDC IDC SESS DTM: 20190226135047
MDC IDC SET LEADCHNL RA PACING AMPLITUDE: 1.5 V
MDC IDC SET LEADCHNL RV PACING PULSEWIDTH: 0.4 ms

## 2017-10-21 DIAGNOSIS — M0579 Rheumatoid arthritis with rheumatoid factor of multiple sites without organ or systems involvement: Secondary | ICD-10-CM | POA: Diagnosis not present

## 2017-11-04 DIAGNOSIS — M0579 Rheumatoid arthritis with rheumatoid factor of multiple sites without organ or systems involvement: Secondary | ICD-10-CM | POA: Diagnosis not present

## 2017-11-25 DIAGNOSIS — M5417 Radiculopathy, lumbosacral region: Secondary | ICD-10-CM | POA: Diagnosis not present

## 2017-11-25 DIAGNOSIS — E663 Overweight: Secondary | ICD-10-CM | POA: Diagnosis not present

## 2017-11-25 DIAGNOSIS — M2550 Pain in unspecified joint: Secondary | ICD-10-CM | POA: Diagnosis not present

## 2017-11-25 DIAGNOSIS — Z79899 Other long term (current) drug therapy: Secondary | ICD-10-CM | POA: Diagnosis not present

## 2017-11-25 DIAGNOSIS — Z6829 Body mass index (BMI) 29.0-29.9, adult: Secondary | ICD-10-CM | POA: Diagnosis not present

## 2017-11-25 DIAGNOSIS — M0579 Rheumatoid arthritis with rheumatoid factor of multiple sites without organ or systems involvement: Secondary | ICD-10-CM | POA: Diagnosis not present

## 2017-12-03 DIAGNOSIS — Z79899 Other long term (current) drug therapy: Secondary | ICD-10-CM | POA: Diagnosis not present

## 2017-12-03 DIAGNOSIS — M0579 Rheumatoid arthritis with rheumatoid factor of multiple sites without organ or systems involvement: Secondary | ICD-10-CM | POA: Diagnosis not present

## 2017-12-15 DIAGNOSIS — M4727 Other spondylosis with radiculopathy, lumbosacral region: Secondary | ICD-10-CM | POA: Diagnosis not present

## 2017-12-15 DIAGNOSIS — I10 Essential (primary) hypertension: Secondary | ICD-10-CM | POA: Diagnosis not present

## 2017-12-15 DIAGNOSIS — Z6829 Body mass index (BMI) 29.0-29.9, adult: Secondary | ICD-10-CM | POA: Diagnosis not present

## 2017-12-18 ENCOUNTER — Other Ambulatory Visit (HOSPITAL_COMMUNITY): Payer: Self-pay | Admitting: Neurosurgery

## 2017-12-18 ENCOUNTER — Other Ambulatory Visit: Payer: Self-pay | Admitting: Neurosurgery

## 2017-12-18 DIAGNOSIS — M4727 Other spondylosis with radiculopathy, lumbosacral region: Secondary | ICD-10-CM

## 2017-12-18 MED ORDER — SODIUM CHLORIDE 0.9 % IV SOLN: 4.0000 mg | Freq: Four times a day (QID) | INTRAVENOUS | Status: AC | PRN

## 2017-12-19 ENCOUNTER — Telehealth: Payer: Self-pay

## 2017-12-19 NOTE — Telephone Encounter (Signed)
Sent to [place],

## 2017-12-19 NOTE — Telephone Encounter (Signed)
   [facility] Pre-operative Risk Assessment    Request for surgical clearance:  1. What type of surgery is being performed? Myelogram/Lumbar   2. When is this surgery scheduled? 01/05/2018   3. What type of clearance is required (medical clearance vs. Pharmacy clearance to hold med vs. Both)? Both  4. Are there any medications that need to be held prior to surgery and how long? Xarelto; how long not listed   5. Practice name and name of physician performing surgery? [facility]; Dr. Raliegh Ip. [place]  6. What is your office phone number? [phone]   7.   What is your office fax number? [phone] Davonna Belling.   8.   Anesthesia type (None, local, MAC, general) ? Not listed    Joaquim Lai 12/19/2017, 12:16 PM  _________________________________________________________________   (provider comments below)

## 2017-12-19 NOTE — Telephone Encounter (Signed)
Patient with diagnosis of Afib on xarelto for anticoagulation.    Procedure: myelogram Date of procedure: 01/05/18  CHADS2-VASc score of  3 (CHF, HTN, AGE, DM2, stroke/tia x 2, CAD, AGE, male)  CrCl 23ml/min   Per office protocol, patient can hold Xarelto for 3 days prior to procedure.

## 2017-12-24 ENCOUNTER — Ambulatory Visit (INDEPENDENT_AMBULATORY_CARE_PROVIDER_SITE_OTHER): Payer: Medicare Other | Admitting: *Deleted

## 2017-12-24 DIAGNOSIS — I442 Atrioventricular block, complete: Secondary | ICD-10-CM

## 2017-12-24 NOTE — Progress Notes (Signed)
Remote pacemaker transmission.   

## 2017-12-24 NOTE — Telephone Encounter (Signed)
   Primary Cardiologist: Cristopher Peru, MD  Chart reviewed as part of pre-operative protocol coverage. Patient was contacted 12/24/2017 in reference to pre-operative risk assessment for pending surgery as outlined below.  Adam Watts was last seen 03/2017 by Dr. Lovena Le.  Since that day, Adam Watts has done very well. He has h/o PAF, CHB s/p PPM, RA GERD, tobacco, pre-DM. 2D echo 2014 with normal LV function. Nuc 07/2013 felt low risk. Cr 1.04 in 2018. Revised cardiac risk index 0.4% indicating very low risk of adverse outcome with noncardiac procedure. He is able to complete over 7 METS without any angina or dyspnea (back pain is what limits more strenuous activity) - still active in the yard, housework and walking. Therefore, based on ACC/AHA guidelines, the patient would be at acceptable risk for the planned procedure without further cardiovascular testing.   Pharmacy reviewed protocol and per pharmD [place], "Per office protocol, patient can hold Xarelto for 3 days prior to procedure."  I will route this recommendation to the requesting party via [facility] fax function and remove from pre-op pool.  Please call with questions.  Charlie Pitter, PA-C 12/24/2017, 2:31 PM

## 2017-12-25 LAB — CUP PACEART REMOTE DEVICE CHECK
Battery Impedance: 350 Ohm
Battery Remaining Longevity: 108 mo
Battery Voltage: 2.79 V
Brady Statistic AP VS Percent: 2 %
Brady Statistic AS VP Percent: 69 %
Date Time Interrogation Session: 20190529123417
Implantable Lead Implant Date: 20140421
Implantable Lead Location: 753859
Implantable Lead Model: 5592
Lead Channel Pacing Threshold Amplitude: 0.375 V
Lead Channel Pacing Threshold Amplitude: 0.375 V
Lead Channel Pacing Threshold Pulse Width: 0.4 ms
Lead Channel Pacing Threshold Pulse Width: 0.4 ms
Lead Channel Setting Pacing Amplitude: 1.5 V
Lead Channel Setting Pacing Amplitude: 2 V
MDC IDC LEAD IMPLANT DT: 20140421
MDC IDC LEAD LOCATION: 753860
MDC IDC MSMT LEADCHNL RA IMPEDANCE VALUE: 521 Ohm
MDC IDC MSMT LEADCHNL RV IMPEDANCE VALUE: 671 Ohm
MDC IDC PG IMPLANT DT: 20140421
MDC IDC SET LEADCHNL RV PACING PULSEWIDTH: 0.4 ms
MDC IDC SET LEADCHNL RV SENSING SENSITIVITY: 5.6 mV
MDC IDC STAT BRADY AP VP PERCENT: 10 %
MDC IDC STAT BRADY AS VS PERCENT: 19 %

## 2017-12-31 DIAGNOSIS — M0579 Rheumatoid arthritis with rheumatoid factor of multiple sites without organ or systems involvement: Secondary | ICD-10-CM | POA: Diagnosis not present

## 2018-01-05 ENCOUNTER — Ambulatory Visit (HOSPITAL_COMMUNITY)
Admission: RE | Admit: 2018-01-05 | Discharge: 2018-01-05 | Disposition: A | Discharge status: Home / Self Care | Payer: Medicare Other | Source: Ambulatory Visit | Attending: Neurosurgery | Admitting: Neurosurgery

## 2018-01-05 DIAGNOSIS — M4727 Other spondylosis with radiculopathy, lumbosacral region: Secondary | ICD-10-CM

## 2018-01-05 DIAGNOSIS — Z7901 Long term (current) use of anticoagulants: Secondary | ICD-10-CM | POA: Insufficient documentation

## 2018-01-05 DIAGNOSIS — M48061 Spinal stenosis, lumbar region without neurogenic claudication: Secondary | ICD-10-CM | POA: Diagnosis not present

## 2018-01-05 DIAGNOSIS — M4807 Spinal stenosis, lumbosacral region: Secondary | ICD-10-CM | POA: Insufficient documentation

## 2018-01-05 DIAGNOSIS — Z7984 Long term (current) use of oral hypoglycemic drugs: Secondary | ICD-10-CM | POA: Diagnosis not present

## 2018-01-05 DIAGNOSIS — Z79899 Other long term (current) drug therapy: Secondary | ICD-10-CM | POA: Insufficient documentation

## 2018-01-05 DIAGNOSIS — I700 Atherosclerosis of aorta: Secondary | ICD-10-CM | POA: Diagnosis not present

## 2018-01-05 LAB — GLUCOSE, CAPILLARY: Glucose-Capillary: 154 mg/dL — ABNORMAL HIGH (ref 65–99)

## 2018-01-05 MED ORDER — OXYCODONE HCL 5 MG PO TABS: 5.0000 mg | ORAL_TABLET | ORAL | Status: DC | PRN

## 2018-01-05 MED ORDER — ONDANSETRON HCL 4 MG/2ML IJ SOLN: 4.0000 mg | Freq: Four times a day (QID) | INTRAMUSCULAR | Status: DC | PRN

## 2018-01-05 MED ORDER — DIAZEPAM 5 MG PO TABS
10.0000 mg | ORAL_TABLET | Freq: Once | ORAL | Status: AC
  Administered 2018-01-05: 10 mg via ORAL
  Filled 2018-01-05: qty 2

## 2018-01-05 MED ORDER — LIDOCAINE HCL (PF) 1 % IJ SOLN
5.0000 mL | Freq: Once | INTRAMUSCULAR | Status: AC
  Administered 2018-01-05: 5 mL via INTRADERMAL

## 2018-01-05 MED ORDER — LORAZEPAM 1 MG PO TABS
1.0000 mg | ORAL_TABLET | Freq: Once | ORAL | Status: AC
  Filled 2018-01-05: qty 1

## 2018-01-05 MED ORDER — LORAZEPAM 2 MG/ML IJ SOLN
INTRAMUSCULAR | Status: AC
  Filled 2018-01-05: qty 1

## 2018-01-05 MED ORDER — IOPAMIDOL (ISOVUE-M 200) INJECTION 41%
20.0000 mL | Freq: Once | INTRAMUSCULAR | Status: AC
  Administered 2018-01-05: 20 mL via INTRATHECAL

## 2018-01-05 MED ORDER — DIAZEPAM 5 MG PO TABS
ORAL_TABLET | ORAL | Status: AC
  Administered 2018-01-05: 10 mg via ORAL
  Filled 2018-01-05: qty 2

## 2018-01-05 MED ORDER — LORAZEPAM 2 MG/ML IJ SOLN
1.0000 mg | Freq: Once | INTRAMUSCULAR | Status: AC
Start: 2018-01-05 — End: 2018-01-05
  Administered 2018-01-05: 1 mg via INTRAMUSCULAR

## 2018-01-05 NOTE — Discharge Instructions (Signed)
Myelogram, Care After °These instructions give you information about caring for yourself after your procedure. Your doctor may also give you more specific instructions. Call your doctor if you have any problems or questions after your procedure. °Follow these instructions at home: °· Drink enough fluid to keep your pee (urine) clear or pale yellow. °· Rest as told by your doctor. °· Lie flat with your head slightly raised (elevated). °· Do not bend, lift, or do any hard activities for 24-48 hours or as told by your doctor. °· Take over-the-counter and prescription medicines only as told by your doctor. °· Take care of and remove your bandage (dressing) as told by your doctor. °· Bathe or shower as told by your doctor. °Contact a health care provider if: °· You have a fever. °· You have a headache that lasts longer than 24 hours. °· You feel sick to your stomach (nauseous). °· You throw up (vomit). °· Your neck is stiff. °· Your legs feel numb. °· You cannot pee. °· You cannot poop (have a bowel movement). °· You have a rash. °· You are itchy or sneezing. °Get help right away if: °· You have new symptoms or your symptoms get worse. °· You have a seizure. °· You have trouble breathing. °This information is not intended to replace advice given to you by your health care provider. Make sure you discuss any questions you have with your health care provider. °Document Released: 04/23/2008 Document Revised: 03/14/2016 Document Reviewed: 04/27/2015 °Elsevier Interactive Patient Education © 2018 [employer] ° °

## 2018-01-08 DIAGNOSIS — M4727 Other spondylosis with radiculopathy, lumbosacral region: Secondary | ICD-10-CM | POA: Diagnosis not present

## 2018-01-12 ENCOUNTER — Emergency Department (HOSPITAL_COMMUNITY)
Admission: EM | Admit: 2018-01-12 | Discharge: 2018-01-12 | Disposition: A | Discharge status: Home / Self Care | Payer: Medicare Other | Attending: Emergency Medicine | Admitting: Emergency Medicine

## 2018-01-12 ENCOUNTER — Encounter (HOSPITAL_COMMUNITY): Payer: Self-pay | Admitting: Emergency Medicine

## 2018-01-12 ENCOUNTER — Emergency Department (HOSPITAL_COMMUNITY): Payer: Medicare Other

## 2018-01-12 ENCOUNTER — Other Ambulatory Visit: Payer: Self-pay

## 2018-01-12 DIAGNOSIS — Z79899 Other long term (current) drug therapy: Secondary | ICD-10-CM | POA: Insufficient documentation

## 2018-01-12 DIAGNOSIS — I10 Essential (primary) hypertension: Secondary | ICD-10-CM | POA: Diagnosis not present

## 2018-01-12 DIAGNOSIS — Z7901 Long term (current) use of anticoagulants: Secondary | ICD-10-CM | POA: Insufficient documentation

## 2018-01-12 DIAGNOSIS — I480 Paroxysmal atrial fibrillation: Secondary | ICD-10-CM | POA: Diagnosis not present

## 2018-01-12 DIAGNOSIS — M25561 Pain in right knee: Secondary | ICD-10-CM | POA: Insufficient documentation

## 2018-01-12 DIAGNOSIS — Z950 Presence of cardiac pacemaker: Secondary | ICD-10-CM | POA: Diagnosis not present

## 2018-01-12 DIAGNOSIS — E119 Type 2 diabetes mellitus without complications: Secondary | ICD-10-CM | POA: Insufficient documentation

## 2018-01-12 DIAGNOSIS — F17200 Nicotine dependence, unspecified, uncomplicated: Secondary | ICD-10-CM | POA: Insufficient documentation

## 2018-01-12 DIAGNOSIS — Z7984 Long term (current) use of oral hypoglycemic drugs: Secondary | ICD-10-CM | POA: Insufficient documentation

## 2018-01-12 MED ORDER — HYDROCODONE-ACETAMINOPHEN 5-325 MG PO TABS: ORAL_TABLET | ORAL | 0 refills | Status: DC

## 2018-01-12 MED ORDER — HYDROCODONE-ACETAMINOPHEN 5-325 MG PO TABS
1.0000 | ORAL_TABLET | Freq: Once | ORAL | Status: AC
  Administered 2018-01-12: 1 via ORAL
  Filled 2018-01-12: qty 1

## 2018-01-12 NOTE — Discharge Instructions (Addendum)
Wear the brace as needed for support to your knee.  Call your orthopedic provider to arrange a follow-up appt.

## 2018-01-12 NOTE — ED Triage Notes (Signed)
Pt reports right knee pain since Friday with no injury.  Right leg noted to be swollen, but pt denies pain in any other region other than patella.  States it has hurt like this before, but not this bad or this long.

## 2018-01-14 NOTE — ED Provider Notes (Signed)
[facility] EMERGENCY DEPARTMENT Provider Note   CSN: [id number] Arrival date & time: 01/12/18  1259     History   Chief Complaint Chief Complaint  Patient presents with  . Knee Pain    HPI DREDYN GUBBELS is a 67 y.o. male.  HPI   SHERMAINE BRIGHAM is a 67 y.o. male who presents to the Emergency Department complaining of pain and swelling to the right knee for 3 days.  Describes a throbbing pain to the anterior knee over the patella.  Pain associated with weight bearing.  Denies injury, but states that he has been "shifting his weight" to the the right leg recently due to left low back and leg pain that is followed by neurosurgery. Has some swelling of the knee.  Denies fever, chills, redness, numbness and weakness of the extremity   Past Medical History:  Diagnosis Date  . A-fib ([place])   . Arthritis    RA  . Chest pain   . Chronic back pain   . Dyspnea   . GERD (gastroesophageal reflux disease)   . Lumbar radiculopathy   . Pacemaker    CHB, PAF  . Pre-diabetes   . Smoking     Patient Active Problem List   Diagnosis Date Noted  . Prediabetes 03/05/2016  . Rheumatoid arthritis involving both hands with negative rheumatoid factor ([place]) 03/05/2016  . Sepsis due to pneumonia ([place]) 03/04/2016  . Fracture of lateral malleolus 02/11/2015  . Paroxysmal atrial fibrillation ([place]) 08/01/2014  . Chest pain 06/14/2013  . Tobacco use disorder 06/14/2013  . Essential hypertension 06/10/2013  . Pacemaker 06/10/2013  . DM (diabetes mellitus) ([place]) 06/10/2013    Past Surgical History:  Procedure Laterality Date  . CHOLECYSTECTOMY    . INGUINAL HERNIA REPAIR Bilateral 04/24/2016   Procedure: LAPAROSCOPIC BILATERAL INGUINAL HERNIA REPAIR;  Surgeon: Coralie Keens, MD;  Location: [place];  Service: General;  Laterality: Bilateral;  . INSERTION OF MESH Bilateral 04/24/2016   Procedure: INSERTION OF MESH;  Surgeon: Coralie Keens, MD;  Location: [place];  Service: General;  Laterality: Bilateral;  . SHOULDER ARTHROSCOPY Right    x2  . VASECTOMY          Home Medications    Prior to Admission medications   Medication Sig Start Date End Date Taking? Authorizing Provider  Abatacept (ORENCIA IV) Inject into the vein every 30 (thirty) days.    [provider]  Cinnamon 500 MG capsule Take 1,000 mg by mouth daily.    [provider]  diltiazem (TIAZAC) 360 MG 24 hr capsule TAKE 1 CAPSULE BY MOUTH EVERY DAY 08/18/17   Evans Lance, MD  HYDROcodone-acetaminophen (NORCO/VICODIN) 5-325 MG tablet Take one tab po q 4 hrs prn pain 01/12/18   Dyer Klug, PA-C  loratadine (CLARITIN) 10 MG tablet Take 10 mg by mouth daily.    [provider]  losartan-hydrochlorothiazide (HYZAAR) 100-12.5 MG per tablet Take 1 tablet by mouth daily.    [provider]  metFORMIN (GLUCOPHAGE-XR) 500 MG 24 hr tablet Take 500 mg by mouth 2 (two) times daily with a meal. 11/28/17   [provider]  Multiple Vitamins-Minerals (MEGA MULTIVITAMIN FOR MEN PO) Take 1 tablet by mouth daily.     [provider]  omeprazole (PRILOSEC) 20 MG capsule Take 20 mg by mouth daily.    [provider]  vitamin B-12 (CYANOCOBALAMIN) 1000 MCG tablet Take 1,000 mcg by mouth daily.  [provider]  XARELTO 20 MG TABS tablet TAKE 1 TABLET BY MOUTH DAILY WITH SUPPER. 04/23/17   Evans Lance, MD    Family History History reviewed. No pertinent family history.  Social History Social History   Tobacco Use  . Smoking status: Current Every Day Smoker    Packs/day: 0.50  . Smokeless tobacco: Never Used  Substance Use Topics  . Alcohol use: Yes    Comment: social  . Drug use: No     Allergies   Patient has no known allergies.   Review of Systems Review of Systems  Constitutional: Negative for chills and fever.  Respiratory: Negative for shortness of breath.   Musculoskeletal:  Positive for arthralgias (knee pain) and joint swelling.  Skin: Negative for color change and wound.  Neurological: Negative for weakness and numbness.  All other systems reviewed and are negative.    Physical Exam Updated Vital Signs BP 122/76 (BP Location: Right Arm)   Pulse 78   Temp 98.7 F (37.1 C) (Oral)   Resp 16   Ht 5\' 6"  (1.676 m)   Wt 83.9 kg (185 lb)   SpO2 97%   BMI 29.86 kg/m   Physical Exam  Constitutional: He appears well-developed and well-nourished. No distress.  Cardiovascular: Normal rate, regular rhythm and intact distal pulses.  Pulmonary/Chest: Effort normal and breath sounds normal.  Musculoskeletal: He exhibits tenderness. He exhibits no deformity.  ttp of the anterior right knee over the patella.  Mild edema w/o palpable effusion.  No erythema, excessive warmth.  Neg drawer sign.    Neurological: He is alert. No sensory deficit.  Skin: Skin is warm and dry. Capillary refill takes less than 2 seconds. No erythema.  Psychiatric: He has a normal mood and affect.  Nursing note and vitals reviewed.    ED Treatments / Results  Labs (all labs ordered are listed, but only abnormal results are displayed) Labs Reviewed - No data to display  EKG None  Radiology Dg Knee Complete 4 Views Right  Result Date: 01/12/2018 CLINICAL DATA:  68 year old male with right knee pain since waking up 01/09/2018. No known injury. Initial encounter. EXAM: RIGHT KNEE - COMPLETE 4+ VIEW COMPARISON:  None. FINDINGS: Moderate tricompartment degenerative changes most notable patellofemoral joint space. Small suprapatellar joint effusion. No fracture or dislocation. Vascular calcifications. IMPRESSION: Moderate tricompartment degenerative changes most notable patellofemoral joint space. Small suprapatellar joint effusion. No fracture or dislocation. Electronically Signed   By: Genia Del M.D.   On: 01/12/2018 14:13    Procedures Procedures (including critical care  time)  Medications Ordered in ED Medications  HYDROcodone-acetaminophen (NORCO/VICODIN) 5-325 MG per tablet 1 tablet (1 tablet Oral Given 01/12/18 1512)     Initial Impression / Assessment and Plan / ED Course  I have reviewed the triage vital signs and the nursing notes.  Pertinent labs & imaging results that were available during my care of the patient were reviewed by me and considered in my medical decision making (see chart for details).     Pt with knee pain.  Doubt septic joint.  NV intact.   Knee sleeve applied, pain improved. Likely inflammatory   Final Clinical Impressions(s) / ED Diagnoses   Final diagnoses:  Acute pain of right knee    ED Discharge Orders        Ordered    HYDROcodone-acetaminophen (NORCO/VICODIN) 5-325 MG tablet     01/12/18 1504       Skarlette Lattner, Lynelle Smoke, PA-C 01/14/18 2238  Isla Pence, MD 01/17/18 [id number]

## 2018-02-04 DIAGNOSIS — M0579 Rheumatoid arthritis with rheumatoid factor of multiple sites without organ or systems involvement: Secondary | ICD-10-CM | POA: Diagnosis not present

## 2018-02-10 DIAGNOSIS — M5416 Radiculopathy, lumbar region: Secondary | ICD-10-CM | POA: Diagnosis not present

## 2018-02-23 DIAGNOSIS — M5417 Radiculopathy, lumbosacral region: Secondary | ICD-10-CM | POA: Diagnosis not present

## 2018-02-23 DIAGNOSIS — M4727 Other spondylosis with radiculopathy, lumbosacral region: Secondary | ICD-10-CM | POA: Diagnosis not present

## 2018-02-26 ENCOUNTER — Other Ambulatory Visit: Payer: Self-pay

## 2018-03-09 DIAGNOSIS — E663 Overweight: Secondary | ICD-10-CM | POA: Diagnosis not present

## 2018-03-09 DIAGNOSIS — Z79899 Other long term (current) drug therapy: Secondary | ICD-10-CM | POA: Diagnosis not present

## 2018-03-09 DIAGNOSIS — Z6828 Body mass index (BMI) 28.0-28.9, adult: Secondary | ICD-10-CM | POA: Diagnosis not present

## 2018-03-09 DIAGNOSIS — M5417 Radiculopathy, lumbosacral region: Secondary | ICD-10-CM | POA: Diagnosis not present

## 2018-03-09 DIAGNOSIS — M2550 Pain in unspecified joint: Secondary | ICD-10-CM | POA: Diagnosis not present

## 2018-03-09 DIAGNOSIS — M0579 Rheumatoid arthritis with rheumatoid factor of multiple sites without organ or systems involvement: Secondary | ICD-10-CM | POA: Diagnosis not present

## 2018-03-10 DIAGNOSIS — M4727 Other spondylosis with radiculopathy, lumbosacral region: Secondary | ICD-10-CM | POA: Diagnosis not present

## 2018-03-10 DIAGNOSIS — I10 Essential (primary) hypertension: Secondary | ICD-10-CM | POA: Diagnosis not present

## 2018-03-10 DIAGNOSIS — Z6828 Body mass index (BMI) 28.0-28.9, adult: Secondary | ICD-10-CM | POA: Diagnosis not present

## 2018-03-10 DIAGNOSIS — M5126 Other intervertebral disc displacement, lumbar region: Secondary | ICD-10-CM | POA: Diagnosis not present

## 2018-03-11 ENCOUNTER — Other Ambulatory Visit: Payer: Self-pay | Admitting: Neurosurgery

## 2018-03-11 DIAGNOSIS — M0579 Rheumatoid arthritis with rheumatoid factor of multiple sites without organ or systems involvement: Secondary | ICD-10-CM | POA: Diagnosis not present

## 2018-03-12 ENCOUNTER — Telehealth: Payer: Self-pay

## 2018-03-12 NOTE — Telephone Encounter (Signed)
   [facility] Pre-operative Risk Assessment    Request for surgical clearance:  1. What type of surgery is being performed? Left L2-3 Laminectomy/Foraminotomy    2. When is this surgery scheduled? 04/10/18   3. What type of clearance is required (medical clearance vs. Pharmacy clearance to hold med vs. Both)? Both  4. Are there any medications that need to be held prior to surgery and how long? Xarelto prior to surgery    5. Practice name and name of physician performing surgery? [facility], Dr.Kyle Cabbell   6. What is your office phone number [phone]   7.   What is your office fax number [phone]  8.   Anesthesia type (None, local, MAC, general) ? None Listed    Mendel Ryder 03/12/2018, 10:53 AM  _________________________________________________________________   (provider comments below)

## 2018-03-12 NOTE — Telephone Encounter (Signed)
Patient with diagnosis of Afib on Xarelto for anticoagulation.    Procedure: L2-3 Laminectomy/foraminotomy Date of procedure: 04/10/18  CHADS2-VASc score of  3 (CHF, HTN, AGE, DM2, stroke/tia x 2, CAD, AGE, male)  CrCl 78ml/min  Per office protocol, patient can hold Xarelto for 3 days prior to procedure.

## 2018-03-25 ENCOUNTER — Ambulatory Visit (INDEPENDENT_AMBULATORY_CARE_PROVIDER_SITE_OTHER): Payer: Medicare Other | Admitting: *Deleted

## 2018-03-25 DIAGNOSIS — I442 Atrioventricular block, complete: Secondary | ICD-10-CM | POA: Diagnosis not present

## 2018-03-26 NOTE — Progress Notes (Signed)
Remote pacemaker transmission.   

## 2018-04-02 NOTE — Pre-Procedure Instructions (Signed)
Adam Watts  04/02/2018      [facility] - [place], [place], [place] [address] [place] [address] Phone: [phone] Fax: [phone]    Your procedure is scheduled on Friday September 13th.  Report to [facility] Admitting at 0700 A.M.  Call this number if you have problems the morning of surgery:  [phone]   Remember:  Do not eat or drink after midnight.    Take these medicines the morning of surgery with A SIP OF WATER   Diltiazem  Claritin  Prilosec Follow your surgeons instructions on when to stop taking Xarelto.  7 days prior to surgery STOP taking any Aspirin(unless otherwise instructed by your surgeon), Aleve, Naproxen, Ibuprofen, Motrin, Advil, Goody's, BC's, all herbal medications, fish oil, and all vitamins   WHAT DO I DO ABOUT MY DIABETES MEDICATION?   Marland Kitchen Do not take oral diabetes medicines (pills) the morning of surgery. Metformin   How to Manage Your Diabetes Before and After Surgery  Why is it important to control my blood sugar before and after surgery? . Improving blood sugar levels before and after surgery helps healing and can limit problems. . A way of improving blood sugar control is eating a healthy diet by: o  Eating less sugar and carbohydrates o  Increasing activity/exercise o  Talking with your doctor about reaching your blood sugar goals . High blood sugars (greater than 180 mg/dL) can raise your risk of infections and slow your recovery, so you will need to focus on controlling your diabetes during the weeks before surgery. . Make sure that the doctor who takes care of your diabetes knows about your planned surgery including the date and location.  How do I manage my blood sugar before surgery? . Check your blood sugar at least 4 times a day, starting 2 days before surgery, to make sure that the level is not too high or low. o Check your blood sugar the morning of your surgery when you  wake up and every 2 hours until you get to the Short Stay unit. . If your blood sugar is less than 70 mg/dL, you will need to treat for low blood sugar: o Do not take insulin. o Treat a low blood sugar (less than 70 mg/dL) with  cup of clear juice (cranberry or apple), 4 glucose tablets, OR glucose gel. o Recheck blood sugar in 15 minutes after treatment (to make sure it is greater than 70 mg/dL). If your blood sugar is not greater than 70 mg/dL on recheck, call [phone] for further instructions. . Report your blood sugar to the short stay nurse when you get to Short Stay.  . If you are admitted to the hospital after surgery: o Your blood sugar will be checked by the staff and you will probably be given insulin after surgery (instead of oral diabetes medicines) to make sure you have good blood sugar levels. o The goal for blood sugar control after surgery is 80-180 mg/dL     Do not wear jewelry, make-up or nail polish.  Do not wear lotions, powders, or perfumes, or deodorant.  Do not shave 48 hours prior to surgery.  Men may shave face and neck.  Do not bring valuables to the hospital.  [facility] is not responsible for any belongings or valuables.  Contacts, dentures or bridgework may not be worn into surgery.  Leave your suitcase in the car.  After surgery it may be brought to your room.  For patients admitted to the hospital, discharge time will be determined by your treatment team.  Patients discharged the day of surgery will not be allowed to drive home.    [facility]- Preparing For Surgery  Before surgery, you can play an important role. Because skin is not sterile, your skin needs to be as free of germs as possible. You can reduce the number of germs on your skin by washing with CHG (chlorahexidine gluconate) Soap before surgery.  CHG is an antiseptic cleaner which kills germs and bonds with the skin to continue killing germs even after washing.    Oral Hygiene is also  important to reduce your risk of infection.  Remember - BRUSH YOUR TEETH THE MORNING OF SURGERY WITH YOUR REGULAR TOOTHPASTE  Please do not use if you have an allergy to CHG or antibacterial soaps. If your skin becomes reddened/irritated stop using the CHG.  Do not shave (including legs and underarms) for at least 48 hours prior to first CHG shower. It is OK to shave your face.  Please follow these instructions carefully.   1. Shower the NIGHT BEFORE SURGERY and the MORNING OF SURGERY with CHG.   2. If you chose to wash your hair, wash your hair first as usual with your normal shampoo.  3. After you shampoo, rinse your hair and body thoroughly to remove the shampoo.  4. Use CHG as you would any other liquid soap. You can apply CHG directly to the skin and wash gently with a scrungie or a clean washcloth.   5. Apply the CHG Soap to your body ONLY FROM THE NECK DOWN.  Do not use on open wounds or open sores. Avoid contact with your eyes, ears, mouth and genitals (private parts). Wash Face and genitals (private parts)  with your normal soap.  6. Wash thoroughly, paying special attention to the area where your surgery will be performed.  7. Thoroughly rinse your body with warm water from the neck down.  8. DO NOT shower/wash with your normal soap after using and rinsing off the CHG Soap.  9. Pat yourself dry with a CLEAN TOWEL.  10. Wear CLEAN PAJAMAS to bed the night before surgery, wear comfortable clothes the morning of surgery  11. Place CLEAN SHEETS on your bed the night of your first shower and DO NOT SLEEP WITH PETS.    Day of Surgery:  Do not apply any deodorants/lotions.  Please wear clean clothes to the hospital/surgery center.   Remember to brush your teeth WITH YOUR REGULAR TOOTHPASTE.    Please read over the following fact sheets that you were given.

## 2018-04-03 ENCOUNTER — Encounter (HOSPITAL_COMMUNITY): Payer: Self-pay

## 2018-04-03 ENCOUNTER — Other Ambulatory Visit: Payer: Self-pay

## 2018-04-03 ENCOUNTER — Encounter (HOSPITAL_COMMUNITY)
Admission: RE | Admit: 2018-04-03 | Discharge: 2018-04-03 | Disposition: A | Discharge status: Home / Self Care | Payer: Medicare Other | Source: Ambulatory Visit | Attending: Neurosurgery | Admitting: Neurosurgery

## 2018-04-03 DIAGNOSIS — Z7984 Long term (current) use of oral hypoglycemic drugs: Secondary | ICD-10-CM | POA: Insufficient documentation

## 2018-04-03 DIAGNOSIS — F17210 Nicotine dependence, cigarettes, uncomplicated: Secondary | ICD-10-CM | POA: Insufficient documentation

## 2018-04-03 DIAGNOSIS — Z79899 Other long term (current) drug therapy: Secondary | ICD-10-CM | POA: Insufficient documentation

## 2018-04-03 DIAGNOSIS — Z01818 Encounter for other preprocedural examination: Secondary | ICD-10-CM | POA: Diagnosis not present

## 2018-04-03 DIAGNOSIS — R9431 Abnormal electrocardiogram [ECG] [EKG]: Secondary | ICD-10-CM | POA: Diagnosis not present

## 2018-04-03 DIAGNOSIS — M5126 Other intervertebral disc displacement, lumbar region: Secondary | ICD-10-CM | POA: Insufficient documentation

## 2018-04-03 DIAGNOSIS — M069 Rheumatoid arthritis, unspecified: Secondary | ICD-10-CM | POA: Insufficient documentation

## 2018-04-03 DIAGNOSIS — K219 Gastro-esophageal reflux disease without esophagitis: Secondary | ICD-10-CM | POA: Insufficient documentation

## 2018-04-03 DIAGNOSIS — Z950 Presence of cardiac pacemaker: Secondary | ICD-10-CM | POA: Insufficient documentation

## 2018-04-03 DIAGNOSIS — I4891 Unspecified atrial fibrillation: Secondary | ICD-10-CM | POA: Insufficient documentation

## 2018-04-03 HISTORY — DX: Essential (primary) hypertension: I10

## 2018-04-03 HISTORY — DX: Cardiac arrhythmia, unspecified: I49.9

## 2018-04-03 HISTORY — DX: Pneumonia, unspecified organism: J18.9

## 2018-04-03 LAB — BASIC METABOLIC PANEL
Anion gap: 14 (ref 5–15)
BUN: 9 mg/dL (ref 8–23)
CALCIUM: 9.6 mg/dL (ref 8.9–10.3)
CO2: 22 mmol/L (ref 22–32)
CREATININE: 0.95 mg/dL (ref 0.61–1.24)
Chloride: 101 mmol/L (ref 98–111)
GFR calc non Af Amer: >60 mL/min (ref >60)
Glucose, Bld: 138 mg/dL — ABNORMAL HIGH (ref 70–99)
Potassium: 3.5 mmol/L (ref 3.5–5.1)
Sodium: 137 mmol/L (ref 135–145)

## 2018-04-03 LAB — CBC
HCT: 38.9 % — ABNORMAL LOW (ref 39.0–52.0)
Hemoglobin: 12.3 g/dL — ABNORMAL LOW (ref 13.0–17.0)
MCH: 25.7 pg — ABNORMAL LOW (ref 26.0–34.0)
MCHC: 31.6 g/dL (ref 30.0–36.0)
MCV: 81.4 fL (ref 78.0–100.0)
PLATELETS: 303 10*3/uL (ref 150–400)
RBC: 4.78 MIL/uL (ref 4.22–5.81)
RDW: 15.8 % — AB (ref 11.5–15.5)
WBC: 12.2 10*3/uL — ABNORMAL HIGH (ref 4.0–10.5)

## 2018-04-03 LAB — GLUCOSE, CAPILLARY: Glucose-Capillary: 145 mg/dL — ABNORMAL HIGH (ref 70–99)

## 2018-04-03 LAB — SURGICAL PCR SCREEN
MRSA, PCR: NEGATIVE
STAPHYLOCOCCUS AUREUS: NEGATIVE

## 2018-04-03 LAB — HEMOGLOBIN A1C
HEMOGLOBIN A1C: 7.2 % — AB (ref 4.8–5.6)
Mean Plasma Glucose: 159.94 mg/dL

## 2018-04-03 NOTE — Progress Notes (Signed)
PCP - Sinda Du MD Cardiologist - Crissie Sickles MD  Chest x-ray - 2017 EKG - 04/03/18 Stress Test - 2016 ECHO - 2014   Fasting Blood Sugar -  135-145 Checks Blood Sugar 1 times a day  Blood Thinner Instructions: MD instructed to stop taking on 04/06/18  Anesthesia review: yes. Cardiac Hx and pacemaker.  Patient denies shortness of breath, fever, cough and chest pain at PAT appointment   Patient verbalized understanding of instructions that were given to them at the PAT appointment. Patient was also instructed that they will need to review over the PAT instructions again at home before surgery.

## 2018-04-06 NOTE — Progress Notes (Signed)
Anesthesia Chart Review:  Case:  [id number] Date/Time:  04/10/18 0845   Procedure:  Left Lumbar 2-3 Laminectomy/Foraminotomy (Left ) - Left Lumbar 2-3 Laminectomy/Foraminotomy   Anesthesia type:  General   Pre-op diagnosis:  Disc Displacement, Lumbar   Location:  [facility] OR ROOM 20 / [facility] OR   Surgeon:  Ashok Pall, MD      DISCUSSION: 67 yo male current smoker for above procedure. Pertinent hx includes Afib, GERD, HTN, CHB with PPM, Rheumatoid arthritis, Chest pain.  Patient was recently cleared by cardiology 12/24/17 prior to having myelogram. Per telephone encounter by Melina Copa, PA-C 12/24/2017: "Patient was contacted 12/24/2017 in reference to pre-operative risk assessment for pending surgery as outlined below.  Adam Watts was last seen 03/2017 by Dr. Lovena Le.  Since that day, Adam Watts has done very well. He has h/o PAF, CHB s/p PPM, RA GERD, tobacco, pre-DM. 2D echo 2014 with normal LV function. Nuc 07/2013 felt low risk. Cr 1.04 in 2018. Revised cardiac risk index 0.4% indicating very low risk of adverse outcome with noncardiac procedure. He is able to complete over 7 METS without any angina or dyspnea (back pain is what limits more strenuous activity) - still active in the yard, housework and walking. Therefore, based on ACC/AHA guidelines, the patient would be at acceptable risk for the planned procedure without further cardiovascular testing."  Pharmacy reviewed protocol and per pharmD [place], "Per office protocol, patient can hold Xarelto for 3 days prior to procedure."  Anticipate he can proceed with surgery as planned barring acute status change.  VS: BP 123/69   Pulse 84   Temp 36.8 C (Oral)   Ht 5\' 6"  (1.676 m)   Wt 82.6 kg   SpO2 96%   BMI 29.38 kg/m   PROVIDERS: Sinda Du, MD is PCP  Cristopher Peru, MD is Cardiologist last seen 04/10/2017  LABS: Labs reviewed: Acceptable for surgery. (all labs ordered are listed, but only abnormal results are  displayed)  Labs Reviewed  GLUCOSE, CAPILLARY - Abnormal; Notable for the following components:      Result Value   Glucose-Capillary 145 (*)    All other components within normal limits  BASIC METABOLIC PANEL - Abnormal; Notable for the following components:   Glucose, Bld 138 (*)    All other components within normal limits  CBC - Abnormal; Notable for the following components:   WBC 12.2 (*)    Hemoglobin 12.3 (*)    HCT 38.9 (*)    MCH 25.7 (*)    RDW 15.8 (*)    All other components within normal limits  HEMOGLOBIN A1C - Abnormal; Notable for the following components:   Hgb A1c MFr Bld 7.2 (*)    All other components within normal limits  SURGICAL PCR SCREEN     IMAGES: CHEST  2 VIEW 03/22/2016  COMPARISON:  PA and lateral chest x-ray of June 28, 2013  FINDINGS: The lungs are adequately inflated. The interstitial markings are increased bilaterally. The cardiac silhouette is mildly enlarged and the central pulmonary vascularity is engorged. The permanent pacemaker is in stable position. There is calcification in the wall of the aortic arch. There is no pleural effusion or pneumothorax. The bony thorax exhibits no acute abnormality.  IMPRESSION: Mildly increased interstitial markings likely reflects low-grade CHF superimposed upon COPD-chronic bronchitis. There is no alveolar pneumonia. The cardiac silhouette and pulmonary vascularity are more prominent today.  Aortic atherosclerosis.  EKG: 04/03/2018: Atrial-sensed ventricular-paced rhythm  CV: Stress Cardiolite 08/12/2014:  Patient was stressed according to the [place] for 5:09 minutes, achieving a work level of max METS 7.00.  The resting heart rate of 65 bpm maximum heart rate 150 bpm.  The value represents 96% of the maximal, age-predicted heart rate.  Resting blood pressure of 132/76 mmHg, rest of maximum blood pressure of 216/93 mmHg.  Stress test was stopped due to fatigue, dyspnea. Summary: Resting ECG:  AV paced.  Functional capacity: Normal.  HR response to exercise: Appropriate.  BP response to exercise: Normal resting BP, exaggerated response.  Chest pain: None.  Arrhythmias: None.  ST changes: Depression downsloping.  Overall impression: Equivocal stress test.  Conclusion: Hypertensive response to exercise without chest pain.  Good exercise tolerance.  TTE 06/28/2013: Study Conclusions  - Study data: Technically adequate study. - Left ventricle: The cavity size was normal. Wall thickness was normal. Systolic function was normal. The estimated ejection fraction was in the range of 55% to 60%. Indeterminate diastolic function. There is evidence of increased left atrial pressure (E/e' 15) - Aortic valve: Mildly calcified annulus. Trileaflet; normal thickness leaflets. Valve area: 2.36cm^2(VTI). Valve area: 2.32cm^2 (Vmax). - Mitral valve: Mildly calcified annulus. Normal thickness leaflets . - Right ventricle: The cavity size was normal. Wall thickness was normal. Pacer wire or catheter noted in right ventricle. Systolic function was normal. RV TAPSE is 1.9 cm.   Past Medical History:  Diagnosis Date  . A-fib ([place])   . Arthritis    RA all over  . Chest pain   . Chronic back pain   . Dysrhythmia    a-fib  . GERD (gastroesophageal reflux disease)   . Hypertension   . Lumbar radiculopathy   . Pacemaker    CHB, PAF  . Pneumonia   . Pre-diabetes   . Smoking     Past Surgical History:  Procedure Laterality Date  . CHOLECYSTECTOMY    . DENTAL SURGERY     implants  . INGUINAL HERNIA REPAIR Bilateral 04/24/2016   Procedure: LAPAROSCOPIC BILATERAL INGUINAL HERNIA REPAIR;  Surgeon: Coralie Keens, MD;  Location: [place];  Service: General;  Laterality: Bilateral;  . INSERTION OF MESH Bilateral 04/24/2016   Procedure: INSERTION OF MESH;  Surgeon: Coralie Keens, MD;  Location: [place];  Service: General;  Laterality:  Bilateral;  . SHOULDER ARTHROSCOPY Right    x2  . VASECTOMY      MEDICATIONS: . Abatacept (ORENCIA IV)  . Cinnamon 500 MG capsule  . diltiazem (TIAZAC) 360 MG 24 hr capsule  . HYDROcodone-acetaminophen (NORCO/VICODIN) 5-325 MG tablet  . loratadine (CLARITIN) 10 MG tablet  . losartan-hydrochlorothiazide (HYZAAR) 100-12.5 MG per tablet  . metFORMIN (GLUCOPHAGE-XR) 500 MG 24 hr tablet  . Multiple Vitamins-Minerals (MEGA MULTIVITAMIN FOR MEN PO)  . omeprazole (PRILOSEC) 20 MG capsule  . vitamin B-12 (CYANOCOBALAMIN) 1000 MCG tablet  . XARELTO 20 MG TABS tablet   No current facility-administered medications for this encounter.    . ondansetron (ZOFRAN) 4 mg in sodium chloride 0.9 % 50 mL IVPB    Wynonia Musty [facility] Short Stay Center/Anesthesiology Phone [phone] 04/06/2018 1:54 PM

## 2018-04-09 NOTE — Progress Notes (Signed)
refaxed periop rx for implanted device.

## 2018-04-10 ENCOUNTER — Ambulatory Visit (HOSPITAL_COMMUNITY): Payer: Medicare Other

## 2018-04-10 ENCOUNTER — Ambulatory Visit (HOSPITAL_COMMUNITY): Payer: Medicare Other | Admitting: Physician Assistant

## 2018-04-10 ENCOUNTER — Encounter (HOSPITAL_COMMUNITY)
Admission: RE | Disposition: A | Discharge status: Home / Self Care | Payer: Self-pay | Source: Ambulatory Visit | Attending: Neurosurgery

## 2018-04-10 ENCOUNTER — Ambulatory Visit (HOSPITAL_COMMUNITY)
Admission: RE | Admit: 2018-04-10 | Discharge: 2018-04-10 | Disposition: A | Discharge status: Home / Self Care | Payer: Medicare Other | Source: Ambulatory Visit | Attending: Neurosurgery | Admitting: Neurosurgery

## 2018-04-10 ENCOUNTER — Ambulatory Visit (HOSPITAL_COMMUNITY): Payer: Medicare Other | Admitting: Anesthesiology

## 2018-04-10 ENCOUNTER — Encounter (HOSPITAL_COMMUNITY): Payer: Self-pay

## 2018-04-10 DIAGNOSIS — Z950 Presence of cardiac pacemaker: Secondary | ICD-10-CM | POA: Insufficient documentation

## 2018-04-10 DIAGNOSIS — K219 Gastro-esophageal reflux disease without esophagitis: Secondary | ICD-10-CM | POA: Diagnosis not present

## 2018-04-10 DIAGNOSIS — Z981 Arthrodesis status: Secondary | ICD-10-CM | POA: Diagnosis not present

## 2018-04-10 DIAGNOSIS — Z419 Encounter for procedure for purposes other than remedying health state, unspecified: Secondary | ICD-10-CM

## 2018-04-10 DIAGNOSIS — M5126 Other intervertebral disc displacement, lumbar region: Secondary | ICD-10-CM | POA: Diagnosis not present

## 2018-04-10 DIAGNOSIS — M48061 Spinal stenosis, lumbar region without neurogenic claudication: Secondary | ICD-10-CM | POA: Insufficient documentation

## 2018-04-10 DIAGNOSIS — Z7901 Long term (current) use of anticoagulants: Secondary | ICD-10-CM | POA: Insufficient documentation

## 2018-04-10 DIAGNOSIS — Z79899 Other long term (current) drug therapy: Secondary | ICD-10-CM | POA: Diagnosis not present

## 2018-04-10 DIAGNOSIS — F17210 Nicotine dependence, cigarettes, uncomplicated: Secondary | ICD-10-CM | POA: Diagnosis not present

## 2018-04-10 DIAGNOSIS — Z7984 Long term (current) use of oral hypoglycemic drugs: Secondary | ICD-10-CM | POA: Diagnosis not present

## 2018-04-10 DIAGNOSIS — I10 Essential (primary) hypertension: Secondary | ICD-10-CM | POA: Insufficient documentation

## 2018-04-10 DIAGNOSIS — R7303 Prediabetes: Secondary | ICD-10-CM | POA: Diagnosis not present

## 2018-04-10 DIAGNOSIS — M1990 Unspecified osteoarthritis, unspecified site: Secondary | ICD-10-CM | POA: Diagnosis not present

## 2018-04-10 DIAGNOSIS — I480 Paroxysmal atrial fibrillation: Secondary | ICD-10-CM | POA: Diagnosis not present

## 2018-04-10 HISTORY — PX: LUMBAR LAMINECTOMY/DECOMPRESSION MICRODISCECTOMY: SHX5026

## 2018-04-10 LAB — GLUCOSE, CAPILLARY
GLUCOSE-CAPILLARY: 210 mg/dL — AB (ref 70–99)
GLUCOSE-CAPILLARY: 278 mg/dL — AB (ref 70–99)
Glucose-Capillary: 137 mg/dL — ABNORMAL HIGH (ref 70–99)

## 2018-04-10 SURGERY — LUMBAR LAMINECTOMY/DECOMPRESSION MICRODISCECTOMY 1 LEVEL
Anesthesia: General | Laterality: Left

## 2018-04-10 MED ORDER — PROPOFOL 10 MG/ML IV BOLUS
INTRAVENOUS | Status: DC | PRN
  Administered 2018-04-10: 160 mg via INTRAVENOUS

## 2018-04-10 MED ORDER — ONDANSETRON HCL 4 MG PO TABS: 4.0000 mg | ORAL_TABLET | Freq: Four times a day (QID) | ORAL | Status: DC | PRN

## 2018-04-10 MED ORDER — DOCUSATE SODIUM 100 MG PO CAPS: 100.0000 mg | ORAL_CAPSULE | Freq: Two times a day (BID) | ORAL | Status: DC

## 2018-04-10 MED ORDER — PHENOL 1.4 % MT LIQD: 1.0000 | OROMUCOSAL | Status: DC | PRN

## 2018-04-10 MED ORDER — CINNAMON 500 MG PO CAPS: 1000.0000 mg | ORAL_CAPSULE | Freq: Every day | ORAL | Status: DC

## 2018-04-10 MED ORDER — MIDAZOLAM HCL 2 MG/2ML IJ SOLN
INTRAMUSCULAR | Status: DC | PRN
  Administered 2018-04-10: 2 mg via INTRAVENOUS

## 2018-04-10 MED ORDER — CHLORHEXIDINE GLUCONATE CLOTH 2 % EX PADS: 6.0000 | MEDICATED_PAD | Freq: Once | CUTANEOUS | Status: DC

## 2018-04-10 MED ORDER — ONDANSETRON HCL 4 MG/2ML IJ SOLN
INTRAMUSCULAR | Status: AC
  Filled 2018-04-10: qty 2

## 2018-04-10 MED ORDER — OXYCODONE HCL 5 MG PO TABS
5.0000 mg | ORAL_TABLET | Freq: Once | ORAL | Status: AC | PRN
  Administered 2018-04-10: 5 mg via ORAL

## 2018-04-10 MED ORDER — LORATADINE 10 MG PO TABS: 10.0000 mg | ORAL_TABLET | Freq: Every day | ORAL | Status: DC

## 2018-04-10 MED ORDER — PROPOFOL 10 MG/ML IV BOLUS
INTRAVENOUS | Status: AC
  Filled 2018-04-10: qty 20

## 2018-04-10 MED ORDER — DEXAMETHASONE SODIUM PHOSPHATE 10 MG/ML IJ SOLN
INTRAMUSCULAR | Status: DC | PRN
  Administered 2018-04-10: 10 mg via INTRAVENOUS

## 2018-04-10 MED ORDER — ADULT MULTIVITAMIN W/MINERALS CH: 1.0000 | ORAL_TABLET | Freq: Every day | ORAL | Status: DC

## 2018-04-10 MED ORDER — ROCURONIUM BROMIDE 50 MG/5ML IV SOSY
PREFILLED_SYRINGE | INTRAVENOUS | Status: AC
  Filled 2018-04-10: qty 10

## 2018-04-10 MED ORDER — ONDANSETRON HCL 4 MG/2ML IJ SOLN: 4.0000 mg | Freq: Four times a day (QID) | INTRAMUSCULAR | Status: DC | PRN

## 2018-04-10 MED ORDER — OXYCODONE HCL 5 MG PO TABS
10.0000 mg | ORAL_TABLET | ORAL | Status: DC | PRN
  Administered 2018-04-10: 10 mg via ORAL
  Filled 2018-04-10: qty 2

## 2018-04-10 MED ORDER — 0.9 % SODIUM CHLORIDE (POUR BTL) OPTIME
TOPICAL | Status: DC | PRN
  Administered 2018-04-10: 1000 mL

## 2018-04-10 MED ORDER — OXYCODONE HCL 5 MG PO TABS
ORAL_TABLET | ORAL | Status: AC
  Filled 2018-04-10: qty 1

## 2018-04-10 MED ORDER — LIDOCAINE 2% (20 MG/ML) 5 ML SYRINGE
INTRAMUSCULAR | Status: AC
  Filled 2018-04-10: qty 5

## 2018-04-10 MED ORDER — KETOROLAC TROMETHAMINE 15 MG/ML IJ SOLN
7.5000 mg | Freq: Four times a day (QID) | INTRAMUSCULAR | Status: DC
  Administered 2018-04-10: 7.5 mg via INTRAVENOUS
  Filled 2018-04-10: qty 1

## 2018-04-10 MED ORDER — HEMOSTATIC AGENTS (NO CHARGE) OPTIME
TOPICAL | Status: DC | PRN
  Administered 2018-04-10: 1 via TOPICAL

## 2018-04-10 MED ORDER — SODIUM CHLORIDE 0.9% FLUSH: 3.0000 mL | Freq: Two times a day (BID) | INTRAVENOUS | Status: DC

## 2018-04-10 MED ORDER — PHENYLEPHRINE HCL 10 MG/ML IJ SOLN
INTRAMUSCULAR | Status: AC
  Filled 2018-04-10: qty 1

## 2018-04-10 MED ORDER — THROMBIN 5000 UNITS EX SOLR
CUTANEOUS | Status: DC | PRN
  Administered 2018-04-10 (×2): 5000 [IU] via TOPICAL

## 2018-04-10 MED ORDER — DILTIAZEM HCL ER COATED BEADS 240 MG PO CP24
360.0000 mg | ORAL_CAPSULE | Freq: Every day | ORAL | Status: DC
  Administered 2018-04-10: 360 mg via ORAL
  Filled 2018-04-10: qty 1

## 2018-04-10 MED ORDER — ONDANSETRON HCL 4 MG/2ML IJ SOLN
INTRAMUSCULAR | Status: DC | PRN
  Administered 2018-04-10: 4 mg via INTRAVENOUS

## 2018-04-10 MED ORDER — OXYCODONE HCL 5 MG PO TABS: 5.0000 mg | ORAL_TABLET | ORAL | Status: DC | PRN

## 2018-04-10 MED ORDER — OXYCODONE HCL 5 MG/5ML PO SOLN: 5.0000 mg | Freq: Once | ORAL | Status: AC | PRN

## 2018-04-10 MED ORDER — THROMBIN 5000 UNITS EX SOLR
CUTANEOUS | Status: AC
  Filled 2018-04-10: qty 10000

## 2018-04-10 MED ORDER — LIDOCAINE-EPINEPHRINE 0.5 %-1:200000 IJ SOLN
INTRAMUSCULAR | Status: AC
  Filled 2018-04-10: qty 1

## 2018-04-10 MED ORDER — LIDOCAINE 2% (20 MG/ML) 5 ML SYRINGE
INTRAMUSCULAR | Status: DC | PRN
  Administered 2018-04-10: 100 mg via INTRAVENOUS

## 2018-04-10 MED ORDER — SUGAMMADEX SODIUM 200 MG/2ML IV SOLN
INTRAVENOUS | Status: DC | PRN
  Administered 2018-04-10: 400 mg via INTRAVENOUS

## 2018-04-10 MED ORDER — SODIUM CHLORIDE 0.9 % IV SOLN: 250.0000 mL | INTRAVENOUS | Status: DC

## 2018-04-10 MED ORDER — METFORMIN HCL ER 500 MG PO TB24
500.0000 mg | ORAL_TABLET | Freq: Two times a day (BID) | ORAL | Status: DC
  Administered 2018-04-10: 500 mg via ORAL
  Filled 2018-04-10: qty 1

## 2018-04-10 MED ORDER — DEXAMETHASONE SODIUM PHOSPHATE 10 MG/ML IJ SOLN
INTRAMUSCULAR | Status: AC
  Filled 2018-04-10: qty 1

## 2018-04-10 MED ORDER — GABAPENTIN 300 MG PO CAPS
300.0000 mg | ORAL_CAPSULE | Freq: Three times a day (TID) | ORAL | Status: DC
  Administered 2018-04-10: 300 mg via ORAL
  Filled 2018-04-10: qty 1

## 2018-04-10 MED ORDER — SODIUM CHLORIDE 0.9% FLUSH: 3.0000 mL | INTRAVENOUS | Status: DC | PRN

## 2018-04-10 MED ORDER — ROCURONIUM BROMIDE 10 MG/ML (PF) SYRINGE
PREFILLED_SYRINGE | INTRAVENOUS | Status: DC | PRN
  Administered 2018-04-10 (×2): 50 mg via INTRAVENOUS

## 2018-04-10 MED ORDER — CEFAZOLIN SODIUM-DEXTROSE 2-4 GM/100ML-% IV SOLN
2.0000 g | INTRAVENOUS | Status: AC
  Administered 2018-04-10: 2 g via INTRAVENOUS
  Filled 2018-04-10: qty 100

## 2018-04-10 MED ORDER — HYDROCHLOROTHIAZIDE 12.5 MG PO CAPS
12.5000 mg | ORAL_CAPSULE | Freq: Every day | ORAL | Status: DC
  Administered 2018-04-10: 12.5 mg via ORAL
  Filled 2018-04-10: qty 1

## 2018-04-10 MED ORDER — LACTATED RINGERS IV SOLN
INTRAVENOUS | Status: DC
  Administered 2018-04-10: 08:00:00 via INTRAVENOUS

## 2018-04-10 MED ORDER — LOSARTAN POTASSIUM 50 MG PO TABS
100.0000 mg | ORAL_TABLET | Freq: Every day | ORAL | Status: DC
  Administered 2018-04-10: 100 mg via ORAL
  Filled 2018-04-10: qty 2

## 2018-04-10 MED ORDER — MENTHOL 3 MG MT LOZG: 1.0000 | LOZENGE | OROMUCOSAL | Status: DC | PRN

## 2018-04-10 MED ORDER — TIZANIDINE HCL 4 MG PO TABS: 4.0000 mg | ORAL_TABLET | Freq: Four times a day (QID) | ORAL | 0 refills | Status: DC | PRN

## 2018-04-10 MED ORDER — SUGAMMADEX SODIUM 500 MG/5ML IV SOLN
INTRAVENOUS | Status: AC
  Filled 2018-04-10: qty 5

## 2018-04-10 MED ORDER — LACTATED RINGERS IV SOLN
INTRAVENOUS | Status: DC | PRN
  Administered 2018-04-10 (×2): via INTRAVENOUS

## 2018-04-10 MED ORDER — LIDOCAINE-EPINEPHRINE 0.5 %-1:200000 IJ SOLN
INTRAMUSCULAR | Status: DC | PRN
  Administered 2018-04-10: 10 mL
  Administered 2018-04-10: 20 mL

## 2018-04-10 MED ORDER — MEGA MULTIVITAMIN FOR MEN PO TABS: 1.0000 | ORAL_TABLET | Freq: Every day | ORAL | Status: DC

## 2018-04-10 MED ORDER — ACETAMINOPHEN 325 MG PO TABS: 650.0000 mg | ORAL_TABLET | ORAL | Status: DC | PRN

## 2018-04-10 MED ORDER — OXYCODONE HCL ER 10 MG PO T12A
10.0000 mg | EXTENDED_RELEASE_TABLET | Freq: Two times a day (BID) | ORAL | Status: DC
  Administered 2018-04-10: 10 mg via ORAL
  Filled 2018-04-10: qty 1

## 2018-04-10 MED ORDER — FENTANYL CITRATE (PF) 250 MCG/5ML IJ SOLN
INTRAMUSCULAR | Status: AC
  Filled 2018-04-10: qty 5

## 2018-04-10 MED ORDER — ACETAMINOPHEN 650 MG RE SUPP: 650.0000 mg | RECTAL | Status: DC | PRN

## 2018-04-10 MED ORDER — ONDANSETRON HCL 4 MG/2ML IJ SOLN: 4.0000 mg | Freq: Once | INTRAMUSCULAR | Status: DC | PRN

## 2018-04-10 MED ORDER — POTASSIUM CHLORIDE IN NACL 20-0.9 MEQ/L-% IV SOLN: INTRAVENOUS | Status: DC

## 2018-04-10 MED ORDER — FENTANYL CITRATE (PF) 100 MCG/2ML IJ SOLN
INTRAMUSCULAR | Status: AC
  Filled 2018-04-10: qty 2

## 2018-04-10 MED ORDER — FENTANYL CITRATE (PF) 250 MCG/5ML IJ SOLN
INTRAMUSCULAR | Status: DC | PRN
  Administered 2018-04-10: 100 ug via INTRAVENOUS
  Administered 2018-04-10: 50 ug via INTRAVENOUS

## 2018-04-10 MED ORDER — MIDAZOLAM HCL 2 MG/2ML IJ SOLN
INTRAMUSCULAR | Status: AC
  Filled 2018-04-10: qty 2

## 2018-04-10 MED ORDER — OXYCODONE HCL 5 MG PO TABS: 5.0000 mg | ORAL_TABLET | Freq: Four times a day (QID) | ORAL | 0 refills | Status: DC | PRN

## 2018-04-10 MED ORDER — DIAZEPAM 5 MG PO TABS
5.0000 mg | ORAL_TABLET | Freq: Four times a day (QID) | ORAL | Status: DC | PRN
  Administered 2018-04-10: 5 mg via ORAL
  Filled 2018-04-10: qty 1

## 2018-04-10 MED ORDER — VITAMIN B-12 1000 MCG PO TABS
1000.0000 ug | ORAL_TABLET | Freq: Every day | ORAL | Status: DC
  Filled 2018-04-10: qty 1

## 2018-04-10 MED ORDER — LOSARTAN POTASSIUM-HCTZ 100-12.5 MG PO TABS: 1.0000 | ORAL_TABLET | Freq: Every day | ORAL | Status: DC

## 2018-04-10 MED ORDER — FENTANYL CITRATE (PF) 100 MCG/2ML IJ SOLN
25.0000 ug | INTRAMUSCULAR | Status: DC | PRN
  Administered 2018-04-10: 50 ug via INTRAVENOUS

## 2018-04-10 MED ORDER — PANTOPRAZOLE SODIUM 40 MG PO TBEC: 40.0000 mg | DELAYED_RELEASE_TABLET | Freq: Every day | ORAL | Status: DC

## 2018-04-10 MED ORDER — ZOLPIDEM TARTRATE 5 MG PO TABS: 5.0000 mg | ORAL_TABLET | Freq: Every evening | ORAL | Status: DC | PRN

## 2018-04-10 SURGICAL SUPPLY — 52 items
BENZOIN TINCTURE PRP APPL 2/3 (GAUZE/BANDAGES/DRESSINGS) IMPLANT
BLADE CLIPPER SURG (BLADE) IMPLANT
BUR MATCHSTICK NEURO 3.0 LAGG (BURR) ×2 IMPLANT
BUR PRECISION FLUTE 5.0 (BURR) IMPLANT
CANISTER SUCT 3000ML PPV (MISCELLANEOUS) ×2 IMPLANT
CARTRIDGE OIL MAESTRO DRILL (MISCELLANEOUS) ×1 IMPLANT
DECANTER SPIKE VIAL GLASS SM (MISCELLANEOUS) ×2 IMPLANT
DERMABOND ADVANCED (GAUZE/BANDAGES/DRESSINGS) ×1
DERMABOND ADVANCED .7 DNX12 (GAUZE/BANDAGES/DRESSINGS) ×1 IMPLANT
DIFFUSER DRILL AIR PNEUMATIC (MISCELLANEOUS) ×2 IMPLANT
DRAPE LAPAROTOMY 100X72X124 (DRAPES) ×2 IMPLANT
DRAPE MICROSCOPE LEICA (MISCELLANEOUS) ×2 IMPLANT
DRAPE SURG 17X23 STRL (DRAPES) ×2 IMPLANT
DURAPREP 26ML APPLICATOR (WOUND CARE) ×2 IMPLANT
ELECT REM PT RETURN 9FT ADLT (ELECTROSURGICAL) ×2
ELECTRODE REM PT RTRN 9FT ADLT (ELECTROSURGICAL) ×1 IMPLANT
GAUZE 4X4 16PLY RFD (DISPOSABLE) IMPLANT
GAUZE SPONGE 4X4 12PLY STRL (GAUZE/BANDAGES/DRESSINGS) IMPLANT
GLOVE BIO SURGEON STRL SZ7.5 (GLOVE) ×2 IMPLANT
GLOVE BIOGEL PI IND STRL 7.5 (GLOVE) ×1 IMPLANT
GLOVE BIOGEL PI IND STRL 8 (GLOVE) ×1 IMPLANT
GLOVE BIOGEL PI INDICATOR 7.5 (GLOVE) ×1
GLOVE BIOGEL PI INDICATOR 8 (GLOVE) ×1
GLOVE ECLIPSE 6.5 STRL STRAW (GLOVE) ×2 IMPLANT
GLOVE ECLIPSE 7.5 STRL STRAW (GLOVE) ×6 IMPLANT
GLOVE EXAM NITRILE LRG STRL (GLOVE) IMPLANT
GLOVE EXAM NITRILE XL STR (GLOVE) IMPLANT
GLOVE EXAM NITRILE XS STR PU (GLOVE) IMPLANT
GOWN STRL REUS W/ TWL LRG LVL3 (GOWN DISPOSABLE) ×2 IMPLANT
GOWN STRL REUS W/ TWL XL LVL3 (GOWN DISPOSABLE) IMPLANT
GOWN STRL REUS W/TWL 2XL LVL3 (GOWN DISPOSABLE) ×2 IMPLANT
GOWN STRL REUS W/TWL LRG LVL3 (GOWN DISPOSABLE) ×2
GOWN STRL REUS W/TWL XL LVL3 (GOWN DISPOSABLE)
KIT BASIN OR (CUSTOM PROCEDURE TRAY) ×2 IMPLANT
KIT TURNOVER KIT B (KITS) ×2 IMPLANT
NEEDLE HYPO 25X1 1.5 SAFETY (NEEDLE) ×2 IMPLANT
NEEDLE SPNL 18GX3.5 QUINCKE PK (NEEDLE) IMPLANT
NS IRRIG 1000ML POUR BTL (IV SOLUTION) ×2 IMPLANT
OIL CARTRIDGE MAESTRO DRILL (MISCELLANEOUS) ×2
PACK LAMINECTOMY NEURO (CUSTOM PROCEDURE TRAY) ×2 IMPLANT
PAD ARMBOARD 7.5X6 YLW CONV (MISCELLANEOUS) ×6 IMPLANT
RUBBERBAND STERILE (MISCELLANEOUS) ×4 IMPLANT
SPONGE LAP 4X18 RFD (DISPOSABLE) IMPLANT
SPONGE SURGIFOAM ABS GEL SZ50 (HEMOSTASIS) ×2 IMPLANT
STRIP CLOSURE SKIN 1/2X4 (GAUZE/BANDAGES/DRESSINGS) IMPLANT
SUT VIC AB 0 CT1 18XCR BRD8 (SUTURE) ×1 IMPLANT
SUT VIC AB 0 CT1 8-18 (SUTURE) ×1
SUT VIC AB 2-0 CT1 18 (SUTURE) ×2 IMPLANT
SUT VIC AB 3-0 SH 8-18 (SUTURE) ×2 IMPLANT
TOWEL GREEN STERILE (TOWEL DISPOSABLE) ×2 IMPLANT
TOWEL GREEN STERILE FF (TOWEL DISPOSABLE) ×2 IMPLANT
WATER STERILE IRR 1000ML POUR (IV SOLUTION) ×2 IMPLANT

## 2018-04-10 NOTE — Anesthesia Procedure Notes (Signed)
Procedure Name: Intubation Date/Time: 04/10/2018 10:07 AM Performed by: Teressa Lower., CRNA Pre-anesthesia Checklist: Patient identified, Emergency Drugs available, Suction available and Patient being monitored Patient Re-evaluated:Patient Re-evaluated prior to induction Oxygen Delivery Method: Circle system utilized Preoxygenation: Pre-oxygenation with 100% oxygen Induction Type: IV induction Ventilation: Mask ventilation without difficulty Laryngoscope Size: Miller and 2 Grade View: Grade I Tube type: Oral Tube size: 7.5 mm Number of attempts: 1 Airway Equipment and Method: Stylet and Oral airway Placement Confirmation: ETT inserted through vocal cords under direct vision,  positive ETCO2 and breath sounds checked- equal and bilateral Secured at: 21 cm Tube secured with: Tape Dental Injury: Teeth and Oropharynx as per pre-operative assessment

## 2018-04-10 NOTE — Op Note (Signed)
04/10/2018  11:18 AM  PATIENT:  Adam Watts  67 y.o. 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 was taken to the operating room, intubated and placed under a general anesthetic without difficulty. He was positioned prone on a Wilson frame with all pressure points padded. His back was prepped and draped in a sterile manner. I opened the skin with a 10 blade and carried the dissection down to the thoracolumbar fascia. I used both sharp dissection and the monopolar cautery to expose the lamina of L2, and 3. I confirmed my location with an intraoperative xray.  I used the drill, Kerrison punches, and curettes to perform a semihemilaminectomy of L2. I used the punches to remove the ligamentum flavum to expose the thecal sac.With Dr.Ostergard's assistance we started our decompression of the spinal canal, thecal sac and L2,3 root(s). The thecal sac was decompressed with Kerrison punches, and the drill. I appreciated the disc space, and did not feel a discetomy was needed. I explored rostrally, laterally, medially, and caudally and was satisfied with the decompression. I irrigated the wound, then closed in layers. I approximated the thoracolumbar fascia, subcutaneous, and subcuticular planes with vicryl sutures. I used dermabond for a sterile dressing.   PLAN OF CARE: Admit for overnight observation  PATIENT DISPOSITION:  PACU - hemodynamically stable.   Delay start of Pharmacological VTE agent (>24hrs) due to surgical  blood loss or risk of bleeding:  yes

## 2018-04-10 NOTE — Progress Notes (Signed)
PHARMACIST - PHYSICIAN ORDER COMMUNICATION  CONCERNING: P&T Medication Policy on Herbal Medications  DESCRIPTION:  This patient's order for:  Cinnamon  has been noted.  This product(s) is classified as an "herbal" or natural product. Due to a lack of definitive safety studies or FDA approval, nonstandard manufacturing practices, plus the potential risk of unknown drug-drug interactions while on inpatient medications, the Pharmacy and Therapeutics Committee does not permit the use of "herbal" or natural products of this type within [facility].   ACTION TAKEN: The pharmacy department is unable to verify this order at this time and your patient has been informed of this safety policy. Please reevaluate patient's clinical condition at discharge and address if the herbal or natural product(s) should be resumed at that time.  Olevia Westervelt A. Anjeli Casad, PharmD, BCPS Clinical Pharmacist [facility] Pager: [phone] Please utilize Amion for appropriate phone number to reach the unit pharmacist ([facility])   

## 2018-04-10 NOTE — Anesthesia Preprocedure Evaluation (Addendum)
Anesthesia Evaluation  Patient identified by MRN, date of birth, ID band Patient awake    Reviewed: Allergy & Precautions, NPO status , Patient's Chart, lab work & pertinent test results  History of Anesthesia Complications Negative for: history of anesthetic complications  Airway Mallampati: III  TM Distance: >3 FB Neck ROM: Full  Mouth opening: Limited Mouth Opening  Dental  (+) Dental Advisory Given, Teeth Intact, Implants   Pulmonary Current Smoker,    breath sounds clear to auscultation       Cardiovascular hypertension, Pt. on medications (-) angina+ dysrhythmias Atrial Fibrillation + pacemaker  Rhythm:Irregular Rate:Normal   EKG - A-sensed, V-paced    Neuro/Psych  Neuromuscular disease (Lumbar radiculopathy) negative psych ROS   GI/Hepatic Neg liver ROS, GERD  Medicated and Controlled,  Endo/Other  diabetes, Type 2, Oral Hypoglycemic Agents  Renal/GU negative Renal ROS  negative genitourinary   Musculoskeletal  (+) Arthritis ,  Chronic back pain    Abdominal   Peds  Hematology negative hematology ROS (+)   Anesthesia Other Findings   Reproductive/Obstetrics                            Anesthesia Physical Anesthesia Plan  ASA: III  Anesthesia Plan: General   Post-op Pain Management:    Induction: Intravenous  PONV Risk Score and Plan: 3 and Treatment may vary due to age or medical condition, Ondansetron, Midazolam and Dexamethasone  Airway Management Planned: Oral ETT  Additional Equipment: None  Intra-op Plan:   Post-operative Plan: Extubation in OR  Informed Consent: I have reviewed the patients History and Physical, chart, labs and discussed the procedure including the risks, benefits and alternatives for the proposed anesthesia with the patient or authorized representative who has indicated his/her understanding and acceptance.   Dental advisory given  Plan  Discussed with: CRNA and Anesthesiologist  Anesthesia Plan Comments:        Anesthesia Quick Evaluation

## 2018-04-10 NOTE — Progress Notes (Signed)
Patient alert and oriented, mae's well, voiding adequate amount of urine, swallowing without difficulty, no c/o pain at time of discharge. Patient discharged home with family. Script and discharged instructions given to patient. Patient and family stated understanding of instructions given. Patient has an appointment with Dr. Cabbell   

## 2018-04-10 NOTE — Transfer of Care (Signed)
Immediate Anesthesia Transfer of Care Note  Patient: Adam Watts  Procedure(s) Performed: Left Lumbar two-three Laminectomy/Foraminotomy (Left )  Patient Location: PACU  Anesthesia Type:General  Level of Consciousness: awake, alert  and oriented  Airway & Oxygen Therapy: Patient Spontanous Breathing and Patient connected to nasal cannula oxygen  Post-op Assessment: Report given to RN and Post -op Vital signs reviewed and stable  Post vital signs: Reviewed and stable  Last Vitals:  Vitals Value Taken Time  BP 155/79 04/10/2018 11:05 AM  Temp 36.4 C 04/10/2018 11:05 AM  Pulse 101 04/10/2018 11:06 AM  Resp 15 04/10/2018 11:06 AM  SpO2 98 % 04/10/2018 11:06 AM  Vitals shown include unvalidated device data.  Last Pain:  Vitals:   04/10/18 0740  TempSrc:   PainSc: 5       Patients Stated Pain Goal: 3 (39/58/44 1712)  Complications: No apparent anesthesia complications

## 2018-04-10 NOTE — Progress Notes (Signed)
Tomi Bamberger; rep for [employer] contacted at [phone] to come see patient prior to surgery for orders regarding pacemaker.

## 2018-04-10 NOTE — Anesthesia Postprocedure Evaluation (Signed)
Anesthesia Post Note  Patient: Adam Watts  Procedure(s) Performed: Left Lumbar two-three Laminectomy/Foraminotomy (Left )     Patient location during evaluation: PACU Anesthesia Type: General Level of consciousness: awake and alert Pain management: pain level controlled Vital Signs Assessment: post-procedure vital signs reviewed and stable Respiratory status: spontaneous breathing, nonlabored ventilation and respiratory function stable Cardiovascular status: blood pressure returned to baseline and stable Postop Assessment: no apparent nausea or vomiting Anesthetic complications: no    Last Vitals:  Vitals:   04/10/18 1205 04/10/18 1220  BP: 122/71 137/74  Pulse: 80 78  Resp: 20 16  Temp:    SpO2: 97% 98%    Last Pain:  Vitals:   04/10/18 1210  TempSrc:   PainSc: 2                  Audry Pili

## 2018-04-10 NOTE — H&P (Signed)
BP 117/67   Pulse 67   Temp 98.2 F (36.8 C) (Oral)   Resp 18   Ht 5\' 6"  (1.676 m)   Wt 82.6 kg   SpO2 97%   BMI 29.37 kg/m    Adam Watts returns today for evaluation of pain that he is having in his back and some weakness in the left lower extremity.  He underwent a lumbar decompression by Dr. Cyndy Freeze at L4-5, L5-S1 without a discectomy for lumbar stenosis.  He did well until March really, and then started having pain again in the back left lower extremity.  Wound is well healed.  No signs of infection.    Adam Watts returns today after myelogram, post myelogram CT of the lumbar spine.  At L3-4, he has a lateral disc bulge on the left side.  At the 2-3 he also has some fullness on the left side.  He does not have any overt disc herniations.  Where he underwent his operation looks quite good at 4-5 and at 5-1.  He weighs 181 pounds.  Pain is 4/10.  Blood pressure is 136/75, pulse is 87, temperature 97.9.  I would recommend that Adam Watts try some injections to the L3 root.   No Known Allergies Past Medical History:  Diagnosis Date  . A-fib ([place])   . Arthritis    RA all over  . Chest pain   . Chronic back pain   . Dysrhythmia    a-fib  . GERD (gastroesophageal reflux disease)   . Hypertension   . Lumbar radiculopathy   . Pacemaker    CHB, PAF  . Pneumonia   . Pre-diabetes   . Smoking    Past Surgical History:  Procedure Laterality Date  . CHOLECYSTECTOMY    . DENTAL SURGERY     implants  . INGUINAL HERNIA REPAIR Bilateral 04/24/2016   Procedure: LAPAROSCOPIC BILATERAL INGUINAL HERNIA REPAIR;  Surgeon: Coralie Keens, MD;  Location: [facility];  Service: General;  Laterality: Bilateral;  . INSERTION OF MESH Bilateral 04/24/2016   Procedure: INSERTION OF MESH;  Surgeon: Coralie Keens, MD;  Location: [facility];  Service: General;  Laterality: Bilateral;  . SHOULDER ARTHROSCOPY Right    x2  . VASECTOMY     History reviewed. No  pertinent family history. Social History   Socioeconomic History  . Marital status: Married    Spouse name: Not on file  . Number of children: Not on file  . Years of education: Not on file  . Highest education level: Not on file  Occupational History  . Not on file  Social Needs  . Financial resource strain: Not on file  . Food insecurity:    Worry: Not on file    Inability: Not on file  . Transportation needs:    Medical: Not on file    Non-medical: Not on file  Tobacco Use  . Smoking status: Current Every Day Smoker    Packs/day: 0.50  . Smokeless tobacco: Never Used  Substance and Sexual Activity  . Alcohol use: Yes    Comment: social  . Drug use: No  . Sexual activity: Not on file  Lifestyle  . Physical activity:    Days per week: Not on file    Minutes per session: Not on file  . Stress: Not on file  Relationships  . Social connections:    Talks on phone: Not on file    Gets together: Not on file  Attends religious service: Not on file    Active member of club or organization: Not on file    Attends meetings of clubs or organizations: Not on file    Relationship status: Not on file  . Intimate partner violence:    Fear of current or ex partner: Not on file    Emotionally abused: Not on file    Physically abused: Not on file    Forced sexual activity: Not on file  Other Topics Concern  . Not on file  Social History Narrative  . Not on file   Prior to Admission medications   Medication Sig Start Date End Date Taking? Authorizing Provider  Abatacept (ORENCIA IV) Inject into the vein every 30 (thirty) days.   Yes [provider]  Cinnamon 500 MG capsule Take 1,000 mg by mouth daily.   Yes [provider]  diltiazem (TIAZAC) 360 MG 24 hr capsule TAKE 1 CAPSULE BY MOUTH EVERY DAY 08/18/17  Yes Evans Lance, MD  loratadine (CLARITIN) 10 MG tablet Take 10 mg by mouth daily.   Yes [provider]  losartan-hydrochlorothiazide  (HYZAAR) 100-12.5 MG per tablet Take 1 tablet by mouth daily.   Yes [provider]  metFORMIN (GLUCOPHAGE-XR) 500 MG 24 hr tablet Take 500 mg by mouth 2 (two) times daily with a meal. 11/28/17  Yes [provider]  Multiple Vitamins-Minerals (MEGA MULTIVITAMIN FOR MEN PO) Take 1 tablet by mouth daily.    Yes [provider]  omeprazole (PRILOSEC) 20 MG capsule Take 20 mg by mouth daily.   Yes [provider]  vitamin B-12 (CYANOCOBALAMIN) 1000 MCG tablet Take 1,000 mcg by mouth daily.   Yes [provider]  XARELTO 20 MG TABS tablet TAKE 1 TABLET BY MOUTH DAILY WITH SUPPER. Patient taking differently: Take 20 mg by mouth daily with supper.  04/23/17  Yes Evans Lance, MD  HYDROcodone-acetaminophen (NORCO/VICODIN) 5-325 MG tablet Take one tab po q 4 hrs prn pain Patient not taking: Reported on 03/31/2018 01/12/18   Kem Parkinson, PA-C  Adam Watts returns today after undergoing 2 nerve root blocks.  The first one was done on the left side at L3-4.  No steroid was used.  When he had a less than good response, Dr. Maryjean Ka then injected him at L2-3.  He had a very good response there before the effect of the local anesthetic wore off.  Given that, and given the fact that he said it lasted for 8 hours but he is in misery again, I have offered him a decompression on the left side at L2-3, possible discectomy, but certainly foraminotomy.  I am doing this in order to relieve the pressure on the left L2 root and the left L3 root.  Risks and benefits, bleeding, infection, need for further surgery, no relief for pain, along with other risks were discussed.  He has had a lumbar procedure done by Dr. Cyndy Freeze, so he is well-versed in this, and will proceed.   He is 5 feet 6 inches, weighs 179 pounds.  Temperature is 98.1, blood pressure is 138/72, pulse is 93.  Pain is 4/10.  He is very weak in the hip flexors 4/5, and in the quadriceps 4/5 on the left side, normal  strength on the right.  He is alert and oriented by 4.  He answers all questions appropriately.  He is in obvious distress.  Has an antalgic gait and moves quite slowly.

## 2018-04-10 NOTE — Discharge Summary (Signed)
Physician Discharge Summary  Patient ID: Adam Watts MRN: [id number] DOB/AGE: 68-10-1950 68 y.o.  Admit date: 04/10/2018 Discharge date: 04/10/2018  Admission Diagnoses:Lumbar lateral recess stenosis, Left L2/3 Discharge Diagnoses:  Active Problems:   Lumbar foraminal stenosis   Discharged Condition: good  Hospital Course: Adam Watts was admitted and taken to the operating room for operative decompression of the L2/3 nerve roots on the left. Post op his wound is clean and dry, no signs of infection. He is ambulating, voiding, and tolerating a regular diet.  He will be discharged home.  Treatments: surgery: Left 2/3 semihemilaminectomy and foraminotomy  Discharge Exam: Blood pressure 127/79, pulse 72, temperature (!) 97.5 F (36.4 C), resp. rate 16, height 5\' 6"  (1.676 m), weight 82.6 kg, SpO2 95 %. General appearance: alert, cooperative, appears stated age and no distress Neurologic: Alert and oriented X 3, normal strength and tone. Normal symmetric reflexes. Normal coordination and gait  Disposition: Discharge disposition: 01-Home or Self Care      Disc Displacement, Lumbar  Allergies as of 04/10/2018   No Known Allergies     Medication List    TAKE these medications   Cinnamon 500 MG capsule Take 1,000 mg by mouth daily.   diltiazem 360 MG 24 hr capsule Commonly known as:  TIAZAC TAKE 1 CAPSULE BY MOUTH EVERY DAY   HYDROcodone-acetaminophen 5-325 MG tablet Commonly known as:  NORCO/VICODIN Take one tab po q 4 hrs prn pain   loratadine 10 MG tablet Commonly known as:  CLARITIN Take 10 mg by mouth daily.   losartan-hydrochlorothiazide 100-12.5 MG tablet Commonly known as:  HYZAAR Take 1 tablet by mouth daily.   MEGA MULTIVITAMIN FOR MEN PO Take 1 tablet by mouth daily.   metFORMIN 500 MG 24 hr tablet Commonly known as:  GLUCOPHAGE-XR Take 500 mg by mouth 2 (two) times daily with a meal.   omeprazole 20 MG capsule Commonly known as:   PRILOSEC Take 20 mg by mouth daily.   ORENCIA IV Inject into the vein every 30 (thirty) days.   oxyCODONE 5 MG immediate release tablet Commonly known as:  Oxy IR/ROXICODONE Take 1 tablet (5 mg total) by mouth every 6 (six) hours as needed for severe pain.   tiZANidine 4 MG tablet Commonly known as:  ZANAFLEX Take 1 tablet (4 mg total) by mouth every 6 (six) hours as needed for muscle spasms.   vitamin B-12 1000 MCG tablet Commonly known as:  CYANOCOBALAMIN Take 1,000 mcg by mouth daily.   XARELTO 20 MG Tabs tablet Generic drug:  rivaroxaban TAKE 1 TABLET BY MOUTH DAILY WITH SUPPER. What changed:  See the new instructions.      Follow-up Information    Ashok Pall, MD Follow up in 3 week(s).   Specialty:  Neurosurgery Why:  Please call the office to make an appointment Contact information: [address] [place] [address] [phone]           Signed: Winfield Cunas 04/10/2018, 5:41 PM

## 2018-04-10 NOTE — Evaluation (Signed)
Physical Therapy Evaluation and Discharge Patient Details Name: Adam Watts MRN: [id number] DOB: Dec 21, 1950 Today's Date: 04/10/2018   History of Present Illness  Pt is a 67 y/o male s/p L2-3 laminectomy. PMH includes a fib, RA, pre-diabetes, HTN, and s/p pacemaker.   Clinical Impression  Patient evaluated by Physical Therapy with no further acute PT needs identified. All education has been completed and the patient has no further questions. Pt overall supervision for gait and stair navigation. No LOB throughout. Educated about precautions and generalized walking program. Pt reports wife will be able to assist at home. See below for any follow-up Physical Therapy or equipment needs. PT is signing off. Thank you for this referral. If needs change, please re-consult.      Follow Up Recommendations No PT follow up    Equipment Recommendations  None recommended by PT    Recommendations for Other Services       Precautions / Restrictions Precautions Precautions: Back Precaution Booklet Issued: Yes (comment) Precaution Comments: Reviewed back precautions with pt  Restrictions Weight Bearing Restrictions: No      Mobility  Bed Mobility               General bed mobility comments: In chair upon entry. Verbally reviewed log roll technique.   Transfers Overall transfer level: Modified independent               General transfer comment: Increased time required, however, no physical assist required.   Ambulation/Gait Ambulation/Gait assistance: Supervision Gait Distance (Feet): 300 Feet Assistive device: None Gait Pattern/deviations: Step-through pattern;Decreased stride length Gait velocity: Decreased    General Gait Details: Slow, cautious gait, however, overall steady. No LOB noted. Educated about generalized walking program to perform at home.   Stairs Stairs: Yes Stairs assistance: Supervision Stair Management: One rail Right;Alternating  pattern;Forwards Number of Stairs: 4 General stair comments: Overall steady with stair navigation using rail. Supervision for safety.   Wheelchair Mobility    Modified Rankin (Stroke Patients Only)       Balance Overall balance assessment: Needs assistance Sitting-balance support: No upper extremity supported;Feet supported Sitting balance-Leahy Scale: Good     Standing balance support: No upper extremity supported;During functional activity Standing balance-Leahy Scale: Good                               Pertinent Vitals/Pain Pain Assessment: Faces Faces Pain Scale: Hurts little more Pain Location: back  Pain Descriptors / Indicators: Aching;Operative site guarding Pain Intervention(s): Limited activity within patient's tolerance;Monitored during session;Repositioned    Home Living Family/patient expects to be discharged to:: Private residence Living Arrangements: Spouse/significant other Available Help at Discharge: Family;Available 24 hours/day Type of Home: House Home Access: Stairs to enter Entrance Stairs-Rails: Right;Left;Can reach both Entrance Stairs-Number of Steps: 4 Home Layout: One level Home Equipment: Walker - 2 wheels;Cane - single point;Shower seat - built in      Prior Function Level of Independence: Independent with assistive device(s)         Comments: Was using cane for ambulation and required multiple seated rests secondary to pain.      Hand Dominance        Extremity/Trunk Assessment   Upper Extremity Assessment Upper Extremity Assessment: Overall WFL for tasks assessed    Lower Extremity Assessment Lower Extremity Assessment: LLE deficits/detail LLE Deficits / Details: Reports LLE much improved following surgery.     Cervical / Trunk Assessment Cervical /  Trunk Assessment: Other exceptions Cervical / Trunk Exceptions: s/p lumbar surgery   Communication   Communication: No difficulties  Cognition  Arousal/Alertness: Awake/alert Behavior During Therapy: WFL for tasks assessed/performed Overall Cognitive Status: Within Functional Limits for tasks assessed                                        General Comments General comments (skin integrity, edema, etc.): Pt's wife present during session.     Exercises     Assessment/Plan    PT Assessment Patent does not need any further PT services  PT Problem List         PT Treatment Interventions      PT Goals (Current goals can be found in the Care Plan section)  Acute Rehab PT Goals Patient Stated Goal: to go home tonight  PT Goal Formulation: With patient Time For Goal Achievement: 04/10/18 Potential to Achieve Goals: Good    Frequency     Barriers to discharge        Co-evaluation               AM-PAC PT "6 Clicks" Daily Activity  Outcome Measure Difficulty turning over in bed (including adjusting bedclothes, sheets and blankets)?: None Difficulty moving from lying on back to sitting on the side of the bed? : A Little Difficulty sitting down on and standing up from a chair with arms (e.g., wheelchair, bedside commode, etc,.)?: A Little Help needed moving to and from a bed to chair (including a wheelchair)?: None Help needed walking in hospital room?: None Help needed climbing 3-5 steps with a railing? : None 6 Click Score: 22    End of Session Equipment Utilized During Treatment: Gait belt Activity Tolerance: Patient tolerated treatment well Patient left: in chair;with call bell/phone within reach;with family/visitor present Nurse Communication: Mobility status PT Visit Diagnosis: Other abnormalities of gait and mobility (R26.89);Pain Pain - part of body: (back )    Time: 1439-1450 PT Time Calculation (min) (ACUTE ONLY): 11 min   Charges:   PT Evaluation $PT Eval Low Complexity: [place], PT, DPT  Acute Rehabilitation Services  Pager: [phone] Office: [phone]   Rudean Hitt 04/10/2018, 3:44 PM

## 2018-04-10 NOTE — Discharge Instructions (Signed)
Lumbar Discectomy °Care After °A discectomy involves removal of discmaterial (the cartilage-like structures located between the bones of the back). It is done to relieve pressure on nerve roots. It can be used as a treatment for a back problem. The time in surgery depends on the findings in surgery and what is necessary to correct the problems. °HOME CARE INSTRUCTIONS  °· Check the cut (incision) made by the surgeon twice a day for signs of infection. Some signs of infection may include:  °· A foul smelling, greenish or yellowish discharge from the wound.  °· Increased pain.  °· Increased redness over the incision (operative) site.  °· The skin edges may separate.  °· Flu-like symptoms (problems).  °· A temperature above 101.5° F (38.6° C).  °· Change your bandages in about 24 to 36 hours following surgery or as directed.  °· You may shower tomrrow.  Avoid bathtubs, swimming pools and hot tubs for three weeks or until your incision has healed completely. °· Follow your doctor's instructions as to safe activities, exercises, and physical therapy.  °· Weight reduction may be beneficial if you are overweight.  °· Daily exercise is helpful to prevent the return of problems. Walking is permitted. You may use a treadmill without an incline. Cut down on activities and exercise if you have discomfort. You may also go up and down stairs as much as you can tolerate.  °· DO NOT lift anything heavier than 10 to 15 lbs. Avoid bending or twisting at the waist. Always bend your knees when lifting.  °· Maintain strength and range of motion as instructed.  °· Do not drive for 10 days, or as directed by your doctors. You may be a passenger . Lying back in the passenger seat may be more comfortable for you. Always wear a seatbelt.  °· Limit your sitting in a regular chair to 20 to 30 minutes at a time. There are no limitations for sitting in a recliner. You should lie down or walk in between sitting periods.  °· Only take  over-the-counter or prescription medicines for pain, discomfort, or fever as directed by your caregiver.  °SEEK MEDICAL CARE IF:  °· There is increased bleeding (more than a small spot) from the wound.  °· You notice redness, swelling, or increasing pain in the wound.  °· Pus is coming from wound.  °· You develop an unexplained oral temperature above 102° F (38.9° C) develops.  °· You notice a foul smell coming from the wound or dressing.  °· You have increasing pain in your wound.  °SEEK IMMEDIATE MEDICAL CARE IF:  °· You develop a rash.  °· You have difficulty breathing.  °· You develop any allergic problems to medicines given.  °Document Released: 06/19/2004 Document Revised: 07/04/2011 Document Reviewed: 10/08/2007 °ExitCare® Patient Information °

## 2018-04-11 ENCOUNTER — Encounter (HOSPITAL_COMMUNITY): Payer: Self-pay | Admitting: Neurosurgery

## 2018-04-14 LAB — CUP PACEART REMOTE DEVICE CHECK
Battery Impedance: 374 Ohm
Battery Voltage: 2.79 V
Brady Statistic AP VS Percent: 2 %
Brady Statistic AS VS Percent: 20 %
Date Time Interrogation Session: 20190828140441
Implantable Lead Location: 753860
Implantable Lead Model: 5076
Implantable Lead Model: 5592
Lead Channel Pacing Threshold Amplitude: 0.375 V
Lead Channel Pacing Threshold Pulse Width: 0.4 ms
Lead Channel Setting Pacing Amplitude: 2 V
Lead Channel Setting Pacing Pulse Width: 0.4 ms
MDC IDC LEAD IMPLANT DT: 20140421
MDC IDC LEAD IMPLANT DT: 20140421
MDC IDC LEAD LOCATION: 753859
MDC IDC MSMT BATTERY REMAINING LONGEVITY: 107 mo
MDC IDC MSMT LEADCHNL RA IMPEDANCE VALUE: 537 Ohm
MDC IDC MSMT LEADCHNL RV IMPEDANCE VALUE: 707 Ohm
MDC IDC MSMT LEADCHNL RV PACING THRESHOLD AMPLITUDE: 0.375 V
MDC IDC MSMT LEADCHNL RV PACING THRESHOLD PULSEWIDTH: 0.4 ms
MDC IDC PG IMPLANT DT: 20140421
MDC IDC SET LEADCHNL RA PACING AMPLITUDE: 1.5 V
MDC IDC SET LEADCHNL RV SENSING SENSITIVITY: 4 mV
MDC IDC STAT BRADY AP VP PERCENT: 11 %
MDC IDC STAT BRADY AS VP PERCENT: 67 %

## 2018-04-27 ENCOUNTER — Other Ambulatory Visit: Payer: Self-pay | Admitting: Internal Medicine

## 2018-04-27 NOTE — Telephone Encounter (Signed)
Pt is overdue for follow-up with Dr Lovena Le last [place] 04/10/17, has upcoming OV scheduled for 06/10/18.  Last labs 04/03/18 Creat 0.95, age 67, weight 83.5kg, CrCl 87.89, based on CrCl pt is on appropriate dosage of Xarelto 20mg  QD.  Will refill rx.

## 2018-05-04 DIAGNOSIS — Z79899 Other long term (current) drug therapy: Secondary | ICD-10-CM | POA: Diagnosis not present

## 2018-05-04 DIAGNOSIS — M0579 Rheumatoid arthritis with rheumatoid factor of multiple sites without organ or systems involvement: Secondary | ICD-10-CM | POA: Diagnosis not present

## 2018-05-23 DIAGNOSIS — Z6826 Body mass index (BMI) 26.0-26.9, adult: Secondary | ICD-10-CM | POA: Diagnosis not present

## 2018-05-23 DIAGNOSIS — R0602 Shortness of breath: Secondary | ICD-10-CM | POA: Diagnosis not present

## 2018-05-23 DIAGNOSIS — J988 Other specified respiratory disorders: Secondary | ICD-10-CM | POA: Diagnosis not present

## 2018-05-23 DIAGNOSIS — J4550 Severe persistent asthma, uncomplicated: Secondary | ICD-10-CM | POA: Diagnosis not present

## 2018-05-23 DIAGNOSIS — R05 Cough: Secondary | ICD-10-CM | POA: Diagnosis not present

## 2018-06-09 DIAGNOSIS — M0579 Rheumatoid arthritis with rheumatoid factor of multiple sites without organ or systems involvement: Secondary | ICD-10-CM | POA: Diagnosis not present

## 2018-06-10 ENCOUNTER — Encounter: Payer: Medicare Other | Admitting: Internal Medicine

## 2018-06-16 ENCOUNTER — Encounter: Payer: Medicare Other | Admitting: Internal Medicine

## 2018-06-24 ENCOUNTER — Telehealth: Payer: Self-pay | Admitting: Cardiology

## 2018-06-24 ENCOUNTER — Ambulatory Visit (INDEPENDENT_AMBULATORY_CARE_PROVIDER_SITE_OTHER): Payer: Medicare Other

## 2018-06-24 DIAGNOSIS — I442 Atrioventricular block, complete: Secondary | ICD-10-CM | POA: Diagnosis not present

## 2018-06-24 NOTE — Telephone Encounter (Signed)
Spoke with pt and reminded pt of remote transmission that is due today. Pt verbalized understanding.   

## 2018-06-24 NOTE — Progress Notes (Signed)
Remote pacemaker transmission.   

## 2018-06-30 DIAGNOSIS — M0579 Rheumatoid arthritis with rheumatoid factor of multiple sites without organ or systems involvement: Secondary | ICD-10-CM | POA: Diagnosis not present

## 2018-06-30 DIAGNOSIS — M2550 Pain in unspecified joint: Secondary | ICD-10-CM | POA: Diagnosis not present

## 2018-06-30 DIAGNOSIS — M5417 Radiculopathy, lumbosacral region: Secondary | ICD-10-CM | POA: Diagnosis not present

## 2018-06-30 DIAGNOSIS — Z6829 Body mass index (BMI) 29.0-29.9, adult: Secondary | ICD-10-CM | POA: Diagnosis not present

## 2018-06-30 DIAGNOSIS — E663 Overweight: Secondary | ICD-10-CM | POA: Diagnosis not present

## 2018-06-30 DIAGNOSIS — Z79899 Other long term (current) drug therapy: Secondary | ICD-10-CM | POA: Diagnosis not present

## 2018-07-02 ENCOUNTER — Encounter: Payer: Self-pay | Admitting: Cardiology

## 2018-07-06 ENCOUNTER — Encounter: Payer: Medicare Other | Admitting: Internal Medicine

## 2018-07-09 DIAGNOSIS — M5416 Radiculopathy, lumbar region: Secondary | ICD-10-CM | POA: Diagnosis not present

## 2018-07-10 DIAGNOSIS — M0579 Rheumatoid arthritis with rheumatoid factor of multiple sites without organ or systems involvement: Secondary | ICD-10-CM | POA: Diagnosis not present

## 2018-07-20 DIAGNOSIS — M4727 Other spondylosis with radiculopathy, lumbosacral region: Secondary | ICD-10-CM | POA: Diagnosis not present

## 2018-07-24 ENCOUNTER — Other Ambulatory Visit (HOSPITAL_COMMUNITY): Payer: Self-pay | Admitting: Neurosurgery

## 2018-07-24 ENCOUNTER — Other Ambulatory Visit: Payer: Self-pay | Admitting: Neurosurgery

## 2018-07-24 DIAGNOSIS — M4727 Other spondylosis with radiculopathy, lumbosacral region: Secondary | ICD-10-CM

## 2018-08-07 ENCOUNTER — Ambulatory Visit (HOSPITAL_COMMUNITY)
Admission: RE | Admit: 2018-08-07 | Discharge: 2018-08-07 | Disposition: A | Discharge status: Home / Self Care | Payer: Medicare Other | Source: Ambulatory Visit | Attending: Neurosurgery | Admitting: Neurosurgery

## 2018-08-07 DIAGNOSIS — M4727 Other spondylosis with radiculopathy, lumbosacral region: Secondary | ICD-10-CM | POA: Diagnosis not present

## 2018-08-07 DIAGNOSIS — M0579 Rheumatoid arthritis with rheumatoid factor of multiple sites without organ or systems involvement: Secondary | ICD-10-CM | POA: Diagnosis not present

## 2018-08-07 DIAGNOSIS — M5126 Other intervertebral disc displacement, lumbar region: Secondary | ICD-10-CM | POA: Diagnosis not present

## 2018-08-13 DIAGNOSIS — Z6829 Body mass index (BMI) 29.0-29.9, adult: Secondary | ICD-10-CM | POA: Diagnosis not present

## 2018-08-13 DIAGNOSIS — M4727 Other spondylosis with radiculopathy, lumbosacral region: Secondary | ICD-10-CM | POA: Diagnosis not present

## 2018-08-13 DIAGNOSIS — I10 Essential (primary) hypertension: Secondary | ICD-10-CM | POA: Diagnosis not present

## 2018-08-14 LAB — CUP PACEART REMOTE DEVICE CHECK
Battery Impedance: 424 Ohm
Battery Voltage: 2.79 V
Brady Statistic AP VP Percent: 18 %
Brady Statistic AP VS Percent: 3 %
Brady Statistic AS VP Percent: 59 %
Implantable Lead Implant Date: 20140421
Implantable Lead Location: 753859
Implantable Lead Model: 5076
Implantable Lead Model: 5592
Lead Channel Impedance Value: 507 Ohm
Lead Channel Impedance Value: 703 Ohm
Lead Channel Pacing Threshold Amplitude: 0.375 V
Lead Channel Pacing Threshold Amplitude: 0.5 V
Lead Channel Pacing Threshold Pulse Width: 0.4 ms
Lead Channel Setting Pacing Amplitude: 2 V
MDC IDC LEAD IMPLANT DT: 20140421
MDC IDC LEAD LOCATION: 753860
MDC IDC MSMT BATTERY REMAINING LONGEVITY: 101 mo
MDC IDC MSMT LEADCHNL RV PACING THRESHOLD PULSEWIDTH: 0.4 ms
MDC IDC PG IMPLANT DT: 20140421
MDC IDC SESS DTM: 20191127172511
MDC IDC SET LEADCHNL RA PACING AMPLITUDE: 1.5 V
MDC IDC SET LEADCHNL RV PACING PULSEWIDTH: 0.4 ms
MDC IDC SET LEADCHNL RV SENSING SENSITIVITY: 5.6 mV
MDC IDC STAT BRADY AS VS PERCENT: 20 %

## 2018-08-17 ENCOUNTER — Other Ambulatory Visit: Payer: Self-pay | Admitting: Internal Medicine

## 2018-08-20 ENCOUNTER — Telehealth: Payer: Self-pay | Admitting: *Deleted

## 2018-08-20 ENCOUNTER — Encounter: Payer: Self-pay | Admitting: Internal Medicine

## 2018-08-20 NOTE — Telephone Encounter (Signed)
Forwarded to Dr. Lovena Le.

## 2018-08-20 NOTE — Telephone Encounter (Signed)
   [facility] Pre-operative Risk Assessment    Request for surgical clearance:  1. What type of surgery is being performed? LEFT L2-3 POSTERIOR LATERAL FUSION, POSSIBLE TRANS FORAMINAL LUMBAR INTERBODY FUSION   2. When is this surgery scheduled? 08/26/18   3. What type of clearance is required (medical clearance vs. Pharmacy clearance to hold med vs. Both)? BOTH  4. Are there any medications that need to be held prior to surgery and how long?[place]   5. Practice name and name of physician performing surgery? [facility]; DR. KYLE CABBELL   6. What is your office phone number [phone]    7.   What is your office fax number [phone]  8.   Anesthesia type (None, local, MAC, general) ? GENERAL    Julaine Hua 08/20/2018, 12:31 PM  _________________________________________________________________   (provider comments below)

## 2018-08-20 NOTE — Telephone Encounter (Signed)
Pt called stating that he's having back surgery on Wed 08/26/2018 and he will be stopping his Xarelto 3 days prior to the surgery. HE wanted to make Dr. Lovena Le aware.   Dr. Christella Noa will be doing the surgery and his office supposedly sent over the paper work for Dr. Lovena Le to fill out.

## 2018-08-20 NOTE — Telephone Encounter (Signed)
Call placed to Dr. Christella Noa office.   Advised scheduler no request for cardiac clearance had been received.  Scheduler states "have faxed it twice"  Gave her device fax # with attn this nurse.  States she will send it again.

## 2018-08-20 NOTE — Telephone Encounter (Signed)
This encounter was created in error - please disregard.

## 2018-08-20 NOTE — Telephone Encounter (Signed)
Spoke with patient and gave appt for tomorrow with [facility] Patient voiced understanding. Appt ok per Lurena Joiner to be seen.

## 2018-08-20 NOTE — Telephone Encounter (Signed)
Clearance request received. Closing this thread.

## 2018-08-20 NOTE — Telephone Encounter (Signed)
Patient with diagnosis of Afib on Xarelto for anticoagulation.    Procedure: Lumbar fusion Date of procedure: 08/26/18  CHADS2-VASc score of  3 (CHF, HTN, AGE, DM2, stroke/tia x 2, CAD, AGE, male)  CrCl *75ml/min  Per office protocol, patient can hold Xarelto for 3 days prior to procedure.

## 2018-08-20 NOTE — Telephone Encounter (Signed)
   Primary Cardiologist: Cristopher Peru, MD  Chart reviewed as part of pre-operative protocol coverage. Because of Adam Watts's past medical history and time since last visit, he/she will require a follow-up visit in order to better assess preoperative cardiovascular risk.  Per pharmacy recommendations, patient can hold xarelto 3 days prior to his upcoming procedure. Xarelto should be restarted once cleared to do so by Dr. Christella Noa.   Pre-op covering staff: - Please schedule appointment and call patient to inform them. - Please contact requesting surgeon's office via preferred method (i.e, phone, fax) to inform them of need for appointment prior to surgery.   Abigail Butts, PA-C 08/20/2018, 3:16 PM

## 2018-08-21 ENCOUNTER — Ambulatory Visit (INDEPENDENT_AMBULATORY_CARE_PROVIDER_SITE_OTHER): Payer: Medicare Other | Admitting: Medical

## 2018-08-21 ENCOUNTER — Encounter: Payer: Self-pay | Admitting: Medical

## 2018-08-21 VITALS — BP 124/72 | HR 88 | Ht 66.5 in | Wt 180.0 lb

## 2018-08-21 DIAGNOSIS — I442 Atrioventricular block, complete: Secondary | ICD-10-CM

## 2018-08-21 DIAGNOSIS — I10 Essential (primary) hypertension: Secondary | ICD-10-CM

## 2018-08-21 DIAGNOSIS — Z01810 Encounter for preprocedural cardiovascular examination: Secondary | ICD-10-CM

## 2018-08-21 DIAGNOSIS — Z950 Presence of cardiac pacemaker: Secondary | ICD-10-CM

## 2018-08-21 DIAGNOSIS — I480 Paroxysmal atrial fibrillation: Secondary | ICD-10-CM | POA: Diagnosis not present

## 2018-08-21 NOTE — Progress Notes (Signed)
Cardiology Office Note   Date:  08/21/2018   ID:  EKAM BESSON, DOB 21-Apr-1951, MRN [id number]  PCP:  Sinda Du, MD  Cardiologist:  Cristopher Peru, MD EP: None  Chief Complaint  Patient presents with  . Pre-op Exam      History of Present Illness: Adam Watts is a 68 y.o. male with PMH of paroxysmal atrial fibrillation, HTN, CHB s/p PPM, RA, GERD, and chronic back pain, who presents for preoperative evaluation.  He was last seen by cardiology, Dr. Lovena Le, outpatient 03/2017 at which time he was doing well from a cardiac standpoint. No medication changes occurred at this visit. Last PPM interrogation 05/2018 revealed a 7.7% AF burden and normal PPM function.   He returns today for preoperative assessment for upcoming lumbar fusion surgery scheduled for 08/26/2018. He reports doing well from a cardiac standpoint since his last visit with Dr. Lovena Le 03/2017. No complaints of chest pain, SOB, DOE, palpitations, dizziness, lightheadedness, or syncope. Only complaint at this time is back pain which has significantly limited his activity. He reports being an active person at baseline and he has been struggling with depression due to his limitations. He is hopeful his upcoming spinal surgery will correct his issue.     Past Medical History:  Diagnosis Date  . A-fib ([place])   . Arthritis    RA all over  . Chest pain   . Chronic back pain   . Dysrhythmia    a-fib  . GERD (gastroesophageal reflux disease)   . Hypertension   . Lumbar radiculopathy   . Pacemaker    CHB, PAF  . Pneumonia   . Pre-diabetes   . Smoking     Past Surgical History:  Procedure Laterality Date  . CHOLECYSTECTOMY    . DENTAL SURGERY     implants  . INGUINAL HERNIA REPAIR Bilateral 04/24/2016   Procedure: LAPAROSCOPIC BILATERAL INGUINAL HERNIA REPAIR;  Surgeon: Coralie Keens, MD;  Location: [place];  Service: General;  Laterality: Bilateral;  . INSERTION OF MESH  Bilateral 04/24/2016   Procedure: INSERTION OF MESH;  Surgeon: Coralie Keens, MD;  Location: [place];  Service: General;  Laterality: Bilateral;  . LUMBAR LAMINECTOMY/DECOMPRESSION MICRODISCECTOMY Left 04/10/2018   Procedure: Left Lumbar two-three Laminectomy/Foraminotomy;  Surgeon: Ashok Pall, MD;  Location: [place];  Service: Neurosurgery;  Laterality: Left;  . SHOULDER ARTHROSCOPY Right    x2  . VASECTOMY       Current Outpatient Medications  Medication Sig Dispense Refill  . Abatacept (ORENCIA IV) Inject into the vein every 30 (thirty) days.    . Cinnamon 500 MG capsule Take 1,000 mg by mouth daily.    Marland Kitchen diltiazem (TIAZAC) 360 MG 24 hr capsule TAKE ONE CAPSULE BY MOUTH EVERY DAY 90 capsule 3  . HYDROcodone-acetaminophen (NORCO/VICODIN) 5-325 MG tablet Take one tab po q 4 hrs prn pain 12 tablet 0  . loratadine (CLARITIN) 10 MG tablet Take 10 mg by mouth daily.    Marland Kitchen losartan-hydrochlorothiazide (HYZAAR) 100-12.5 MG per tablet Take 1 tablet by mouth daily.    . metFORMIN (GLUCOPHAGE-XR) 500 MG 24 hr tablet Take 500 mg by mouth 2 (two) times daily with a meal.  12  . Multiple Vitamins-Minerals (MEGA MULTIVITAMIN FOR MEN PO) Take 1 tablet by mouth daily.     Marland Kitchen omeprazole (PRILOSEC) 20 MG capsule Take 20 mg by mouth daily.    . vitamin B-12 (CYANOCOBALAMIN) 1000 MCG tablet Take 1,000 mcg by  mouth daily.    Alveda Reasons 20 MG TABS tablet TAKE ONE TABLET BY MOUTH WITH SUPPER 90 tablet 1   No current facility-administered medications for this visit.    Facility-Administered Medications Ordered in Other Visits  Medication Dose Route Frequency Provider Last Rate Last Dose  . ondansetron (ZOFRAN) 4 mg in sodium chloride 0.9 % 50 mL IVPB  4 mg Intravenous Q6H PRN Ashok Pall, MD        Allergies:   Patient has no known allergies.    Social History:  The patient  reports that he has been smoking. He has been smoking about 0.50 packs per day. He has never used smokeless  tobacco. He reports current alcohol use. He reports that he does not use drugs.   Family History:  The patient's family history is not on file.    ROS:  Please see the history of present illness.   Otherwise, review of systems are positive for none.   All other systems are reviewed and negative.    PHYSICAL EXAM: VS:  BP 124/72   Pulse 88   Ht 5' 6.5" (1.689 m)   Wt 180 lb (81.6 kg)   BMI 28.62 kg/m  , BMI Body mass index is 28.62 kg/m. GEN: Well nourished, well developed, in no acute distress HEENT: normal Neck: no JVD, carotid bruits, or masses Cardiac: RRR; no murmurs, rubs, or gallops, no edema  Respiratory:  clear to auscultation bilaterally, normal work of breathing GI: soft, nontender, nondistended, + BS MS: no deformity or atrophy Skin: warm and dry, no rash Neuro:  Strength and sensation are intact Psych: euthymic mood, full affect   EKG:  EKG is ordered today. The ekg ordered today demonstrates AV paced rhythm   Recent Labs: 04/03/2018: BUN 9; Creatinine, Ser 0.95; Hemoglobin 12.3; Platelets 303; Potassium 3.5; Sodium 137    Lipid Panel No results found for: CHOL, TRIG, HDL, CHOLHDL, VLDL, LDLCALC, LDLDIRECT    Wt Readings from Last 3 Encounters:  08/21/18 180 lb (81.6 kg)  04/10/18 181 lb 15.8 oz (82.6 kg)  04/03/18 182 lb (82.6 kg)      Other studies Reviewed: Additional studies/ records that were reviewed today include: None   ASSESSMENT AND PLAN:  1. Preoperative assessment: patient reports doing well from a cardiac standpoint since his last visit. No anginal complaints. Significantly limited in mobility due to back pain. No prior CAD history. His Revised cardiac risk index score is 0 with a 0.4% chance of adverse cardiac events in the perioperative setting. - Based on ACC/AHA guidelines, AMY BELLOSO would be at acceptable risk for the planned procedure without further cardiovascular testing.  - Per pharmacy recommendations/office protocol,  patient may hold his xarelto 3 days prior to his upcoming back surgery. He should restart this medication as soon as he is cleared to do so by Dr. Christella Noa.   2. Paroxysmal atrial fibrillation: low AF burden on last PPM interrogation 05/2018. No complaints of bleeding on xarelto - Continue xarelto for stroke ppx with interruptions detailed above. He will hold this medication starting Sunday 08/23/2018 and resume once cleared to do so by Dr. Christella Noa - Continue diltiazem for rate control   3. HTN: BP well controlled - Continue losartan-HCTZ and diltiazem  4. CHB s/p PPM: last PPM interrogation 05/2018 with normal functioning pacemaker - Continue routine monitoring and follow-up with Dr. Lovena Le   Will route this note to Dr. Lacy Duverney office to serve as preoperative clearance for upcoming lumbar spinal  fusion scheduled for 08/26/2018.     Current medicines are reviewed at length with the patient today.  The patient does not have concerns regarding medicines.  The following changes have been made:  no change  Labs/ tests ordered today include:   Orders Placed This Encounter  Procedures  . EKG 12-Lead     Disposition:   FU with Dr. Lovena Le as previously scheduled in March 2020.    Signed, Abigail Butts, PA-C  08/21/2018 3:04 PM

## 2018-08-21 NOTE — Patient Instructions (Signed)
Medication Instructions:  Your physician recommends that you continue on your current medications as directed. Please refer to the Current Medication list given to you today. If you need a refill on your cardiac medications before your next appointment, please call your pharmacy.   Lab work: None  If you have labs (blood work) drawn today and your tests are completely normal, you will receive your results only by: Marland Kitchen MyChart Message (if you have MyChart) OR . A paper copy in the mail If you have any lab test that is abnormal or we need to change your treatment, we will call you to review the results.  Testing/Procedures: None   Follow-Up: At [facility], you and your health needs are our priority.  As part of our continuing mission to provide you with exceptional heart care, we have created designated Provider Care Teams.  These Care Teams include your primary Cardiologist (physician) and Advanced Practice Providers (APPs -  Physician Assistants and Nurse Practitioners) who all work together to provide you with the care you need, when you need it. Marland Kitchen KRISTA RECOMMENDS YOU FOLLOW UP AS SCHEDULED WITH DR Lovena Le.  Any Other Special Instructions Will Be Listed Below (If Applicable).

## 2018-08-26 DIAGNOSIS — M4726 Other spondylosis with radiculopathy, lumbar region: Secondary | ICD-10-CM | POA: Diagnosis not present

## 2018-08-26 DIAGNOSIS — M2578 Osteophyte, vertebrae: Secondary | ICD-10-CM | POA: Diagnosis not present

## 2018-08-26 DIAGNOSIS — M48061 Spinal stenosis, lumbar region without neurogenic claudication: Secondary | ICD-10-CM | POA: Diagnosis not present

## 2018-09-07 DIAGNOSIS — M4727 Other spondylosis with radiculopathy, lumbosacral region: Secondary | ICD-10-CM | POA: Diagnosis not present

## 2018-09-14 ENCOUNTER — Emergency Department (HOSPITAL_COMMUNITY): Payer: Medicare Other

## 2018-09-14 ENCOUNTER — Other Ambulatory Visit: Payer: Self-pay

## 2018-09-14 ENCOUNTER — Encounter (HOSPITAL_COMMUNITY): Payer: Self-pay

## 2018-09-14 ENCOUNTER — Emergency Department (HOSPITAL_COMMUNITY)
Admission: EM | Admit: 2018-09-14 | Discharge: 2018-09-14 | Disposition: A | Discharge status: Home / Self Care | Payer: Medicare Other | Attending: Emergency Medicine | Admitting: Emergency Medicine

## 2018-09-14 DIAGNOSIS — Y9289 Other specified places as the place of occurrence of the external cause: Secondary | ICD-10-CM | POA: Diagnosis not present

## 2018-09-14 DIAGNOSIS — Y999 Unspecified external cause status: Secondary | ICD-10-CM | POA: Diagnosis not present

## 2018-09-14 DIAGNOSIS — X500XXA Overexertion from strenuous movement or load, initial encounter: Secondary | ICD-10-CM | POA: Insufficient documentation

## 2018-09-14 DIAGNOSIS — Z79899 Other long term (current) drug therapy: Secondary | ICD-10-CM | POA: Diagnosis not present

## 2018-09-14 DIAGNOSIS — S46002A Unspecified injury of muscle(s) and tendon(s) of the rotator cuff of left shoulder, initial encounter: Secondary | ICD-10-CM

## 2018-09-14 DIAGNOSIS — M25512 Pain in left shoulder: Secondary | ICD-10-CM | POA: Diagnosis present

## 2018-09-14 DIAGNOSIS — I10 Essential (primary) hypertension: Secondary | ICD-10-CM | POA: Insufficient documentation

## 2018-09-14 DIAGNOSIS — M069 Rheumatoid arthritis, unspecified: Secondary | ICD-10-CM | POA: Insufficient documentation

## 2018-09-14 DIAGNOSIS — E119 Type 2 diabetes mellitus without complications: Secondary | ICD-10-CM | POA: Insufficient documentation

## 2018-09-14 DIAGNOSIS — Z950 Presence of cardiac pacemaker: Secondary | ICD-10-CM | POA: Insufficient documentation

## 2018-09-14 DIAGNOSIS — Z7984 Long term (current) use of oral hypoglycemic drugs: Secondary | ICD-10-CM | POA: Insufficient documentation

## 2018-09-14 DIAGNOSIS — S46092A Other injury of muscle(s) and tendon(s) of the rotator cuff of left shoulder, initial encounter: Secondary | ICD-10-CM | POA: Insufficient documentation

## 2018-09-14 DIAGNOSIS — Y9389 Activity, other specified: Secondary | ICD-10-CM | POA: Diagnosis not present

## 2018-09-14 DIAGNOSIS — F17210 Nicotine dependence, cigarettes, uncomplicated: Secondary | ICD-10-CM | POA: Insufficient documentation

## 2018-09-14 MED ORDER — OXYCODONE HCL 5 MG PO TABS: 5.0000 mg | ORAL_TABLET | ORAL | 0 refills | Status: AC | PRN

## 2018-09-14 NOTE — ED Provider Notes (Signed)
[facility] EMERGENCY DEPARTMENT Provider Note   CSN: [id number] Arrival date & time: 09/14/18  1050     History   Chief Complaint Chief Complaint  Patient presents with  . Shoulder Pain    HPI Adam Watts is a 68 y.o. male.  68yo male presents with complaint of left shoulder pain. Patient states pain starting 4 days ago when he was lifting a bag of groceries onto the counter.  Patient reports feeling a tearing sensation in his left shoulder with subsequent pain and left shoulder limited range of motion. Reports prior right shoulder surgery/rotator surgery at [facility], states he can't have an MRI due to pacemaker, requesting [place] ortho referral. Denies recent antibiotic (quinolone) or steroid use. Patient is taking Norco 5/325 post lumbar surgery, states he is having difficulty sleeping at night due to pain in his shoulder not controlled with current pain medication. No other injuries, complaints, concerns.      Past Medical History:  Diagnosis Date  . A-fib ([place])   . Arthritis    RA all over  . Chest pain   . Chronic back pain   . Dysrhythmia    a-fib  . GERD (gastroesophageal reflux disease)   . Hypertension   . Lumbar radiculopathy   . Pacemaker    CHB, PAF  . Pneumonia   . Pre-diabetes   . Smoking     Patient Active Problem List   Diagnosis Date Noted  . Lumbar foraminal stenosis 04/10/2018  . Prediabetes 03/05/2016  . Rheumatoid arthritis involving both hands with negative rheumatoid factor ([place]) 03/05/2016  . Sepsis due to pneumonia ([place]) 03/04/2016  . Fracture of lateral malleolus 02/11/2015  . Paroxysmal atrial fibrillation ([place]) 08/01/2014  . Chest pain 06/14/2013  . Tobacco use disorder 06/14/2013  . Essential hypertension 06/10/2013  . Pacemaker 06/10/2013  . DM (diabetes mellitus) ([place]) 06/10/2013    Past Surgical History:  Procedure Laterality Date  . CHOLECYSTECTOMY    . DENTAL SURGERY     implants  . INGUINAL HERNIA REPAIR  Bilateral 04/24/2016   Procedure: LAPAROSCOPIC BILATERAL INGUINAL HERNIA REPAIR;  Surgeon: Coralie Keens, MD;  Location: [place];  Service: General;  Laterality: Bilateral;  . INSERTION OF MESH Bilateral 04/24/2016   Procedure: INSERTION OF MESH;  Surgeon: Coralie Keens, MD;  Location: [place];  Service: General;  Laterality: Bilateral;  . LUMBAR LAMINECTOMY/DECOMPRESSION MICRODISCECTOMY Left 04/10/2018   Procedure: Left Lumbar two-three Laminectomy/Foraminotomy;  Surgeon: Ashok Pall, MD;  Location: [place];  Service: Neurosurgery;  Laterality: Left;  . SHOULDER ARTHROSCOPY Right    x2  . VASECTOMY          Home Medications    Prior to Admission medications   Medication Sig Start Date End Date Taking? Authorizing Provider  Abatacept (ORENCIA IV) Inject into the vein every 30 (thirty) days.    [provider]  Cinnamon 500 MG capsule Take 1,000 mg by mouth daily.    [provider]  diltiazem [facility]) 360 MG 24 hr capsule TAKE ONE CAPSULE BY MOUTH EVERY DAY 08/17/18   Evans Lance, MD  HYDROcodone-acetaminophen (NORCO/VICODIN) 5-325 MG tablet Take one tab po q 4 hrs prn pain 01/12/18   Triplett, Tammy, PA-C  loratadine (CLARITIN) 10 MG tablet Take 10 mg by mouth daily.    [provider]  losartan-hydrochlorothiazide (HYZAAR) 100-12.5 MG per tablet Take 1 tablet by mouth daily.    [provider]  metFORMIN (GLUCOPHAGE-XR) 500 MG 24 hr  tablet Take 500 mg by mouth 2 (two) times daily with a meal. 11/28/17   [provider]  Multiple Vitamins-Minerals (MEGA MULTIVITAMIN FOR MEN PO) Take 1 tablet by mouth daily.     [provider]  omeprazole (PRILOSEC) 20 MG capsule Take 20 mg by mouth daily.    [provider]  oxyCODONE (ROXICODONE) 5 MG immediate release tablet Take 1 tablet (5 mg total) by mouth every 4 (four) hours as needed for up to 3 days for severe pain. 09/14/18 09/17/18  Tacy Learn, PA-C  vitamin B-12 (CYANOCOBALAMIN) 1000 MCG tablet Take 1,000 mcg by mouth daily.    [provider]  XARELTO 20 MG TABS tablet TAKE ONE TABLET BY MOUTH WITH SUPPER 04/27/18   Evans Lance, MD    Family History No family history on file.  Social History Social History   Tobacco Use  . Smoking status: Current Every Day Smoker    Packs/day: 0.50  . Smokeless tobacco: Never Used  Substance Use Topics  . Alcohol use: Yes    Comment: social  . Drug use: No     Allergies   Patient has no known allergies.   Review of Systems Review of Systems  Constitutional: Negative for fever.  Musculoskeletal: Positive for arthralgias. Negative for joint swelling and neck pain.  Skin: Negative for color change, rash and wound.  Allergic/Immunologic: Positive for immunocompromised state.  Neurological: Negative for weakness and numbness.  Hematological: Bruises/bleeds easily.     Physical Exam Updated Vital Signs BP 140/72 (BP Location: Right Arm)   Pulse 92   Temp 98 F (36.7 C) (Oral)   Resp 16   Ht 5\' 6"  (1.676 m)   Wt 81.6 kg   SpO2 98%   BMI 29.05 kg/m   Physical Exam Vitals signs and nursing note reviewed.  Constitutional:      General: He is not in acute distress.    Appearance: He is well-developed. He is not diaphoretic.  HENT:     Head: Normocephalic and atraumatic.  Cardiovascular:     Pulses: Normal pulses.  Pulmonary:     Effort: Pulmonary effort is normal.  Musculoskeletal:        General: Tenderness and signs of injury present. No swelling or deformity.     Left shoulder: He exhibits decreased range of motion, tenderness and pain. He exhibits no bony tenderness, no swelling, no effusion, no crepitus, no deformity and normal pulse.     Left elbow: Normal.     Left wrist: Normal.       Back:       Arms:  Skin:    General: Skin is warm and dry.     Findings: No erythema or rash.  Neurological:     Mental Status: He is alert and  oriented to person, place, and time.  Psychiatric:        Behavior: Behavior normal.      ED Treatments / Results  Labs (all labs ordered are listed, but only abnormal results are displayed) Labs Reviewed - No data to display  EKG None  Radiology No results found.  Procedures Procedures (including critical care time)  Medications Ordered in ED Medications - No data to display   Initial Impression / Assessment and Plan / ED Course  I have reviewed the triage vital signs and the nursing notes.  Pertinent labs & imaging results that were available during my care of the patient were reviewed by me and  considered in my medical decision making (see chart for details).  Clinical Course as of Sep 14 1225  Mon Sep 14, 3344  6314 68 year old male with a left shoulder pain x4 days.  Pain onset after lifting a bag of canned goods onto the countertop, reports a ripping sensation in his left shoulder at that time and has had pain with limited range of motion the shoulder since.  Patient suspects he has a rotator cuff injury, is unable to have an MRI due to his pacemaker.  Offered patient shoulder x-ray, discussed limitations of the study.  Patient declines x-ray today and requests referral to orthopedics in [place].  Patient has tenderness to the medial border of the scapula as well as posterior left shoulder/suspect injury to rotator cuff, pain worse with abduction and forward extension of shoulder.  Patient given referral to orthopedics, states his [place] [address] is not controlling his pain at night and is making sleep difficult.  Patient was given prescription for oxycodone to take in place of his Norco at bedtime.  Patient plans to call Ortho today to arrange follow-up.   [LM]    Clinical Course User Index [LM] Tacy Learn, PA-C   Final Clinical Impressions(s) / ED Diagnoses   Final diagnoses:  Injury of left rotator cuff, initial encounter    ED Discharge Orders          Ordered    oxyCODONE (ROXICODONE) 5 MG immediate release tablet  Every 4 hours PRN     09/14/18 1204           Roque Lias 09/14/18 1227    Nat Christen, MD 09/15/18 [id number]

## 2018-09-14 NOTE — ED Triage Notes (Signed)
Pt reports that he was picking up groceries and putting on counter and heard tearing sound in left shoulder. Pt reports this occurred  On thursday

## 2018-09-14 NOTE — Discharge Instructions (Addendum)
Take Oxycodone for pain if needed as prescribed. This may cause constipation, take Colace if needed. Limited use of sling, remove arm for range of motion exercised as discussed. Follow up with orthopedics for further evaluation.

## 2018-09-15 ENCOUNTER — Other Ambulatory Visit: Payer: Self-pay | Admitting: Orthopedic Surgery

## 2018-09-15 ENCOUNTER — Telehealth: Payer: Self-pay | Admitting: *Deleted

## 2018-09-15 DIAGNOSIS — M25512 Pain in left shoulder: Secondary | ICD-10-CM | POA: Diagnosis not present

## 2018-09-15 DIAGNOSIS — G8929 Other chronic pain: Secondary | ICD-10-CM

## 2018-09-15 NOTE — Telephone Encounter (Signed)
   Primary Cardiologist:Gregg Lovena Le, MD  Chart reviewed as part of pre-operative protocol coverage. Because of Orland Visconti Habig's past medical history and time since last visit, he/she will require a follow-up visit in order to better assess preoperative cardiovascular risk.  Pre-op covering staff: - Please schedule appointment and call patient to inform them. - Please contact requesting surgeon's office via preferred method (i.e, phone, fax) to inform them of need for appointment prior to surgery.  Last seen in 2018. Needs OV with EP APP or Dr. Lovena Le.   [place], [place]  09/15/2018, 3:20 PM

## 2018-09-15 NOTE — Telephone Encounter (Signed)
Patient with diagnosis of afib on xarelto for anticoagulation.    Procedure: CT ARTHROGRAM LEFT SHOULDER  Date of procedure: TBD  CHADS2-VASc score of  3 (CHF, HTN, AGE, DM2, stroke/tia x 2, CAD, AGE, male)  Per office protocol, patient can hold xaretlo for 1 days prior to procedure.

## 2018-09-15 NOTE — Telephone Encounter (Signed)
   [facility] Pre-operative Risk Assessment    Request for surgical clearance:  1. What type of surgery is being performed? CT ARTHROGRAM LEFT SHOULDER   2. When is this surgery scheduled? TBD   3. What type of clearance is required (medical clearance vs. Pharmacy clearance to hold med vs. Both)? BOTH  4. Are there any medications that need to be held prior to surgery and how long?XARELTO X 1 DAY PRIOR   5. Practice name and name of physician performing surgery? [facility]    6. What is your office phone number [phone]    7.   What is your office fax number [phone]  8.   Anesthesia type (None, local, MAC, general) ? NONE LISTED    Julaine Hua 09/15/2018, 2:11 PM  _________________________________________________________________   (provider comments below)

## 2018-09-15 NOTE — Telephone Encounter (Signed)
Pt has been scheduled to see Dr. Lovena Le, 09/18/2018 @ 12:30, with advice to arrive 15 mins early for registration

## 2018-09-17 DIAGNOSIS — M0579 Rheumatoid arthritis with rheumatoid factor of multiple sites without organ or systems involvement: Secondary | ICD-10-CM | POA: Diagnosis not present

## 2018-09-18 ENCOUNTER — Ambulatory Visit (INDEPENDENT_AMBULATORY_CARE_PROVIDER_SITE_OTHER): Payer: Medicare Other | Admitting: Internal Medicine

## 2018-09-18 ENCOUNTER — Encounter: Payer: Self-pay | Admitting: Internal Medicine

## 2018-09-18 VITALS — BP 126/62 | HR 89 | Ht 66.0 in | Wt 181.0 lb

## 2018-09-18 DIAGNOSIS — I442 Atrioventricular block, complete: Secondary | ICD-10-CM

## 2018-09-18 DIAGNOSIS — Z950 Presence of cardiac pacemaker: Secondary | ICD-10-CM

## 2018-09-18 DIAGNOSIS — I480 Paroxysmal atrial fibrillation: Secondary | ICD-10-CM | POA: Diagnosis not present

## 2018-09-18 DIAGNOSIS — I10 Essential (primary) hypertension: Secondary | ICD-10-CM | POA: Diagnosis not present

## 2018-09-18 NOTE — Progress Notes (Signed)
HPI Adam Watts returns today for ongoing evaluation and management of his PAF, CHB, s/p PPM insertion. In the interim, he denies chest pain or sob. He has not been hospitalized. He does not like taking xarelto. He does not have palpitations. He is pending shoulder surgery.  No Known Allergies   Current Outpatient Medications  Medication Sig Dispense Refill  . Abatacept (ORENCIA IV) Inject into the vein every 30 (thirty) days.    . Cinnamon 500 MG capsule Take 1,000 mg by mouth daily.    Marland Kitchen diltiazem (TIAZAC) 360 MG 24 hr capsule TAKE ONE CAPSULE BY MOUTH EVERY DAY 90 capsule 3  . HYDROcodone-acetaminophen (NORCO/VICODIN) 5-325 MG tablet Take one tab po q 4 hrs prn pain 12 tablet 0  . loratadine (CLARITIN) 10 MG tablet Take 10 mg by mouth daily.    Marland Kitchen losartan-hydrochlorothiazide (HYZAAR) 100-12.5 MG per tablet Take 1 tablet by mouth daily.    . metFORMIN (GLUCOPHAGE-XR) 500 MG 24 hr tablet Take 500 mg by mouth 2 (two) times daily with a meal.  12  . Multiple Vitamins-Minerals (MEGA MULTIVITAMIN FOR MEN PO) Take 1 tablet by mouth daily.     Marland Kitchen omeprazole (PRILOSEC) 20 MG capsule Take 20 mg by mouth daily.    . vitamin B-12 (CYANOCOBALAMIN) 1000 MCG tablet Take 1,000 mcg by mouth daily.    Alveda Reasons 20 MG TABS tablet TAKE ONE TABLET BY MOUTH WITH SUPPER 90 tablet 1   No current facility-administered medications for this visit.    Facility-Administered Medications Ordered in Other Visits  Medication Dose Route Frequency Provider Last Rate Last Dose  . ondansetron (ZOFRAN) 4 mg in sodium chloride 0.9 % 50 mL IVPB  4 mg Intravenous Q6H PRN Ashok Pall, MD         Past Medical History:  Diagnosis Date  . A-fib ([place])   . Arthritis    RA all over  . Chest pain   . Chronic back pain   . Dysrhythmia    a-fib  . GERD (gastroesophageal reflux disease)   . Hypertension   . Lumbar radiculopathy   . Pacemaker    CHB, PAF  . Pneumonia   . Pre-diabetes   . Smoking      ROS:   All systems reviewed and negative except as noted in the HPI.   Past Surgical History:  Procedure Laterality Date  . CHOLECYSTECTOMY    . DENTAL SURGERY     implants  . INGUINAL HERNIA REPAIR Bilateral 04/24/2016   Procedure: LAPAROSCOPIC BILATERAL INGUINAL HERNIA REPAIR;  Surgeon: Coralie Keens, MD;  Location: [facility];  Service: General;  Laterality: Bilateral;  . INSERTION OF MESH Bilateral 04/24/2016   Procedure: INSERTION OF MESH;  Surgeon: Coralie Keens, MD;  Location: [facility];  Service: General;  Laterality: Bilateral;  . LUMBAR LAMINECTOMY/DECOMPRESSION MICRODISCECTOMY Left 04/10/2018   Procedure: Left Lumbar two-three Laminectomy/Foraminotomy;  Surgeon: Ashok Pall, MD;  Location: [facility];  Service: Neurosurgery;  Laterality: Left;  . SHOULDER ARTHROSCOPY Right    x2  . VASECTOMY       No family history on file.   Social History   Socioeconomic History  . Marital status: Married    Spouse name: Not on file  . Number of children: Not on file  . Years of education: Not on file  . Highest education level: Not on file  Occupational History  . Not on file  Social Needs  . Emergency planning/management officer  strain: Not on file  . Food insecurity:    Worry: Not on file    Inability: Not on file  . Transportation needs:    Medical: Not on file    Non-medical: Not on file  Tobacco Use  . Smoking status: Current Every Day Smoker    Packs/day: 0.50  . Smokeless tobacco: Never Used  Substance and Sexual Activity  . Alcohol use: Yes    Comment: social  . Drug use: No  . Sexual activity: Not on file  Lifestyle  . Physical activity:    Days per week: Not on file    Minutes per session: Not on file  . Stress: Not on file  Relationships  . Social connections:    Talks on phone: Not on file    Gets together: Not on file    Attends religious service: Not on file    Active member of club or organization: Not on file    Attends  meetings of clubs or organizations: Not on file    Relationship status: Not on file  . Intimate partner violence:    Fear of current or ex partner: Not on file    Emotionally abused: Not on file    Physically abused: Not on file    Forced sexual activity: Not on file  Other Topics Concern  . Not on file  Social History Narrative  . Not on file     BP 126/62   Pulse 89   Ht 5\' 6"  (1.676 m)   Wt 181 lb (82.1 kg)   BMI 29.21 kg/m   Physical Exam:  Well appearing NAD HEENT: Unremarkable Neck:  No JVD, no thyromegally Lymphatics:  No adenopathy Back:  No CVA tenderness Lungs:  Clear with no wheezes HEART:  Regular rate rhythm, no murmurs, no rubs, no clicks Abd:  soft, positive bowel sounds, no organomegally, no rebound, no guarding Ext:  2 plus pulses, no edema, no cyanosis, no clubbing Skin:  No rashes no nodules Neuro:  CN II through XII intact, motor grossly intact  EKG - nsr with AV pacing  DEVICE  Normal device function.  See PaceArt for details.   Assess/Plan: 1. CHB - he is asymptomatic, s/p PPM insertion. 2. 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 3. PAF - he is asymptomatic and tolerating his systemic anti-coag. 4. Preoperative eval- he is pending shoulder injection. He can hold his systemic anti-coagulation for up to 5 days, though ideally 2-3. Restart when ok with the surgeon doing the injection. I explained to him that the risk of a stroke from holding the blood thinner is between 07/998 and 1/10000.   Cristopher Peru, M.D.

## 2018-09-18 NOTE — Patient Instructions (Signed)
Medication Instructions:  Your physician recommends that you continue on your current medications as directed. Please refer to the Current Medication list given to you today.  Labwork: None ordered.  Testing/Procedures: None ordered.  Follow-Up: Your physician wants you to follow-up in: one year with Dr. Lovena Le.   You will receive a reminder letter in the mail two months in advance. If you don't receive a letter, please call our office to schedule the follow-up appointment.  Remote monitoring is used to monitor your Pacemaker from home. This monitoring reduces the number of office visits required to check your device to one time per year. It allows [place] to keep an eye on the functioning of your device to ensure it is working properly. You are scheduled for a device check from home on 12/22/2018. You may send your transmission at any time that day. If you have a wireless device, the transmission will be sent automatically. After your physician reviews your transmission, you will receive a postcard with your next transmission date.  Any Other Special Instructions Will Be Listed Below (If Applicable).  If you need a refill on your cardiac medications before your next appointment, please call your pharmacy.

## 2018-09-21 ENCOUNTER — Telehealth: Payer: Self-pay | Admitting: Internal Medicine

## 2018-09-21 LAB — CUP PACEART INCLINIC DEVICE CHECK
Date Time Interrogation Session: 20200224084456
Implantable Lead Implant Date: 20140421
Implantable Lead Implant Date: 20140421
Implantable Lead Location: 753859
Implantable Lead Model: 5592
Implantable Pulse Generator Implant Date: 20140421
MDC IDC LEAD LOCATION: 753860

## 2018-09-21 NOTE — Telephone Encounter (Signed)
Dr. Tanna Furry last office note faxed to [place] at [phone].

## 2018-09-21 NOTE — Telephone Encounter (Signed)
Pt is needing to hold his Xarelto for his upcoming CT at [facility], he needs a letter sent to them from Dr. Lovena Le

## 2018-09-25 ENCOUNTER — Other Ambulatory Visit: Payer: Medicare Other

## 2018-09-28 ENCOUNTER — Ambulatory Visit
Admission: RE | Admit: 2018-09-28 | Discharge: 2018-09-28 | Disposition: A | Discharge status: Home / Self Care | Payer: Medicare Other | Source: Ambulatory Visit | Attending: Orthopedic Surgery | Admitting: Orthopedic Surgery

## 2018-09-28 DIAGNOSIS — M25512 Pain in left shoulder: Principal | ICD-10-CM

## 2018-09-28 DIAGNOSIS — S46012A Strain of muscle(s) and tendon(s) of the rotator cuff of left shoulder, initial encounter: Secondary | ICD-10-CM | POA: Diagnosis not present

## 2018-09-28 DIAGNOSIS — G8929 Other chronic pain: Secondary | ICD-10-CM

## 2018-09-28 MED ORDER — IOPAMIDOL (ISOVUE-M 200) INJECTION 41%
12.0000 mL | Freq: Once | INTRAMUSCULAR | Status: AC
  Administered 2018-09-28: 12 mL via INTRA_ARTICULAR

## 2018-09-29 DIAGNOSIS — Z6829 Body mass index (BMI) 29.0-29.9, adult: Secondary | ICD-10-CM | POA: Diagnosis not present

## 2018-09-29 DIAGNOSIS — G478 Other sleep disorders: Secondary | ICD-10-CM | POA: Diagnosis not present

## 2018-09-29 DIAGNOSIS — M0579 Rheumatoid arthritis with rheumatoid factor of multiple sites without organ or systems involvement: Secondary | ICD-10-CM | POA: Diagnosis not present

## 2018-09-29 DIAGNOSIS — M2550 Pain in unspecified joint: Secondary | ICD-10-CM | POA: Diagnosis not present

## 2018-09-29 DIAGNOSIS — M25512 Pain in left shoulder: Secondary | ICD-10-CM | POA: Diagnosis not present

## 2018-09-29 DIAGNOSIS — M5417 Radiculopathy, lumbosacral region: Secondary | ICD-10-CM | POA: Diagnosis not present

## 2018-09-29 DIAGNOSIS — Z79899 Other long term (current) drug therapy: Secondary | ICD-10-CM | POA: Diagnosis not present

## 2018-09-29 DIAGNOSIS — E663 Overweight: Secondary | ICD-10-CM | POA: Diagnosis not present

## 2018-10-06 ENCOUNTER — Telehealth: Payer: Self-pay

## 2018-10-06 DIAGNOSIS — M25512 Pain in left shoulder: Secondary | ICD-10-CM | POA: Diagnosis not present

## 2018-10-06 NOTE — Telephone Encounter (Signed)
Patient with diagnosis of afib on xarelto for anticoagulation.    Procedure: Left Total Shoulder Replacement  Date of procedure: TBD  CHADS2-VASc score of  3 (CHF, HTN, AGE, DM2, stroke/tia x 2, CAD, AGE, male)  CrCl 47ml/min  Per office protocol, patient can hold Xarelto for 2-3 days prior to procedure.

## 2018-10-06 NOTE — Telephone Encounter (Signed)
Pharm please address xarelto please, thanks.

## 2018-10-06 NOTE — Telephone Encounter (Signed)
   Primary Cardiologist: Cristopher Peru, MD  Chart reviewed as part of pre-operative protocol coverage. Patient was contacted 10/06/2018 in reference to pre-operative risk assessment for pending surgery as outlined below.  Adam Watts was last seen on 09/18/18 by Dr. Lovena Le.  Since that day, Adam Watts has done well with normal pacemaker function.  He does have PAF.  He may hold his Xarelto for 2-3 days prior to procedure.  Then resume.  .  Therefore, based on ACC/AHA guidelines, the patient would be at acceptable risk for the planned procedure without further cardiovascular testing.   I will route this recommendation to the requesting party via Epic fax function and remove from pre-op pool.  Please call with questions.  Cecilie Kicks, NP 10/06/2018, 3:14 PM

## 2018-10-06 NOTE — Telephone Encounter (Signed)
   [facility] Pre-operative Risk Assessment    Request for surgical clearance:  1. What type of surgery is being performed? Left Total Shoulder Replacement   2. When is this surgery scheduled? TBD   3. What type of clearance is required (medical clearance vs. Pharmacy clearance to hold med vs. Both)? Both  4. Are there any medications that need to be held prior to surgery and how long? Xarelto (Not listed to hold)   5. Practice name and name of physician performing surgery? [facility], Dr. Ophelia Charter   6. What is your office phone number [phone]    7.   What is your office fax number [phone] Attn:Sherri  8.   Anesthesia type (None, local, MAC, general) ? None listed   Jacinta Shoe 10/06/2018, 10:46 AM  _________________________________________________________________   (provider comments below)

## 2018-10-22 ENCOUNTER — Encounter: Payer: Medicare Other | Admitting: Internal Medicine

## 2018-11-13 ENCOUNTER — Other Ambulatory Visit: Payer: Self-pay | Admitting: Internal Medicine

## 2018-11-13 NOTE — Telephone Encounter (Signed)
Pt last saw Dr Lovena Le 09/18/18, last labs 04/03/18 Creat 0.95, age 68, weight 82.1kg, CrCl 85.22.  Based on CrCl pt is on appropriate dosage of Xarelto 20mg  QD.  Will refill rx.

## 2018-11-30 DIAGNOSIS — M0579 Rheumatoid arthritis with rheumatoid factor of multiple sites without organ or systems involvement: Secondary | ICD-10-CM | POA: Diagnosis not present

## 2018-11-30 DIAGNOSIS — Z79899 Other long term (current) drug therapy: Secondary | ICD-10-CM | POA: Diagnosis not present

## 2018-12-22 ENCOUNTER — Ambulatory Visit (INDEPENDENT_AMBULATORY_CARE_PROVIDER_SITE_OTHER): Payer: Medicare Other | Admitting: *Deleted

## 2018-12-22 DIAGNOSIS — I480 Paroxysmal atrial fibrillation: Secondary | ICD-10-CM

## 2018-12-22 DIAGNOSIS — I442 Atrioventricular block, complete: Secondary | ICD-10-CM | POA: Diagnosis not present

## 2018-12-23 ENCOUNTER — Telehealth: Payer: Self-pay | Admitting: Student

## 2018-12-23 LAB — CUP PACEART REMOTE DEVICE CHECK
Battery Impedance: 522 Ohm
Battery Remaining Longevity: 88 mo
Battery Voltage: 2.79 V
Brady Statistic AP VP Percent: 20 %
Brady Statistic AP VS Percent: 1 %
Brady Statistic AS VP Percent: 69 %
Brady Statistic AS VS Percent: 10 %
Date Time Interrogation Session: 20200526120255
Implantable Lead Implant Date: 20140421
Implantable Lead Implant Date: 20140421
Implantable Lead Location: 753859
Implantable Lead Location: 753860
Implantable Lead Model: 5076
Implantable Lead Model: 5592
Implantable Pulse Generator Implant Date: 20140421
Lead Channel Impedance Value: 464 Ohm
Lead Channel Impedance Value: 570 Ohm
Lead Channel Pacing Threshold Amplitude: 0.375 V
Lead Channel Pacing Threshold Amplitude: 0.5 V
Lead Channel Pacing Threshold Pulse Width: 0.4 ms
Lead Channel Pacing Threshold Pulse Width: 0.4 ms
Lead Channel Setting Pacing Amplitude: 1.5 V
Lead Channel Setting Pacing Amplitude: 2 V
Lead Channel Setting Pacing Pulse Width: 0.4 ms
Lead Channel Setting Sensing Sensitivity: 4 mV

## 2018-12-23 NOTE — Telephone Encounter (Signed)
Remote reviewed. Normal device function noted with battery status, lead impedance, sensing and thresholds tested stable for patient. Presenting rhythm AFib with V pacing. Diagnostic data shows increase in AF burden 40%, could be higher. AF trend/log shows paroxysmal episodes, however episode EGM's do not show conversion. No ventricular arrhythmias. [place]- Xarelto  Discussed with patient. Asymptomatic. Increase in arrhythmias may be due to increased back pain. V rates relatively stable. SOB walking up steps which is near baseline for him. Patient has not missed any Xarelto. Knows to call with any worsening symptoms.     Legrand Como [address] [place], PA-C 12/23/2018 9:40 AM

## 2018-12-24 DIAGNOSIS — M5417 Radiculopathy, lumbosacral region: Secondary | ICD-10-CM | POA: Diagnosis not present

## 2018-12-24 DIAGNOSIS — E669 Obesity, unspecified: Secondary | ICD-10-CM | POA: Diagnosis not present

## 2018-12-24 DIAGNOSIS — M0579 Rheumatoid arthritis with rheumatoid factor of multiple sites without organ or systems involvement: Secondary | ICD-10-CM | POA: Diagnosis not present

## 2018-12-24 DIAGNOSIS — Z79899 Other long term (current) drug therapy: Secondary | ICD-10-CM | POA: Diagnosis not present

## 2018-12-24 DIAGNOSIS — D508 Other iron deficiency anemias: Secondary | ICD-10-CM | POA: Diagnosis not present

## 2018-12-24 DIAGNOSIS — M2550 Pain in unspecified joint: Secondary | ICD-10-CM | POA: Diagnosis not present

## 2018-12-24 DIAGNOSIS — E663 Overweight: Secondary | ICD-10-CM | POA: Diagnosis not present

## 2018-12-24 DIAGNOSIS — Z6830 Body mass index (BMI) 30.0-30.9, adult: Secondary | ICD-10-CM | POA: Diagnosis not present

## 2018-12-24 DIAGNOSIS — G478 Other sleep disorders: Secondary | ICD-10-CM | POA: Diagnosis not present

## 2018-12-26 IMAGING — CT CT L SPINE W/ CM
1 of 7 series · 5 of 14 positions shown, 7 images · non-contrast
Comparison: Noncontrast lumbar spine CT 04/13/2017

CLINICAL DATA: Lumbar radiculopathy. Bilateral low back, buttock,
groin, and leg pain.
TECHNIQUE: Contiguous axial images were obtained through the Lumbar spine after
the intrathecal infusion of infusion. Coronal and sagittal
reconstructions were obtained of the axial image sets.

[Series 3: l spine soft · axial · 0.30mm/px · z∈[-274,-130]mm · 5 of 73 slices shown, 7 images]
[im 13/73  soft-tissue]
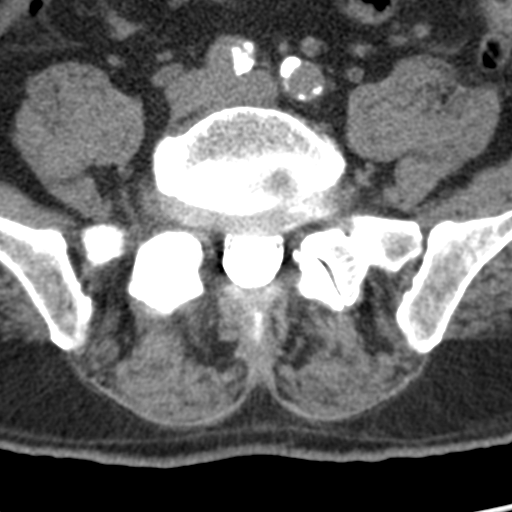
[im 13/73  bone]
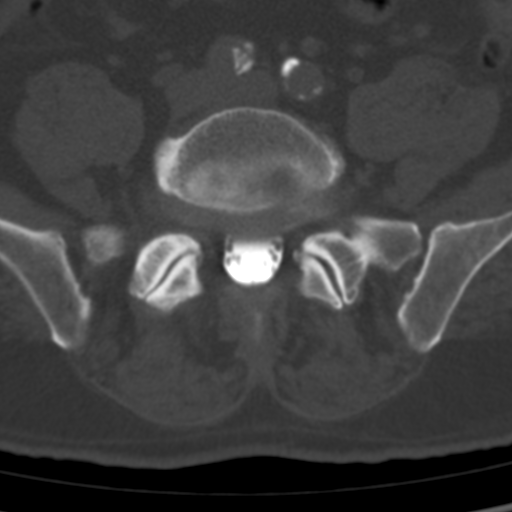
[im 25/73  bone]
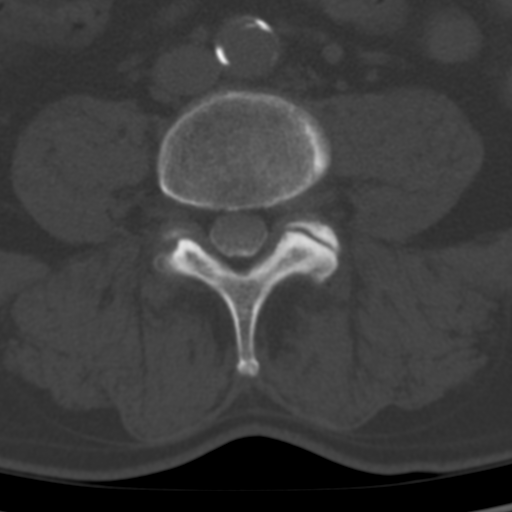
[im 37/73  bone]
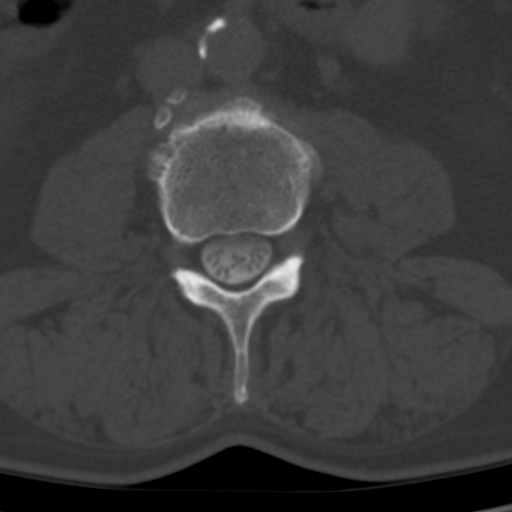
[im 49/73  bone]
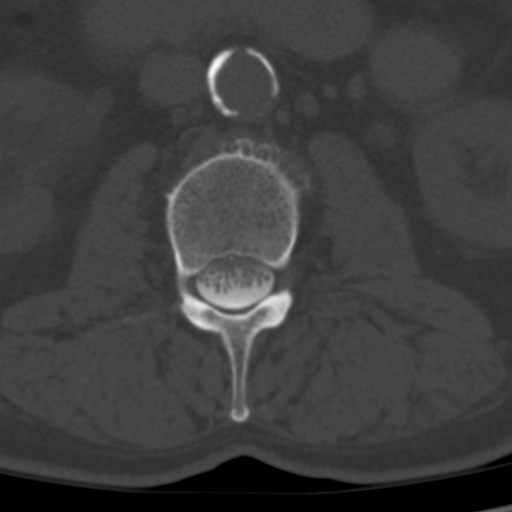
[im 61/73  soft-tissue]
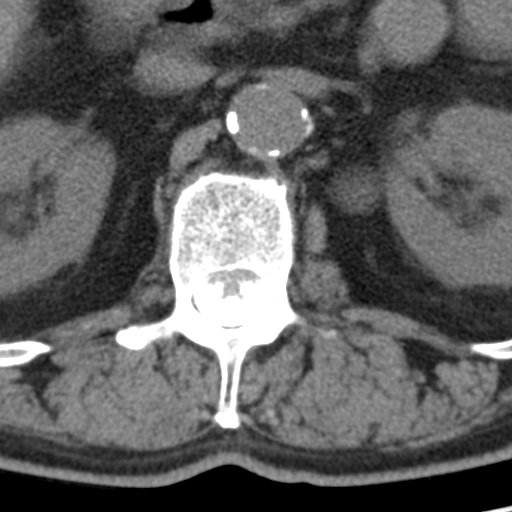
[im 61/73  bone]
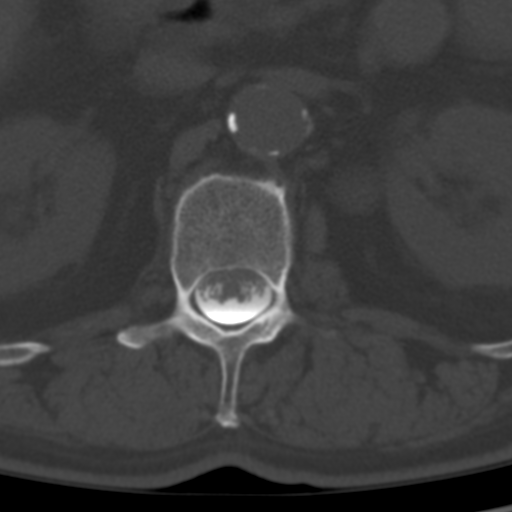

[5 of 14 positions shown; findings below may reference images not displayed]

EXAM:
LUMBAR MYELOGRAM

FLUOROSCOPY TIME:  Radiation Exposure Index (as provided by the
fluoroscopic device): 363.99 microGray*m^2

Fluoroscopy Time (in minutes and seconds):  33 second

PROCEDURE:
After thorough discussion of risks and benefits of the procedure
including bleeding, infection, injury to nerves, blood vessels,
adjacent structures as well as headache and CSF leak, written and
oral informed consent was obtained. Consent was obtained by Dr.
Ejiril Walland. Time out form was completed.

Patient was positioned prone on the fluoroscopy table. Local
anesthesia was provided with 1% lidocaine without epinephrine after
prepped and draped in the usual sterile fashion. Puncture was
performed at L3-4 using a 3 1/2 inch 22-gauge spinal needle via a
left interlaminar approach. Using a single pass through the dura,
the needle was placed within the thecal sac, with return of clear
CSF. 15 mL of Isovue [id number] was injected into the thecal sac, with
normal opacification of the nerve roots and cauda equina consistent
with free flow within the subarachnoid space.

I personally performed the lumbar puncture and administered the
intrathecal contrast. I also personally supervised acquisition of
the myelogram images.
FINDINGS: LUMBAR MYELOGRAM FINDINGS:

Lumbar segmentation is normal. There is trace retrolisthesis of L3
on L4 which does not change with flexion or extension. Ventral
extradural defects contribute to mild spinal stenosis at L3-4 and
L4-5, neither of which significantly worsens with standing. A
smaller ventral extradural defect is present at L2-3 without
evidence of significant stenosis. Aortic atherosclerosis is noted.

CT LUMBAR MYELOGRAM FINDINGS:

There is trace retrolisthesis of L3 on L4. No fracture or suspicious
osseous lesion is identified. The conus medullaris terminates at
L1-2. Renal cysts measure 2.6 cm on the right and 1.6 cm on the
left. There is aortoiliac atherosclerosis without aneurysm.

T12-L1:  Right facet spurring.  No disc herniation or stenosis.

L1-2: Mild left facet arthrosis without disc herniation or stenosis.

L2-3: Circumferential disc bulging, mild ligamentum flavum
thickening, and mild left greater than right facet hypertrophy
result in borderline bilateral lateral recess stenosis without
spinal or neural foraminal stenosis.

L3-4: Circumferential disc bulging, mild facet and ligamentum flavum
hypertrophy, and prominent dorsal epidural fat result in borderline
to mild spinal stenosis and mild bilateral neural foraminal
stenosis.

L4-5: Circumferential disc bulging and mild facet and ligamentum
flavum hypertrophy result in borderline to mild spinal and lateral
recess stenosis and mild right and mild-to-moderate left neural
foraminal stenosis.

L5-S1: Disc bulging, right foraminal disc protrusion, and
mild-to-moderate facet hypertrophy result in moderate to severe
right and mild left neural foraminal stenosis without spinal
stenosis. Potential right L5 nerve root impingement.

Findings are overall unchanged from the prior CT.
IMPRESSION: 1. Diffuse lumbar disc degeneration resulting in mild spinal
stenosis at L3-4 and L4-5.
2. Multilevel neural foraminal stenosis as above, moderate to severe
on the right at L5-S1.
3.  Aortic Atherosclerosis (CGTL7-ICR.R).

## 2018-12-31 DIAGNOSIS — M0579 Rheumatoid arthritis with rheumatoid factor of multiple sites without organ or systems involvement: Secondary | ICD-10-CM | POA: Diagnosis not present

## 2019-01-02 NOTE — Progress Notes (Signed)
Remote pacemaker transmission.   

## 2019-01-04 DIAGNOSIS — E119 Type 2 diabetes mellitus without complications: Secondary | ICD-10-CM | POA: Diagnosis not present

## 2019-01-04 DIAGNOSIS — M069 Rheumatoid arthritis, unspecified: Secondary | ICD-10-CM | POA: Diagnosis not present

## 2019-01-04 DIAGNOSIS — I10 Essential (primary) hypertension: Secondary | ICD-10-CM | POA: Diagnosis not present

## 2019-01-04 DIAGNOSIS — M545 Low back pain: Secondary | ICD-10-CM | POA: Diagnosis not present

## 2019-01-05 DIAGNOSIS — D508 Other iron deficiency anemias: Secondary | ICD-10-CM | POA: Diagnosis not present

## 2019-01-15 DIAGNOSIS — D508 Other iron deficiency anemias: Secondary | ICD-10-CM | POA: Diagnosis not present

## 2019-01-26 ENCOUNTER — Encounter (HOSPITAL_COMMUNITY): Payer: Medicare Other

## 2019-01-28 DIAGNOSIS — M0579 Rheumatoid arthritis with rheumatoid factor of multiple sites without organ or systems involvement: Secondary | ICD-10-CM | POA: Diagnosis not present

## 2019-02-12 ENCOUNTER — Other Ambulatory Visit (HOSPITAL_COMMUNITY): Payer: Self-pay | Admitting: *Deleted

## 2019-02-12 NOTE — Progress Notes (Signed)
10-06-18 (Cardiac Clearance from Cecilie Kicks, NP  09-18-18 [facility]) EKG

## 2019-02-12 NOTE — Patient Instructions (Signed)
YOU NEED TO HAVE A COVID 19 TEST ON_______ @_______ , THIS TEST MUST BE DONE BEFORE SURGERY, COME TO [place] ENTRANCE. ONCE YOUR COVID TEST IS COMPLETED, PLEASE BEGIN THE QUARANTINE INSTRUCTIONS AS OUTLINED IN YOUR HANDOUT.                Adam Watts  02/12/2019   Your procedure is scheduled on:  02-24-19    Report to [facility] Main  Entrance    Report to Admitting at 7:50 AM    Call this number if you have problems the morning of surgery [phone]    Remember: NO SOLID FOOD AFTER MIDNIGHT THE NIGHT PRIOR TO SURGERY. NOTHING BY MOUTH EXCEPT CLEAR LIQUIDS UNTIL 3 HOURS PRIOR TO [place] SURGERY. PLEASE FINISH ENSURE DRINK PER SURGEON ORDER 3 HOURS PRIOR TO SCHEDULED SURGERY TIME WHICH NEEDS TO BE COMPLETED AT 7:20 AM____________.    CLEAR LIQUID DIET   Foods Allowed                                                                     Foods Excluded  Coffee and tea, regular and decaf                             liquids that you cannot  Plain Jell-O any favor except red or purple                                           see through such as: Fruit ices (not with fruit pulp)                                     milk, soups, orange juice  Iced Popsicles                                    All solid food Carbonated beverages, regular and diet                                    Cranberry, grape and apple juices Sports drinks like Gatorade Lightly seasoned clear broth or consume(fat free) Sugar, honey syrup  Sample Menu Breakfast                                Lunch                                     Supper Cranberry juice                    Beef broth                            Chicken broth Jell-O  Grape juice                           Apple juice Coffee or tea                        Jell-O                                      Popsicle                                                Coffee or tea                         Coffee or tea  _____________________________________________________________________    Take these medicines the morning of surgery with A SIP OF WATER:  Diltiazem (Tiazac), Loratadine (Claritin), and Omeprazole (Prilosec)  BRUSH YOUR TEETH MORNING OF SURGERY AND RINSE YOUR MOUTH OUT, NO CHEWING GUM CANDY OR MINTS.                                You may not have any metal on your body including hair pins and              piercings     Do not wear jewelry, cologne, lotions, powders or deodorant                    Men may shave face and neck.   Do not bring valuables to the hospital. [place].  Contacts, dentures or bridgework may not be worn into surgery.     Patients discharged the day of surgery will not be allowed to drive home. IF YOU ARE HAVING SURGERY AND GOING HOME THE SAME DAY, YOU MUST HAVE AN ADULT TO DRIVE YOU HOME AND BE WITH YOU FOR 24 HOURS. YOU MAY GO HOME BY TAXI OR UBER OR ORTHERWISE, BUT AN ADULT MUST ACCOMPANY YOU HOME AND STAY WITH YOU FOR 24 HOURS.  Name and phone number of your driver:  Special Instructions: N/A              Please read over the following fact sheets you were given: _____________________________________________________________________  How to Manage Your Diabetes Before and After Surgery  Why is it important to control my blood sugar before and after surgery? . Improving blood sugar levels before and after surgery helps healing and can limit problems. . A way of improving blood sugar control is eating a healthy diet by: o  Eating less sugar and carbohydrates o  Increasing activity/exercise o  Talking with your doctor about reaching your blood sugar goals . High blood sugars (greater than 180 mg/dL) can raise your risk of infections and slow your recovery, so you will need to focus on controlling your diabetes during the weeks before surgery. . Make sure that the doctor who  takes care of your diabetes knows about your planned surgery including the date and location.  How do I manage my blood sugar before surgery? . Check  your blood sugar at least 4 times a day, starting 2 days before surgery, to make sure that the level is not too high or low. o Check your blood sugar the morning of your surgery when you wake up and every 2 hours until you get to the Short Stay unit. . If your blood sugar is less than 70 mg/dL, you will need to treat for low blood sugar: o Do not take insulin. o Treat a low blood sugar (less than 70 mg/dL) with  cup of clear juice (cranberry or apple), 4 glucose tablets, OR glucose gel. o Recheck blood sugar in 15 minutes after treatment (to make sure it is greater than 70 mg/dL). If your blood sugar is not greater than 70 mg/dL on recheck, call [phone] for further instructions. . Report your blood sugar to the short stay nurse when you get to Short Stay.  . If you are admitted to the hospital after surgery: o Your blood sugar will be checked by the staff and you will probably be given insulin after surgery (instead of oral diabetes medicines) to make sure you have good blood sugar levels. o The goal for blood sugar control after surgery is 80-180 mg/dL.   WHAT DO I DO ABOUT MY DIABETES MEDICATION?  Marland Kitchen Do not take oral diabetes medicines (pills) the morning of surgery.  . THE DAY BEFORE SURGERY, TAKE YOUR USUAL METFORMIN      DO NOT TAKE ANY METFORMIN THE DAY OF YOUR SURGERY       Reviewed and Endorsed by [facility] Patient Education Committee, August 2015           [facility] - Preparing for Surgery Before surgery, you can play an important role.  Because skin is not sterile, your skin needs to be as free of germs as possible.  You can reduce the number of germs on your skin by washing with CHG (chlorahexidine gluconate) soap before surgery.  CHG is an antiseptic cleaner which kills germs and bonds with the skin to continue  killing germs even after washing. Please DO NOT use if you have an allergy to CHG or antibacterial soaps.  If your skin becomes reddened/irritated stop using the CHG and inform your nurse when you arrive at Short Stay. Do not shave (including legs and underarms) for at least 48 hours prior to the first CHG shower.  You may shave your face/neck. Please follow these instructions carefully:  1.  Shower with CHG Soap the night before surgery and the  morning of Surgery.  2.  If you choose to wash your hair, wash your hair first as usual with your  normal  shampoo.  3.  After you shampoo, rinse your hair and body thoroughly to remove the  shampoo.                           4.  Use CHG as you would any other liquid soap.  You can apply chg directly  to the skin and wash                       Gently with a scrungie or clean washcloth.  5.  Apply the CHG Soap to your body ONLY FROM THE NECK DOWN.   Do not use on face/ open  Wound or open sores. Avoid contact with eyes, ears mouth and genitals (private parts).                       Wash face,  Genitals (private parts) with your normal soap.             6.  Wash thoroughly, paying special attention to the area where your surgery  will be performed.  7.  Thoroughly rinse your body with warm water from the neck down.  8.  DO NOT shower/wash with your normal soap after using and rinsing off  the CHG Soap.                9.  Pat yourself dry with a clean towel.            10.  Wear clean pajamas.            11.  Place clean sheets on your bed the night of your first shower and do not  sleep with pets. Day of Surgery : Do not apply any lotions/deodorants the morning of surgery.  Please wear clean clothes to the hospital/surgery center.  FAILURE TO FOLLOW THESE INSTRUCTIONS MAY RESULT IN THE CANCELLATION OF YOUR SURGERY PATIENT SIGNATURE_________________________________  NURSE  SIGNATURE__________________________________  ________________________________________________________________________   Adam Phenix  An incentive spirometer is a tool that can help keep your lungs clear and active. This tool measures how well you are filling your lungs with each breath. Taking long deep breaths may help reverse or decrease the chance of developing breathing (pulmonary) problems (especially infection) following:  A long period of time when you are unable to move or be active. BEFORE THE PROCEDURE   If the spirometer includes an indicator to show your best effort, your nurse or respiratory therapist will set it to a desired goal.  If possible, sit up straight or lean slightly forward. Try not to slouch.  Hold the incentive spirometer in an upright position. INSTRUCTIONS FOR USE  1. Sit on the edge of your bed if possible, or sit up as far as you can in bed or on a chair. 2. Hold the incentive spirometer in an upright position. 3. Breathe out normally. 4. Place the mouthpiece in your mouth and seal your lips tightly around it. 5. Breathe in slowly and as deeply as possible, raising the piston or the ball toward the top of the column. 6. Hold your breath for 3-5 seconds or for as long as possible. Allow the piston or ball to fall to the bottom of the column. 7. Remove the mouthpiece from your mouth and breathe out normally. 8. Rest for a few seconds and repeat Steps 1 through 7 at least 10 times every 1-2 hours when you are awake. Take your time and take a few normal breaths between deep breaths. 9. The spirometer may include an indicator to show your best effort. Use the indicator as a goal to work toward during each repetition. 10. After each set of 10 deep breaths, practice coughing to be sure your lungs are clear. If you have an incision (the cut made at the time of surgery), support your incision when coughing by placing a pillow or rolled up towels firmly  against it. Once you are able to get out of bed, walk around indoors and cough well. You may stop using the incentive spirometer when instructed by your caregiver.  RISKS AND COMPLICATIONS  Take your time so you do not  get dizzy or light-headed.  If you are in pain, you may need to take or ask for pain medication before doing incentive spirometry. It is harder to take a deep breath if you are having pain. AFTER USE  Rest and breathe slowly and easily.  It can be helpful to keep track of a log of your progress. Your caregiver can provide you with a simple table to help with this. If you are using the spirometer at home, follow these instructions: [place] IF:   You are having difficultly using the spirometer.  You have trouble using the spirometer as often as instructed.  Your pain medication is not giving enough relief while using the spirometer.  You develop fever of 100.5 F (38.1 C) or higher. SEEK IMMEDIATE MEDICAL CARE IF:   You cough up bloody sputum that had not been present before.  You develop fever of 102 F (38.9 C) or greater.  You develop worsening pain at or near the incision site. MAKE SURE YOU:   Understand these instructions.  Will watch your condition.  Will get help right away if you are not doing well or get worse. Document Released: 11/25/2006 Document Revised: 10/07/2011 Document Reviewed: 01/26/2007 [facility] Patient Information 2014 [place], [place].   ________________________________________________________________________

## 2019-02-15 ENCOUNTER — Encounter (HOSPITAL_COMMUNITY): Admission: RE | Admit: 2019-02-15 | Payer: Medicare Other | Source: Ambulatory Visit

## 2019-02-15 ENCOUNTER — Encounter (HOSPITAL_COMMUNITY): Payer: Self-pay

## 2019-02-15 ENCOUNTER — Other Ambulatory Visit: Payer: Self-pay

## 2019-02-15 ENCOUNTER — Encounter (HOSPITAL_COMMUNITY)
Admission: RE | Admit: 2019-02-15 | Discharge: 2019-02-15 | Disposition: A | Discharge status: Home / Self Care | Payer: Medicare Other | Source: Ambulatory Visit | Attending: Orthopaedic Surgery | Admitting: Orthopaedic Surgery

## 2019-02-15 DIAGNOSIS — S338XXA Sprain of other parts of lumbar spine and pelvis, initial encounter: Secondary | ICD-10-CM | POA: Diagnosis not present

## 2019-02-15 DIAGNOSIS — M25512 Pain in left shoulder: Secondary | ICD-10-CM | POA: Insufficient documentation

## 2019-02-15 DIAGNOSIS — Z01812 Encounter for preprocedural laboratory examination: Secondary | ICD-10-CM | POA: Diagnosis not present

## 2019-02-15 DIAGNOSIS — M47816 Spondylosis without myelopathy or radiculopathy, lumbar region: Secondary | ICD-10-CM | POA: Diagnosis not present

## 2019-02-15 DIAGNOSIS — M9903 Segmental and somatic dysfunction of lumbar region: Secondary | ICD-10-CM | POA: Diagnosis not present

## 2019-02-15 LAB — BASIC METABOLIC PANEL
Anion gap: 13 (ref 5–15)
BUN: 16 mg/dL (ref 8–23)
CO2: 26 mmol/L (ref 22–32)
Calcium: 9.9 mg/dL (ref 8.9–10.3)
Chloride: 102 mmol/L (ref 98–111)
Creatinine, Ser: 1.08 mg/dL (ref 0.61–1.24)
GFR calc Af Amer: >60 mL/min (ref >60)
GFR calc non Af Amer: >60 mL/min (ref >60)
Glucose, Bld: 116 mg/dL — ABNORMAL HIGH (ref 70–99)
Potassium: 3.8 mmol/L (ref 3.5–5.1)
Sodium: 141 mmol/L (ref 135–145)

## 2019-02-15 LAB — CBC
HCT: 53.7 % — ABNORMAL HIGH (ref 39.0–52.0)
Hemoglobin: 16.6 g/dL (ref 13.0–17.0)
MCH: 26.9 pg (ref 26.0–34.0)
MCHC: 30.9 g/dL (ref 30.0–36.0)
MCV: 87.2 fL (ref 80.0–100.0)
Platelets: 259 10*3/uL (ref 150–400)
RBC: 6.16 MIL/uL — ABNORMAL HIGH (ref 4.22–5.81)
RDW: 25.4 % — ABNORMAL HIGH (ref 11.5–15.5)
WBC: 9 10*3/uL (ref 4.0–10.5)
nRBC: 0 % (ref 0.0–0.2)

## 2019-02-15 LAB — SURGICAL PCR SCREEN
MRSA, PCR: NEGATIVE
Staphylococcus aureus: NEGATIVE

## 2019-02-15 LAB — HEMOGLOBIN A1C
Hgb A1c MFr Bld: 6.1 % — ABNORMAL HIGH (ref 4.8–5.6)
Mean Plasma Glucose: 128.37 mg/dL

## 2019-02-16 DIAGNOSIS — M47816 Spondylosis without myelopathy or radiculopathy, lumbar region: Secondary | ICD-10-CM | POA: Diagnosis not present

## 2019-02-16 DIAGNOSIS — S338XXA Sprain of other parts of lumbar spine and pelvis, initial encounter: Secondary | ICD-10-CM | POA: Diagnosis not present

## 2019-02-16 DIAGNOSIS — M9903 Segmental and somatic dysfunction of lumbar region: Secondary | ICD-10-CM | POA: Diagnosis not present

## 2019-02-18 DIAGNOSIS — M9903 Segmental and somatic dysfunction of lumbar region: Secondary | ICD-10-CM | POA: Diagnosis not present

## 2019-02-18 DIAGNOSIS — M47816 Spondylosis without myelopathy or radiculopathy, lumbar region: Secondary | ICD-10-CM | POA: Diagnosis not present

## 2019-02-18 DIAGNOSIS — S338XXA Sprain of other parts of lumbar spine and pelvis, initial encounter: Secondary | ICD-10-CM | POA: Diagnosis not present

## 2019-02-18 NOTE — Anesthesia Preprocedure Evaluation (Addendum)
Anesthesia Evaluation  Patient identified by MRN, date of birth, ID band Patient awake    Reviewed: Allergy & Precautions, H&P , NPO status , Patient's Chart, lab work & pertinent test results  Airway Mallampati: II   Neck ROM: full    Dental   Pulmonary Current Smoker,    breath sounds clear to auscultation       Cardiovascular hypertension, + dysrhythmias Atrial Fibrillation + pacemaker  Rhythm:irregular Rate:Normal     Neuro/Psych  Neuromuscular disease    GI/Hepatic GERD  ,  Endo/Other  diabetes  Renal/GU      Musculoskeletal  (+) Arthritis ,   Abdominal   Peds  Hematology   Anesthesia Other Findings   Reproductive/Obstetrics                            Anesthesia Physical Anesthesia Plan  ASA: III  Anesthesia Plan: General   Post-op Pain Management:  Regional for Post-op pain   Induction: Intravenous  PONV Risk Score and Plan: 1 and Ondansetron, Dexamethasone, Midazolam and Treatment may vary due to age or medical condition  Airway Management Planned: Oral ETT  Additional Equipment:   Intra-op Plan:   Post-operative Plan: Extubation in OR  Informed Consent: I have reviewed the patients History and Physical, chart, labs and discussed the procedure including the risks, benefits and alternatives for the proposed anesthesia with the patient or authorized representative who has indicated his/her understanding and acceptance.       Plan Discussed with: CRNA, Anesthesiologist and Surgeon  Anesthesia Plan Comments: (See PAT note 02/15/2019, Konrad Felix, PA-C)       Anesthesia Quick Evaluation

## 2019-02-18 NOTE — Progress Notes (Addendum)
Anesthesia Chart Review   Case: [id number] Date/Time: 02/24/19 1005   Procedure: REVERSE SHOULDER ARTHROPLASTY (Left )   Anesthesia type: Choice   Pre-op diagnosis: djd left shoulder   Location: [facility] ROOM 06 / [facility]   Surgeon: Hiram Gash, MD      DISCUSSION:68 y.o. current every day smoker with h/o A-fib (on Xarelto), GERD, HTN, pre-diabetes, CHB with pacemaker (device check 12/23/2018, left shoulder DJD scheduled for above procedure 02/24/2019 with Dr. Ophelia Charter.   Per Cecilie Kicks, NP with cardiology, "[name] was last seen on 09/18/18 by Dr. Lovena Le.  Since that day, CEASER EBELING has done well with normal pacemaker function.  He does have PAF.  He may hold his Xarelto for 2-3 days prior to procedure.  Then resume. Therefore, based on ACC/AHA guidelines, the patient would be at acceptable risk for the planned procedure without further cardiovascular testing."  Anticipate pt can proceed with planned procedure barring acute status change.   VS: BP (!) 154/94 (BP Location: Right Arm)   Pulse 85   Temp 36.7 C (Oral)   Resp 18   Ht 5\' 7"  (1.702 m)   Wt 79.6 kg   BMI 27.49 kg/m   PROVIDERS: Sinda Du, MD is PCP   Cristopher Peru, MD is Cardiologist  LABS: Labs reviewed: Acceptable for surgery. (all labs ordered are listed, but only abnormal results are displayed)  Labs Reviewed  BASIC METABOLIC PANEL - Abnormal; Notable for the following components:      Result Value   Glucose, Bld 116 (*)    All other components within normal limits  CBC - Abnormal; Notable for the following components:   RBC 6.16 (*)    HCT 53.7 (*)    RDW 25.4 (*)    All other components within normal limits  HEMOGLOBIN A1C - Abnormal; Notable for the following components:   Hgb A1c MFr Bld 6.1 (*)    All other components within normal limits  SURGICAL PCR SCREEN     IMAGES:   EKG: 09/18/2018 Rate 89 bpm AV dual-paced rhythm   CV: Stress Test 08/12/14 Hypertensive response to  exercise without chest pain.  Good exercise tolerance   Past Medical History:  Diagnosis Date  . A-fib ([place])   . Arthritis    RA all over  . Chest pain   . Chronic back pain   . Dysrhythmia    a-fib  . GERD (gastroesophageal reflux disease)   . Hypertension   . Lumbar radiculopathy   . Pacemaker    CHB, PAF  . Pneumonia   . Pre-diabetes   . Smoking     Past Surgical History:  Procedure Laterality Date  . CHOLECYSTECTOMY    . DENTAL SURGERY     implants  . INGUINAL HERNIA REPAIR Bilateral 04/24/2016   Procedure: LAPAROSCOPIC BILATERAL INGUINAL HERNIA REPAIR;  Surgeon: Coralie Keens, MD;  Location: [place];  Service: General;  Laterality: Bilateral;  . INSERTION OF MESH Bilateral 04/24/2016   Procedure: INSERTION OF MESH;  Surgeon: Coralie Keens, MD;  Location: [place];  Service: General;  Laterality: Bilateral;  . LUMBAR LAMINECTOMY/DECOMPRESSION MICRODISCECTOMY Left 04/10/2018   Procedure: Left Lumbar two-three Laminectomy/Foraminotomy;  Surgeon: Ashok Pall, MD;  Location: [place];  Service: Neurosurgery;  Laterality: Left;  . SHOULDER ARTHROSCOPY Right    x2  . VASECTOMY      MEDICATIONS: . CINNAMON PO  . Coenzyme Q10-Vitamin E (QUNOL ULTRA COQ10) 100-150 MG-UNIT CAPS  .  diltiazem (TIAZAC) 360 MG 24 hr capsule  . HYDROcodone-acetaminophen (NORCO/VICODIN) 5-325 MG tablet  . Lidocaine-Menthol (ICY HOT LIDOCAINE PLUS MENTHOL) 4-1 % CREA  . loratadine (CLARITIN) 10 MG tablet  . losartan-hydrochlorothiazide (HYZAAR) 100-12.5 MG per tablet  . metFORMIN (GLUCOPHAGE-XR) 500 MG 24 hr tablet  . Multiple Vitamins-Minerals (MEGA MULTIVITAMIN FOR MEN PO)  . omeprazole (PRILOSEC) 20 MG capsule  . traMADol (ULTRAM) 50 MG tablet  . vitamin B-12 (CYANOCOBALAMIN) 1000 MCG tablet  . XARELTO 20 MG TABS tablet   No current facility-administered medications for this encounter.    . ondansetron (ZOFRAN) 4 mg in sodium chloride 0.9 % 50 mL IVPB      Maia Plan [facility] Pre-Surgical Testing [phone] 02/18/19  3:24 PM

## 2019-02-19 DIAGNOSIS — M47816 Spondylosis without myelopathy or radiculopathy, lumbar region: Secondary | ICD-10-CM | POA: Diagnosis not present

## 2019-02-19 DIAGNOSIS — S338XXA Sprain of other parts of lumbar spine and pelvis, initial encounter: Secondary | ICD-10-CM | POA: Diagnosis not present

## 2019-02-19 DIAGNOSIS — M9903 Segmental and somatic dysfunction of lumbar region: Secondary | ICD-10-CM | POA: Diagnosis not present

## 2019-02-20 ENCOUNTER — Other Ambulatory Visit (HOSPITAL_COMMUNITY)
Admission: RE | Admit: 2019-02-20 | Discharge: 2019-02-20 | Disposition: A | Discharge status: Home / Self Care | Payer: Medicare Other | Source: Ambulatory Visit | Attending: Orthopaedic Surgery | Admitting: Orthopaedic Surgery

## 2019-02-20 DIAGNOSIS — Z1159 Encounter for screening for other viral diseases: Secondary | ICD-10-CM | POA: Diagnosis not present

## 2019-02-20 LAB — SARS CORONAVIRUS 2 (TAT 6-24 HRS): SARS Coronavirus 2: NEGATIVE

## 2019-02-24 ENCOUNTER — Inpatient Hospital Stay (HOSPITAL_COMMUNITY): Payer: Medicare Other | Admitting: Certified Registered Nurse Anesthetist

## 2019-02-24 ENCOUNTER — Encounter (HOSPITAL_COMMUNITY): Payer: Self-pay | Admitting: Emergency Medicine

## 2019-02-24 ENCOUNTER — Other Ambulatory Visit: Payer: Self-pay

## 2019-02-24 ENCOUNTER — Inpatient Hospital Stay (HOSPITAL_COMMUNITY): Payer: Medicare Other

## 2019-02-24 ENCOUNTER — Encounter (HOSPITAL_COMMUNITY)
Admission: RE | Disposition: A | Discharge status: Home / Self Care | Payer: Self-pay | Source: Home / Self Care | Attending: Orthopaedic Surgery

## 2019-02-24 ENCOUNTER — Inpatient Hospital Stay (HOSPITAL_COMMUNITY)
Admission: RE | Admit: 2019-02-24 | Discharge: 2019-02-25 | DRG: 483 | Disposition: A | Discharge status: Home / Self Care | Payer: Medicare Other | Attending: Orthopaedic Surgery | Admitting: Orthopaedic Surgery

## 2019-02-24 ENCOUNTER — Inpatient Hospital Stay (HOSPITAL_COMMUNITY): Payer: Medicare Other | Admitting: Physician Assistant

## 2019-02-24 DIAGNOSIS — M12812 Other specific arthropathies, not elsewhere classified, left shoulder: Secondary | ICD-10-CM | POA: Diagnosis present

## 2019-02-24 DIAGNOSIS — G8929 Other chronic pain: Secondary | ICD-10-CM | POA: Diagnosis present

## 2019-02-24 DIAGNOSIS — I10 Essential (primary) hypertension: Secondary | ICD-10-CM | POA: Diagnosis present

## 2019-02-24 DIAGNOSIS — Z7901 Long term (current) use of anticoagulants: Secondary | ICD-10-CM

## 2019-02-24 DIAGNOSIS — Z09 Encounter for follow-up examination after completed treatment for conditions other than malignant neoplasm: Secondary | ICD-10-CM

## 2019-02-24 DIAGNOSIS — M19012 Primary osteoarthritis, left shoulder: Secondary | ICD-10-CM | POA: Diagnosis not present

## 2019-02-24 DIAGNOSIS — Z7984 Long term (current) use of oral hypoglycemic drugs: Secondary | ICD-10-CM | POA: Diagnosis not present

## 2019-02-24 DIAGNOSIS — Z79899 Other long term (current) drug therapy: Secondary | ICD-10-CM | POA: Diagnosis not present

## 2019-02-24 DIAGNOSIS — R7303 Prediabetes: Secondary | ICD-10-CM | POA: Diagnosis not present

## 2019-02-24 DIAGNOSIS — Z9049 Acquired absence of other specified parts of digestive tract: Secondary | ICD-10-CM | POA: Diagnosis not present

## 2019-02-24 DIAGNOSIS — Z471 Aftercare following joint replacement surgery: Secondary | ICD-10-CM | POA: Diagnosis not present

## 2019-02-24 DIAGNOSIS — F17210 Nicotine dependence, cigarettes, uncomplicated: Secondary | ICD-10-CM | POA: Diagnosis not present

## 2019-02-24 DIAGNOSIS — M75102 Unspecified rotator cuff tear or rupture of left shoulder, not specified as traumatic: Secondary | ICD-10-CM | POA: Diagnosis present

## 2019-02-24 DIAGNOSIS — I4891 Unspecified atrial fibrillation: Secondary | ICD-10-CM | POA: Diagnosis not present

## 2019-02-24 DIAGNOSIS — I480 Paroxysmal atrial fibrillation: Secondary | ICD-10-CM | POA: Diagnosis present

## 2019-02-24 DIAGNOSIS — Z950 Presence of cardiac pacemaker: Secondary | ICD-10-CM

## 2019-02-24 DIAGNOSIS — Z20828 Contact with and (suspected) exposure to other viral communicable diseases: Secondary | ICD-10-CM | POA: Diagnosis not present

## 2019-02-24 DIAGNOSIS — K219 Gastro-esophageal reflux disease without esophagitis: Secondary | ICD-10-CM | POA: Diagnosis not present

## 2019-02-24 DIAGNOSIS — G8918 Other acute postprocedural pain: Secondary | ICD-10-CM | POA: Diagnosis not present

## 2019-02-24 DIAGNOSIS — Z96612 Presence of left artificial shoulder joint: Secondary | ICD-10-CM | POA: Diagnosis not present

## 2019-02-24 HISTORY — PX: REVERSE SHOULDER ARTHROPLASTY: SHX5054

## 2019-02-24 LAB — GLUCOSE, CAPILLARY
Glucose-Capillary: 150 mg/dL — ABNORMAL HIGH (ref 70–99)
Glucose-Capillary: 155 mg/dL — ABNORMAL HIGH (ref 70–99)

## 2019-02-24 SURGERY — ARTHROPLASTY, SHOULDER, TOTAL, REVERSE
Anesthesia: General | Site: Shoulder | Laterality: Left

## 2019-02-24 MED ORDER — CEFAZOLIN SODIUM-DEXTROSE 2-4 GM/100ML-% IV SOLN: 2.0000 g | INTRAVENOUS | Status: DC

## 2019-02-24 MED ORDER — LIDOCAINE 2% (20 MG/ML) 5 ML SYRINGE
INTRAMUSCULAR | Status: DC | PRN
  Administered 2019-02-24: 60 mg via INTRAVENOUS

## 2019-02-24 MED ORDER — BUPIVACAINE LIPOSOME 1.3 % IJ SUSP
INTRAMUSCULAR | Status: DC | PRN
  Administered 2019-02-24: 10 mL via PERINEURAL

## 2019-02-24 MED ORDER — SODIUM CHLORIDE 0.9 % IR SOLN
Status: DC | PRN
  Administered 2019-02-24: 1000 mL

## 2019-02-24 MED ORDER — ROCURONIUM BROMIDE 50 MG/5ML IV SOSY
PREFILLED_SYRINGE | INTRAVENOUS | Status: DC | PRN
  Administered 2019-02-24: 10 mg via INTRAVENOUS
  Administered 2019-02-24: 50 mg via INTRAVENOUS

## 2019-02-24 MED ORDER — CEFAZOLIN SODIUM-DEXTROSE 1-4 GM/50ML-% IV SOLN
1.0000 g | Freq: Four times a day (QID) | INTRAVENOUS | Status: AC
  Administered 2019-02-24 – 2019-02-25 (×3): 1 g via INTRAVENOUS
  Filled 2019-02-24 (×4): qty 50

## 2019-02-24 MED ORDER — LIDOCAINE 2% (20 MG/ML) 5 ML SYRINGE
INTRAMUSCULAR | Status: AC
  Filled 2019-02-24: qty 5

## 2019-02-24 MED ORDER — VANCOMYCIN HCL 1000 MG IV SOLR
INTRAVENOUS | Status: AC
  Filled 2019-02-24: qty 1000

## 2019-02-24 MED ORDER — LOSARTAN POTASSIUM-HCTZ 100-12.5 MG PO TABS: 1.0000 | ORAL_TABLET | Freq: Every day | ORAL | Status: DC

## 2019-02-24 MED ORDER — CEFAZOLIN SODIUM-DEXTROSE 2-4 GM/100ML-% IV SOLN
2.0000 g | INTRAVENOUS | Status: AC
  Administered 2019-02-24: 10:00:00 2 g via INTRAVENOUS
  Filled 2019-02-24: qty 100

## 2019-02-24 MED ORDER — VANCOMYCIN HCL 1 G IV SOLR
INTRAVENOUS | Status: DC | PRN
  Administered 2019-02-24: 1000 mg via TOPICAL

## 2019-02-24 MED ORDER — FENTANYL CITRATE (PF) 100 MCG/2ML IJ SOLN
25.0000 ug | INTRAMUSCULAR | Status: DC | PRN
  Administered 2019-02-24: 30 ug via INTRAVENOUS
  Administered 2019-02-24: 50 ug via INTRAVENOUS

## 2019-02-24 MED ORDER — OXYCODONE HCL 5 MG PO TABS: 5.0000 mg | ORAL_TABLET | Freq: Once | ORAL | Status: DC | PRN

## 2019-02-24 MED ORDER — SUGAMMADEX SODIUM 200 MG/2ML IV SOLN
INTRAVENOUS | Status: DC | PRN
  Administered 2019-02-24: 200 mg via INTRAVENOUS

## 2019-02-24 MED ORDER — SUCCINYLCHOLINE CHLORIDE 200 MG/10ML IV SOSY
PREFILLED_SYRINGE | INTRAVENOUS | Status: DC | PRN
  Administered 2019-02-24: 100 mg via INTRAVENOUS

## 2019-02-24 MED ORDER — ROCURONIUM BROMIDE 10 MG/ML (PF) SYRINGE
PREFILLED_SYRINGE | INTRAVENOUS | Status: AC
  Filled 2019-02-24: qty 10

## 2019-02-24 MED ORDER — ONDANSETRON HCL 4 MG/2ML IJ SOLN
INTRAMUSCULAR | Status: AC
  Filled 2019-02-24: qty 2

## 2019-02-24 MED ORDER — LOSARTAN POTASSIUM 50 MG PO TABS
100.0000 mg | ORAL_TABLET | Freq: Every day | ORAL | Status: DC
  Administered 2019-02-24 – 2019-02-25 (×2): 100 mg via ORAL
  Filled 2019-02-24 (×2): qty 2

## 2019-02-24 MED ORDER — BUPIVACAINE HCL (PF) 0.5 % IJ SOLN
INTRAMUSCULAR | Status: DC | PRN
  Administered 2019-02-24: 15 mL via PERINEURAL

## 2019-02-24 MED ORDER — PROPOFOL 10 MG/ML IV BOLUS
INTRAVENOUS | Status: DC | PRN
  Administered 2019-02-24: 120 mg via INTRAVENOUS

## 2019-02-24 MED ORDER — HYDROMORPHONE HCL 1 MG/ML IJ SOLN: 0.5000 mg | INTRAMUSCULAR | Status: DC | PRN

## 2019-02-24 MED ORDER — TRANEXAMIC ACID-NACL 1000-0.7 MG/100ML-% IV SOLN
1000.0000 mg | INTRAVENOUS | Status: AC
  Administered 2019-02-24: 10:00:00 1000 mg via INTRAVENOUS
  Filled 2019-02-24: qty 100

## 2019-02-24 MED ORDER — SUCCINYLCHOLINE CHLORIDE 200 MG/10ML IV SOSY
PREFILLED_SYRINGE | INTRAVENOUS | Status: AC
  Filled 2019-02-24: qty 10

## 2019-02-24 MED ORDER — MIDAZOLAM HCL 2 MG/2ML IJ SOLN
1.0000 mg | INTRAMUSCULAR | Status: AC
  Administered 2019-02-24: 1 mg via INTRAVENOUS
  Filled 2019-02-24: qty 2

## 2019-02-24 MED ORDER — DEXAMETHASONE SODIUM PHOSPHATE 10 MG/ML IJ SOLN
INTRAMUSCULAR | Status: AC
  Filled 2019-02-24: qty 1

## 2019-02-24 MED ORDER — OXYCODONE HCL 5 MG/5ML PO SOLN: 5.0000 mg | Freq: Once | ORAL | Status: DC | PRN

## 2019-02-24 MED ORDER — FENTANYL CITRATE (PF) 100 MCG/2ML IJ SOLN
50.0000 ug | INTRAMUSCULAR | Status: AC
  Administered 2019-02-24: 50 ug via INTRAVENOUS
  Filled 2019-02-24: qty 2

## 2019-02-24 MED ORDER — CHLORHEXIDINE GLUCONATE 4 % EX LIQD: 60.0000 mL | Freq: Once | CUTANEOUS | Status: DC

## 2019-02-24 MED ORDER — DIPHENHYDRAMINE HCL 12.5 MG/5ML PO ELIX: 12.5000 mg | ORAL_SOLUTION | ORAL | Status: DC | PRN

## 2019-02-24 MED ORDER — TRANEXAMIC ACID-NACL 1000-0.7 MG/100ML-% IV SOLN: 1000.0000 mg | INTRAVENOUS | Status: DC

## 2019-02-24 MED ORDER — METOCLOPRAMIDE HCL 5 MG/ML IJ SOLN: 5.0000 mg | Freq: Three times a day (TID) | INTRAMUSCULAR | Status: DC | PRN

## 2019-02-24 MED ORDER — METOCLOPRAMIDE HCL 5 MG PO TABS: 5.0000 mg | ORAL_TABLET | Freq: Three times a day (TID) | ORAL | Status: DC | PRN

## 2019-02-24 MED ORDER — 0.9 % SODIUM CHLORIDE (POUR BTL) OPTIME
TOPICAL | Status: DC | PRN
  Administered 2019-02-24: 11:00:00 1000 mL

## 2019-02-24 MED ORDER — ONDANSETRON HCL 4 MG PO TABS: 4.0000 mg | ORAL_TABLET | Freq: Four times a day (QID) | ORAL | Status: DC | PRN

## 2019-02-24 MED ORDER — PROPOFOL 10 MG/ML IV BOLUS
INTRAVENOUS | Status: AC
  Filled 2019-02-24: qty 20

## 2019-02-24 MED ORDER — SODIUM CHLORIDE 0.9 % IV SOLN
INTRAVENOUS | Status: DC | PRN
  Administered 2019-02-24: 10:00:00 35 ug/min via INTRAVENOUS

## 2019-02-24 MED ORDER — FENTANYL CITRATE (PF) 100 MCG/2ML IJ SOLN
INTRAMUSCULAR | Status: AC
  Filled 2019-02-24: qty 2

## 2019-02-24 MED ORDER — HYDROCHLOROTHIAZIDE 12.5 MG PO CAPS
12.5000 mg | ORAL_CAPSULE | Freq: Every day | ORAL | Status: DC
  Administered 2019-02-24 – 2019-02-25 (×2): 12.5 mg via ORAL
  Filled 2019-02-24 (×3): qty 1

## 2019-02-24 MED ORDER — DOCUSATE SODIUM 100 MG PO CAPS
100.0000 mg | ORAL_CAPSULE | Freq: Two times a day (BID) | ORAL | Status: DC
  Administered 2019-02-24 – 2019-02-25 (×2): 100 mg via ORAL
  Filled 2019-02-24 (×2): qty 1

## 2019-02-24 MED ORDER — DEXAMETHASONE SODIUM PHOSPHATE 10 MG/ML IJ SOLN
INTRAMUSCULAR | Status: DC | PRN
  Administered 2019-02-24: 10 mg via INTRAVENOUS

## 2019-02-24 MED ORDER — RIVAROXABAN 10 MG PO TABS: 20.0000 mg | ORAL_TABLET | Freq: Every day | ORAL | Status: DC

## 2019-02-24 MED ORDER — ACETAMINOPHEN 500 MG PO TABS
1000.0000 mg | ORAL_TABLET | Freq: Three times a day (TID) | ORAL | Status: DC
  Administered 2019-02-24 – 2019-02-25 (×3): 1000 mg via ORAL
  Filled 2019-02-24 (×4): qty 2

## 2019-02-24 MED ORDER — METFORMIN HCL ER 500 MG PO TB24
500.0000 mg | ORAL_TABLET | Freq: Two times a day (BID) | ORAL | Status: DC
  Administered 2019-02-24 – 2019-02-25 (×2): 500 mg via ORAL
  Filled 2019-02-24 (×3): qty 1

## 2019-02-24 MED ORDER — CELECOXIB 200 MG PO CAPS
200.0000 mg | ORAL_CAPSULE | Freq: Two times a day (BID) | ORAL | Status: DC
  Administered 2019-02-24 – 2019-02-25 (×3): 200 mg via ORAL
  Filled 2019-02-24 (×3): qty 1

## 2019-02-24 MED ORDER — DILTIAZEM HCL ER COATED BEADS 240 MG PO CP24
360.0000 mg | ORAL_CAPSULE | Freq: Every day | ORAL | Status: DC
  Administered 2019-02-24 – 2019-02-25 (×2): 360 mg via ORAL
  Filled 2019-02-24 (×2): qty 1

## 2019-02-24 MED ORDER — LACTATED RINGERS IV SOLN
INTRAVENOUS | Status: DC
  Administered 2019-02-24: 08:00:00 via INTRAVENOUS

## 2019-02-24 MED ORDER — ONDANSETRON HCL 4 MG/2ML IJ SOLN: 4.0000 mg | Freq: Four times a day (QID) | INTRAMUSCULAR | Status: DC | PRN

## 2019-02-24 MED ORDER — FENTANYL CITRATE (PF) 100 MCG/2ML IJ SOLN
INTRAMUSCULAR | Status: DC | PRN
  Administered 2019-02-24: 50 ug via INTRAVENOUS
  Administered 2019-02-24 (×2): 25 ug via INTRAVENOUS

## 2019-02-24 MED ORDER — ZOLPIDEM TARTRATE 5 MG PO TABS
5.0000 mg | ORAL_TABLET | Freq: Every evening | ORAL | Status: DC | PRN
  Administered 2019-02-24: 5 mg via ORAL
  Filled 2019-02-24: qty 1

## 2019-02-24 MED ORDER — ONDANSETRON HCL 4 MG/2ML IJ SOLN
INTRAMUSCULAR | Status: DC | PRN
  Administered 2019-02-24: 4 mg via INTRAVENOUS

## 2019-02-24 MED ORDER — OXYCODONE HCL 5 MG PO TABS
5.0000 mg | ORAL_TABLET | ORAL | Status: DC | PRN
  Administered 2019-02-24: 10 mg via ORAL
  Administered 2019-02-25: 5 mg via ORAL
  Filled 2019-02-24: qty 1
  Filled 2019-02-24 (×2): qty 2

## 2019-02-24 SURGICAL SUPPLY — 60 items
BASEPLATE REV SHOULDER 29 OD (Plate) ×2 IMPLANT
BIT DRILL 3.2 PERIPHERAL SCREW (BIT) ×2 IMPLANT
BLADE EXTENDED COATED 6.5IN (ELECTRODE) IMPLANT
BLADE SAW SAG 73X25 THK (BLADE) ×1
BLADE SAW SGTL 73X25 THK (BLADE) ×1 IMPLANT
CHLORAPREP W/TINT 26 (MISCELLANEOUS) ×4 IMPLANT
CLSR STERI-STRIP ANTIMIC 1/2X4 (GAUZE/BANDAGES/DRESSINGS) ×2 IMPLANT
COOLER ICEMAN CLASSIC (MISCELLANEOUS) ×2 IMPLANT
COVER SURGICAL LIGHT HANDLE (MISCELLANEOUS) ×2 IMPLANT
COVER WAND RF STERILE (DRAPES) ×2 IMPLANT
DRAPE INCISE IOBAN 66X45 STRL (DRAPES) ×2 IMPLANT
DRAPE ORTHO SPLIT 77X108 STRL (DRAPES) ×2
DRAPE SHEET LG 3/4 BI-LAMINATE (DRAPES) ×2 IMPLANT
DRAPE SURG ORHT 6 SPLT 77X108 (DRAPES) ×2 IMPLANT
DRSG AQUACEL AG ADV 3.5X 6 (GAUZE/BANDAGES/DRESSINGS) ×2 IMPLANT
ELECT BLADE TIP CTD 4 INCH (ELECTRODE) ×2 IMPLANT
ELECT REM PT RETURN 15FT ADLT (MISCELLANEOUS) ×2 IMPLANT
GLENOSPHERE STANDARD 39 (Joint) ×2 IMPLANT
GLENOSPHERE STD 39 (Joint) ×1 IMPLANT
GLOVE BIO SURGEON STRL SZ8 (GLOVE) ×2 IMPLANT
GLOVE BIOGEL PI IND STRL 8 (GLOVE) ×2 IMPLANT
GLOVE BIOGEL PI INDICATOR 8 (GLOVE) ×2
GLOVE ECLIPSE 8.0 STRL XLNG CF (GLOVE) ×4 IMPLANT
GOWN SPEC L3 XXLG W/TWL (GOWN DISPOSABLE) ×2 IMPLANT
GOWN STRL REUS W/ TWL XL LVL3 (GOWN DISPOSABLE) ×1 IMPLANT
GOWN STRL REUS W/TWL XL LVL3 (GOWN DISPOSABLE) ×1
GUIDEWIRE GLENOID 2.5X220 (WIRE) ×2 IMPLANT
HANDPIECE INTERPULSE COAX TIP (DISPOSABLE) ×1
HEMOSTAT SURGICEL 2X14 (HEMOSTASIS) IMPLANT
HUMERAL STEM AEQUALIS 3BX74MM (Stem) ×2 IMPLANT
IMPL REVERSE SHOULDER 0X3.5 (Shoulder) ×1 IMPLANT
IMPLANT REVERSE SHOULDER 0X3.5 (Shoulder) ×2 IMPLANT
INSERT REV KIT SHOULDER 6X39 (Screw) ×2 IMPLANT
KIT BASIN OR (CUSTOM PROCEDURE TRAY) ×2 IMPLANT
KIT STABILIZATION SHOULDER (MISCELLANEOUS) ×2 IMPLANT
KIT TURNOVER KIT A (KITS) IMPLANT
MANIFOLD NEPTUNE II (INSTRUMENTS) ×2 IMPLANT
NEEDLE HYPO 25X1 1.5 SAFETY (NEEDLE) IMPLANT
NEEDLE MAYO CATGUT SZ4 (NEEDLE) IMPLANT
PACK SHOULDER (CUSTOM PROCEDURE TRAY) ×2 IMPLANT
PAD COLD SHLDR WRAP-ON (PAD) ×2 IMPLANT
RESTRAINT HEAD UNIVERSAL NS (MISCELLANEOUS) ×2 IMPLANT
SCREW 5.0X38 SMALL F/PERFORM (Screw) ×2 IMPLANT
SCREW BONE 6.5X40 SM (Screw) ×2 IMPLANT
SCREW PERIPHERAL 5.0X34 (Screw) ×2 IMPLANT
SET HNDPC FAN SPRY TIP SCT (DISPOSABLE) ×1 IMPLANT
SLING ULTRA III MED (ORTHOPEDIC SUPPLIES) ×2 IMPLANT
SPONGE LAP 18X18 X RAY DECT (DISPOSABLE) IMPLANT
STEM HUMERAL AEQUALIS 3BX74MM (Stem) ×1 IMPLANT
SUCTION FRAZIER HANDLE 12FR (TUBING) ×1
SUCTION TUBE FRAZIER 12FR DISP (TUBING) ×1 IMPLANT
SUT ETHIBOND 2 V 37 (SUTURE) ×2 IMPLANT
SUT ETHIBOND NAB CT1 #1 30IN (SUTURE) ×2 IMPLANT
SUT FIBERWIRE #5 38 CONV NDL (SUTURE) ×8
SUT MON AB 3-0 SH 27 (SUTURE) ×1
SUT MON AB 3-0 SH27 (SUTURE) ×1 IMPLANT
SUT VIC AB 0 CT1 36 (SUTURE) ×2 IMPLANT
SUTURE FIBERWR #5 38 CONV NDL (SUTURE) ×4 IMPLANT
TOWEL OR 17X26 10 PK STRL BLUE (TOWEL DISPOSABLE) ×2 IMPLANT
WATER STERILE IRR 1000ML POUR (IV SOLUTION) ×4 IMPLANT

## 2019-02-24 NOTE — Progress Notes (Signed)
Adam Watts, [employer] rep called and stated she would be here in 15 minutes.

## 2019-02-24 NOTE — Progress Notes (Signed)
Report given to Georgena Spurling, RN to monitor patient post block.

## 2019-02-24 NOTE — Anesthesia Procedure Notes (Signed)
Anesthesia Regional Block: Interscalene brachial plexus block   Pre-Anesthetic Checklist: ,, timeout performed, Correct Patient, Correct Site, Correct Laterality, Correct Procedure, Correct Position, site marked, Risks and benefits discussed,  Surgical consent,  Pre-op evaluation,  At surgeon's request and post-op pain management  Laterality: Left  Prep: chloraprep       Needles:  Injection technique: Single-shot  Needle Type: Echogenic Stimulator Needle     Needle Length: 5cm  Needle Gauge: 22     Additional Needles:   Procedures:, nerve stimulator,,,,,,,   Nerve Stimulator or Paresthesia:  Response: biceps flexion, 0.45 mA,   Additional Responses:   Narrative:  Start time: 02/24/2019 9:17 AM End time: 02/24/2019 9:22 AM Injection made incrementally with aspirations every 5 mL.  Performed by: Personally  Anesthesiologist: Albertha Ghee, MD  Additional Notes: Functioning IV was confirmed and monitors were applied.  A 6mm 22ga Arrow echogenic stimulator needle was used. Sterile prep and drape,hand hygiene and sterile gloves were used.  Negative aspiration and negative test dose prior to incremental administration of local anesthetic. The patient tolerated the procedure well.  Ultrasound guidance: relevent anatomy identified, needle position confirmed, local anesthetic spread visualized around nerve(s), vascular puncture avoided.  Image printed for medical record.

## 2019-02-24 NOTE — H&P (Signed)
PREOPERATIVE H&P  Chief Complaint: djd left shoulder  HPI: Adam Watts is a 68 y.o. male who presents for preoperative history and physical with a diagnosis of djd left shoulder. Symptoms are rated as moderate to severe, and have been worsening.  This is significantly impairing activities of daily living.  Please see my clinic note for full details on this patient's care.  He has elected for surgical management.   Past Medical History:  Diagnosis Date  . A-fib ([place])   . Arthritis    RA all over  . Chest pain   . Chronic back pain   . Dysrhythmia    a-fib  . GERD (gastroesophageal reflux disease)   . Hypertension   . Lumbar radiculopathy   . Pacemaker    CHB, PAF  . Pneumonia   . Pre-diabetes   . Smoking    Past Surgical History:  Procedure Laterality Date  . CHOLECYSTECTOMY    . DENTAL SURGERY     implants  . INGUINAL HERNIA REPAIR Bilateral 04/24/2016   Procedure: LAPAROSCOPIC BILATERAL INGUINAL HERNIA REPAIR;  Surgeon: Coralie Keens, MD;  Location: [place];  Service: General;  Laterality: Bilateral;  . INSERTION OF MESH Bilateral 04/24/2016   Procedure: INSERTION OF MESH;  Surgeon: Coralie Keens, MD;  Location: [place];  Service: General;  Laterality: Bilateral;  . LUMBAR LAMINECTOMY/DECOMPRESSION MICRODISCECTOMY Left 04/10/2018   Procedure: Left Lumbar two-three Laminectomy/Foraminotomy;  Surgeon: Ashok Pall, MD;  Location: [place];  Service: Neurosurgery;  Laterality: Left;  . SHOULDER ARTHROSCOPY Right    x2  . VASECTOMY     Social History   Socioeconomic History  . Marital status: Married    Spouse name: Not on file  . Number of children: Not on file  . Years of education: Not on file  . Highest education level: Not on file  Occupational History  . Not on file  Social Needs  . Financial resource strain: Not on file  . Food insecurity    Worry: Not on file    Inability: Not on file  . Transportation needs     Medical: Not on file    Non-medical: Not on file  Tobacco Use  . Smoking status: Current Every Day Smoker    Packs/day: 0.50  . Smokeless tobacco: Never Used  Substance and Sexual Activity  . Alcohol use: Yes    Comment: social  . Drug use: No  . Sexual activity: Not on file  Lifestyle  . Physical activity    Days per week: Not on file    Minutes per session: Not on file  . Stress: Not on file  Relationships  . Social Herbalist on phone: Not on file    Gets together: Not on file    Attends religious service: Not on file    Active member of club or organization: Not on file    Attends meetings of clubs or organizations: Not on file    Relationship status: Not on file  Other Topics Concern  . Not on file  Social History Narrative  . Not on file   History reviewed. No pertinent family history. No Known Allergies Prior to Admission medications   Medication Sig Start Date End Date Taking? Authorizing Provider  CINNAMON PO Take 2,000 mg by mouth daily.    Yes [provider]  Coenzyme Q10-Vitamin E (QUNOL ULTRA COQ10) 100-150 MG-UNIT CAPS Take 1 capsule by mouth daily.   Yes [provider]  diltiazem (TIAZAC) 360 MG 24 hr capsule TAKE ONE CAPSULE BY MOUTH EVERY DAY Patient taking differently: Take 360 mg by mouth daily.  08/17/18  Yes Evans Lance, MD  Lidocaine-Menthol (ICY HOT LIDOCAINE PLUS MENTHOL) 4-1 % CREA Apply 1 application topically daily as needed (pain).   Yes [provider]  loratadine (CLARITIN) 10 MG tablet Take 10 mg by mouth daily.   Yes [provider]  losartan-hydrochlorothiazide (HYZAAR) 100-12.5 MG per tablet Take 1 tablet by mouth daily.   Yes [provider]  metFORMIN (GLUCOPHAGE-XR) 500 MG 24 hr tablet Take 500 mg by mouth 2 (two) times daily with a meal. 11/28/17  Yes [provider]  Multiple Vitamins-Minerals (MEGA MULTIVITAMIN FOR MEN PO) Take 1 tablet by mouth daily.    Yes [provider]  omeprazole (PRILOSEC) 20 MG capsule Take 20 mg by mouth daily.   Yes [provider]  traMADol (ULTRAM) 50 MG tablet Take 50 mg by mouth every 6 (six) hours as needed.   Yes [provider]  vitamin B-12 (CYANOCOBALAMIN) 1000 MCG tablet Take 1,000 mcg by mouth daily.   Yes [provider]  XARELTO 20 MG TABS tablet TAKE 1 TABLET BY MOUTH DAILY WITH SUPPER Patient taking differently: Take 20 mg by mouth daily with supper.  11/13/18  Yes Evans Lance, MD  HYDROcodone-acetaminophen (NORCO/VICODIN) 5-325 MG tablet Take one tab po q 4 hrs prn pain Patient not taking: Reported on 02/09/2019 01/12/18   Triplett, Tammy, PA-C     Positive ROS: All other systems have been reviewed and were otherwise negative with the exception of those mentioned in the HPI and as above.  Physical Exam: General: Alert, no acute distress Cardiovascular: No pedal edema Respiratory: No cyanosis, no use of accessory musculature GI: No organomegaly, abdomen is soft and non-tender Skin: No lesions in the area of chief complaint Neurologic: Sensation intact distally Psychiatric: Patient is competent for consent with normal mood and affect Lymphatic: No axillary or cervical lymphadenopathy  MUSCULOSKELETAL: Left shoulder: pain with ROM, wwp arm  Assessment: djd left shoulder  Plan: Plan for Procedure(s): REVERSE SHOULDER ARTHROPLASTY  The risks benefits and alternatives were discussed with the patient including but not limited to the risks of nonoperative treatment, versus surgical intervention including infection, bleeding, nerve injury,  blood clots, cardiopulmonary complications, morbidity, mortality, among others, and they were willing to proceed.   We additionally specifically discussed risks of axillary nerve injury, infection, periprosthetic fracture, continued pain and longevity of implants prior to beginning procedure.     Hiram Gash, MD  02/24/2019 8:49  AM

## 2019-02-24 NOTE — Progress Notes (Signed)
Assisted Dr. Hodierne with left, ultrasound guided, interscalene  block. Side rails up, monitors on throughout procedure. See vital signs in flow sheet. Tolerated Procedure well. 

## 2019-02-24 NOTE — Op Note (Signed)
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  Procedure went well.  Good stability of the implants.  Patient had an irreparable cuff tear with minimal subscapularis tissue still left.  His teres minor was still intact though did not look normal.  Supra and infraspinatus were completely torn.  Good fixation.  Implants: Tornier ascend flex short 3 stem, 0 high offset tray, 6 poly-, 29 standard baseplate with a 39 mm glenosphere.  Post-operative plan: The patient will be NWB in sling.  The patient will be admitted overnight.  DVT prophylaxis not indicated in isolated upper extremity surgery patient with no specific risks factors.  Pain control with PRN pain medication preferring oral medicines.  Follow up plan will be scheduled in approximately 7 days for incision check and XR.  Physical therapy to start after first visit.  Post-Op Diagnosis: Same Surgeons:Primary: Hiram Gash, MD Assistants:Brandon Lynnell Jude Location: [facility] ROOM 06 Anesthesia: General Antibiotics: Ancef 2g preop, Vancomycin '1000mg'$  locally Tourniquet time: None Estimated Blood Loss: 009 Complications: None Specimens: None Implants: Implant Name Type Inv. Item Serial No. Manufacturer Lot No. LRB No. Used Action  BASEPLATE REV SHOULDER 38HW OD - [id number] Plate BASEPLATE REV SHOULDER 78LF OD  [employer] [id number] Left 1 Implanted  SCREW BONE 6.5X40 SM - [id number] Screw SCREW BONE 6.5X40 SM  [employer]  Left 1 Implanted  SCREW 5.0X38 SMALL F/PERFORM - [id number] Screw SCREW 5.0X38 SMALL F/PERFORM  [employer]  Left 1 Implanted  SCREW PERIPHERAL 5.0X34 - [id number] Screw SCREW PERIPHERAL 5.0X34  [employer]  Left 1 Implanted  GLENOSPHERE STANDARD 39 - [id number] Joint GLENOSPHERE  STANDARD 39  [employer] [id number] Left 1 Implanted  HUMERAL STEM AEQUALIS 3BX74MM - [id number] Stem HUMERAL STEM AEQUALIS 3BX74MM  [employer] [id number] Left 1 Implanted  IMPLANT REVERSE SHOULDER 0X3.5 - [id number] Shoulder IMPLANT REVERSE SHOULDER 0X3.[place] [id number] Left 1 Implanted  INSERT REV KIT SHOULDER 6X39 - [id number] Screw INSERT REV KIT SHOULDER 6X39  Jettie Pagan [id number] Left 1 Implanted    Indications for Surgery:   Adam Watts is a 68 y.o. male with continued left shoulder pain refractory to nonoperative measures.  Benefits and risks of operative and nonoperative management were discussed prior to surgery with patient/guardian(s) and informed consent form was completed.  Infection and need for further surgery were discussed as was prosthetic stability and cuff issues.  We additionally specifically discussed risks of axillary nerve injury, infection, periprosthetic fracture, continued pain and longevity of implants prior to beginning procedure.      Procedure:   The patient was identified in the preoperative holding area where the surgical site was marked. Block placed by anesthesia with exparel.  The patient was taken to the OR where a procedural timeout was called and the above noted anesthesia was induced.  The patient was positioned beachchair on allen table with spider arm positioner.  Preoperative antibiotics were dosed.  The patient's left shoulder was prepped and draped in the usual sterile fashion.  A second preoperative timeout was called.       Standard deltopectoral approach was performed with a #10 blade. We dissected down to the subcutaneous tissues and the cephalic vein was taken laterally with the deltoid. Clavipectoral fascia was incised in line with the incision. Deep retractors were  placed. The long of the biceps tendon was identified and there was significant tenosynovitis present.  Tenodesis was performed to the pectoralis tendon with #2 Ethibond.  The remaining biceps was followed up into the rotator interval where it was released.   The subscapularis was taken down in a full thickness layer with capsule along the humeral neck extending inferiorly around the humeral head. We continued releasing the capsule directly off of the osteophytes inferiorly all the way around the corner. This allowed [place] to dislocate the humeral head.   The humeral head had evidence of severe osteoarthritic wear with full-thickness cartilage loss and exposed subchondral bone. There was significant flattening of the humeral head.   The rotator cuff was carefully examined and noted to be irreperably torn.  The decision was confirmed that a reverse total shoulder was indicated for this patient.  There were osteophytes along the inferior humeral neck. The osteophytes were removed with an osteotome and a rongeur.  Osteophytes were removed with a rongeur and an osteotome and the anatomic neck was well visualized.     A humeral cutting guide was inserted down the intramedullary canal. The version was set at 20 of retroversion. Humeral osteotomy was performed with an oscillating saw. The head fragment was passed off the back table. A starter awl was used to open the humeral canal. We next used T-handle straight sound reamers to ream up to an appropriate fit. A chisel was used to remove proximal humeral bone. We then broached starting with a size one broach and broaching up to 3 which obtained an appropriate fit. The broach handle was removed. A cut protector was placed. The broach handle was removed and a cut protector was placed. The humerus was retracted posteriorly and we turned our attention to glenoid exposure.  The subscapularis was again identified and immediately we took care to palpate the axillary nerve anteriorly and verify its position with gentle palpation as well as the tug test.  We then released the SGHL with bovie cautery prior to placing a curved mayo at the  junction of the anterior glenoid well above the axillary nerve and bluntly dissecting the subscapularis from the capsule.  We then carefully protected the axillary nerve as we gently released the inferior capsule to fully mobilize the subscapularis.  An anterior deltoid retractor was then placed as well as a small Hohmann retractor superiorly.   The glenoid was inspected and had evidence of severe osteoarthritic wear with full-thickness cartilage loss and exposed subchondral bone. The remaining labrum was removed circumferentially taking great care not to disrupt the posterior capsule.   The glenoid drill guide was placed and used to drill a guide pin in the center, inferior position. The glenoid face was then reamed concentrically over the guide wire. The center hole was drilled over the guidepin in a near anatomic angle of version. Next the  glenoid vault was drilled back to a depth of 40 mm.  We tapped and then placed a 10m size baseplate with 250mlateralization was selected with a 6.5 mm x 40 mm length central screw.  The base plate was screwed into the glenoid vault obtaining secure fixation. We next placed superior and inferior locking screws for additional fixation.  Next a 39 mm glenosphere was selected and impacted onto the baseplate. The center screw was tightened.  We turned attention back to the humeral side. The cut protector was removed. We trialed with multiple size tray and polyethylene options and selected a 6 which  provided good stability and range of motion without excess soft tissue tension. The offset was dialed in to match the normal anatomy. The shoulder was trialed.  There was good ROM in all planes and the shoulder was stable with no inferior translation.  The real humeral implants were opened after again confirming sizes.  The trial was removed. #5 Fiberwire x4 sutures passed through the humeral neck for subscap repair. The humeral component was press-fit obtaining a secure fit. A  +0 high offset tray was selected and impacted onto the stem.  A 39+6 polyethylene liner was impacted onto the stem.  The joint was reduced and thoroughly irrigated with pulsatile lavage. Subscap was repaired back with #5 Fiberwire sutures through bone tunnels. Hemostasis was obtained. The deltopectoral interval was reapproximated with #1 Ethibond. The subcutaneous tissues were closed with 2-0 Vicryl and the skin was closed with running monocryl.    The wounds were cleaned and dried and an Aquacel dressing was placed. The drapes taken down. The arm was placed into sling with abduction pillow. Patient was awakened, extubated, and transferred to the recovery room in stable condition. There were no intraoperative complications. The sponge, needle, and attention counts were  correct at the end of the case.    Joya Gaskins, OPA-C, present and scrubbed throughout the case, critical for completion in a timely fashion, and for retraction, instrumentation, closure.

## 2019-02-24 NOTE — Anesthesia Procedure Notes (Signed)
Procedure Name: Intubation Date/Time: 02/24/2019 10:02 AM Performed by: Maxwell Caul, CRNA Pre-anesthesia Checklist: Patient identified, Emergency Drugs available, Suction available and Patient being monitored Patient Re-evaluated:Patient Re-evaluated prior to induction Oxygen Delivery Method: Circle system utilized Preoxygenation: Pre-oxygenation with 100% oxygen Induction Type: IV induction Laryngoscope Size: Mac and 4 Grade View: Grade I Tube type: Oral Tube size: 7.5 mm Number of attempts: 1 Airway Equipment and Method: Stylet Placement Confirmation: ETT inserted through vocal cords under direct vision,  positive ETCO2 and breath sounds checked- equal and bilateral Secured at: 21 cm Tube secured with: Tape Dental Injury: Teeth and Oropharynx as per pre-operative assessment

## 2019-02-24 NOTE — Transfer of Care (Signed)
Immediate Anesthesia Transfer of Care Note  Patient: Adam Watts  Procedure(s) Performed: REVERSE TOTAL SHOULDER ARTHROPLASTY (Left Shoulder)  Patient Location: PACU  Anesthesia Type:General  Level of Consciousness: awake, alert  and oriented  Airway & Oxygen Therapy: Patient Spontanous Breathing and Patient connected to face mask oxygen  Post-op Assessment: Report given to RN and Post -op Vital signs reviewed and stable  Post vital signs: Reviewed and stable  Last Vitals:  Vitals Value Taken Time  BP    Temp    Pulse    Resp    SpO2      Last Pain:  Vitals:   02/24/19 0823  TempSrc:   PainSc: 5       Patients Stated Pain Goal: 4 (96/29/52 8413)  Complications: No apparent anesthesia complications

## 2019-02-25 ENCOUNTER — Encounter (HOSPITAL_COMMUNITY): Payer: Self-pay | Admitting: Orthopaedic Surgery

## 2019-02-25 MED ORDER — ONDANSETRON HCL 4 MG PO TABS: 4.0000 mg | ORAL_TABLET | Freq: Three times a day (TID) | ORAL | 1 refills | Status: AC | PRN

## 2019-02-25 MED ORDER — SODIUM CHLORIDE 0.9 % IV SOLN
INTRAVENOUS | Status: DC | PRN
  Administered 2019-02-25: 500 mL via INTRAVENOUS

## 2019-02-25 MED ORDER — ACETAMINOPHEN 500 MG PO TABS: 1000.0000 mg | ORAL_TABLET | Freq: Three times a day (TID) | ORAL | 0 refills | Status: AC

## 2019-02-25 MED ORDER — OXYCODONE HCL 5 MG PO TABS: ORAL_TABLET | ORAL | 0 refills | Status: AC

## 2019-02-25 NOTE — Plan of Care (Signed)
Pt ready for dc today.

## 2019-02-25 NOTE — Discharge Summary (Signed)
Patient ID: Adam Watts MRN: [id number] DOB/AGE: Oct 25, 1950 68 y.o.  Admit date: 02/24/2019 Discharge date: 02/25/2019  Admission Diagnoses:L cuff tear arthropathy  Discharge Diagnoses:  Active Problems:   Rotator cuff arthropathy, left   Past Medical History:  Diagnosis Date  . A-fib ([place])   . Arthritis    RA all over  . Chest pain   . Chronic back pain   . Dysrhythmia    a-fib  . GERD (gastroesophageal reflux disease)   . Hypertension   . Lumbar radiculopathy   . Pacemaker    CHB, PAF  . Pneumonia   . Pre-diabetes   . Smoking      Procedures Performed: L reverse total shoulder arthroplasty  Discharged Condition: good  Hospital Course: Patient brought in as an outpatient for surgery.  Tolerated procedure well.  Was kept for monitoring overnight for pain control and medical monitoring postop and was found to be stable for DC home the morning after surgery.  Patient was instructed on specific activity restrictions and all questions were answered.   Consults: None  Significant Diagnostic Studies: No additional pertinent studies  Treatments: Surgery  Discharge Exam: Dressing CDI and sling well fitting,  full and painless ROM throughout hand with DPC of 0.  Axillary nerve sensation/motor altered in setting of block and unable to be fully tested.  Distal motor and sensory altered in setting of block.   Disposition: Discharge disposition: 01-Home or Self Care       Discharge Instructions    Call MD for:  persistant nausea and vomiting   Complete by: As directed    Call MD for:  redness, tenderness, or signs of infection (pain, swelling, redness, odor or green/yellow discharge around incision site)   Complete by: As directed    Call MD for:  severe uncontrolled pain   Complete by: As directed    Diet - low sodium heart healthy   Complete by: As directed    Discharge instructions   Complete by: As directed    Ophelia Charter MD, MPH [place]. [address], [address] [phone] (tel)   [phone] (fax)   [place] may leave the operative dressing in place until your follow-up appointment. KEEP THE INCISIONS CLEAN AND DRY. Use the provided ice machine or Ice packs as often as possible for the first 3-4 days, then as needed for pain relief.  Keep a layer of cloth or a shirt between your skin and the cooling unit to prevent frost bite as it can get very cold. You may shower on Post-Op Day #2. The dressing is water resistant but do not scrub it as it may start to peel up.  You may remove the sling for showering, but keep a water resistant pillow under the arm to keep both the elbow and shoulder away from the body (mimicking the abduction sling). Gently pat the area dry. Do not soak the shoulder in water. Do not go swimming in the pool or ocean until your sutures are removed.  EXERCISES Wear the sling at all times except when doing your exercises. You may remove the sling for showering, but keep the arm across the chest or in a secondary sling.   Accidental/Purposeful External Rotation and shoulder flexion (reaching behind you) is to be avoided at all costs for the first month. Please perform the exercises:   Elbow / Hand / Wrist  Range of  Motion Exercises  FOLLOW-UP If you develop a Fever (>101.5), Redness or Drainage from the surgical incision site, please call our office to arrange for an evaluation. Please call the office to schedule a follow-up appointment for a wound check, 7-10 days post-operatively.    IF YOU HAVE ANY QUESTIONS, PLEASE FEEL FREE TO CALL OUR OFFICE.   HELPFUL INFORMATION  Your arm will be in a sling following surgery. You will be in this sling for the next 3-4 weeks.  I will let you know the exact duration at your follow-up visit.  You may be more comfortable sleeping in a semi-seated position the first few nights  following surgery.  Keep a pillow propped under the elbow and forearm for comfort.  If you have a recliner type of chair it might be beneficial.  If not that is fine too, but it would be helpful to sleep propped up with pillows behind your operated shoulder as well under your elbow and forearm.  This will reduce pulling on the suture lines.  We suggest you use the pain medication the first night prior to going to bed, in order to ease any pain when the anesthesia wears off. You should avoid taking pain medications on an empty stomach as it will make you nauseous.  Do not drink alcoholic beverages or take illicit drugs when taking pain medications.  In most states it is against the law to drive while your arm is in a sling. And certainly against the law to drive while taking narcotics.  You may return to work/school in the next couple of days when you feel up to it. Desk work and typing in the sling is fine.  When dressing, put your operative arm in the sleeve first.  When getting undressed, take your operative arm out last.  Loose fitting, button-down shirts are recommended.  Pain medication may make you constipated.  Below are a few solutions to try in this order: Decrease the amount of pain medication if you aren't having pain. Drink lots of decaffeinated fluids. Drink prune juice and/or each dried prunes  If the first 3 don't work start with additional solutions Take Colace - an over-the-counter stool softener Take Senokot - an over-the-counter laxative Take Miralax - a stronger over-the-counter laxative   Increase activity slowly   Complete by: As directed      Allergies as of 02/25/2019   No Known Allergies     Medication List    STOP taking these medications   HYDROcodone-acetaminophen 5-325 MG tablet Commonly known as: NORCO/VICODIN   traMADol 50 MG tablet Commonly known as: ULTRAM     TAKE these medications   acetaminophen 500 MG tablet Commonly known as: TYLENOL Take  2 tablets (1,000 mg total) by mouth every 8 (eight) hours for 14 days.   CINNAMON PO Take 2,000 mg by mouth daily.   diltiazem 360 MG 24 hr capsule Commonly known as: TIAZAC TAKE ONE CAPSULE BY MOUTH EVERY DAY   Icy Hot Lidocaine Plus Menthol 4-1 % Crea Generic drug: Lidocaine-Menthol Apply 1 application topically daily as needed (pain).   loratadine 10 MG tablet Commonly known as: CLARITIN Take 10 mg by mouth daily.   losartan-hydrochlorothiazide 100-12.5 MG tablet Commonly known as: HYZAAR Take 1 tablet by mouth daily.   MEGA MULTIVITAMIN FOR MEN PO Take 1 tablet by mouth daily.   metFORMIN 500 MG 24 hr tablet Commonly known as: GLUCOPHAGE-XR Take 500 mg by mouth 2 (two) times daily with a meal.  omeprazole 20 MG capsule Commonly known as: PRILOSEC Take 20 mg by mouth daily.   ondansetron 4 MG tablet Commonly known as: Zofran Take 1 tablet (4 mg total) by mouth every 8 (eight) hours as needed for up to 7 days for nausea or vomiting.   oxyCODONE 5 MG immediate release tablet Commonly known as: Oxy IR/ROXICODONE Take 1-2 pills every 6 hrs as needed for pain, no more than 6 per day   Qunol Ultra CoQ10 100-150 MG-UNIT Caps Generic drug: Coenzyme Q10-Vitamin E Take 1 capsule by mouth daily.   vitamin B-12 1000 MCG tablet Commonly known as: CYANOCOBALAMIN Take 1,000 mcg by mouth daily.   Xarelto 20 MG Tabs tablet Generic drug: rivaroxaban TAKE 1 TABLET BY MOUTH DAILY WITH SUPPER What changed: See the new instructions.

## 2019-02-25 NOTE — Evaluation (Signed)
Occupational Therapy Evaluation Patient Details Name: Adam Watts MRN: [id number] DOB: 10-Sep-1950 Today's Date: 02/25/2019    History of Present Illness 68 year old man s/p L RTSA.  H/O back sx and pain, A Fib, and RA   Clinical Impression   Pt was admitted for the above sx.  Block was in effect during evaluation, so distal exercise, limited, but pt verbalizes understanding. He was limited walking by back pain.  Pt verbalizes understanding of all education, and he will follow up with Dr Griffin Basil    Follow Up Recommendations  Follow surgeon's recommendation for DC plan and follow-up therapies    Equipment Recommendations  None recommended by OT    Recommendations for Other Services       Precautions / Restrictions Precautions Precautions: Shoulder;Fall Type of Shoulder Precautions: sling on at all times except for bathing, dressing and exercises. No shoulder movement. May move elbow to fingers Shoulder Interventions: Shoulder abduction pillow Precaution Booklet Issued: Yes (comment) Restrictions Other Position/Activity Restrictions: NWB LUE      Mobility Bed Mobility               General bed mobility comments: min A for OOB. Pt plans to stay in recliner initially  Transfers Overall transfer level: Needs assistance Equipment used: (hurricane cane) Transfers: Sit to/from Stand Sit to Stand: Min guard         General transfer comment: for safety    Balance                                           ADL either performed or assessed with clinical judgement   ADL Overall ADL's : Needs assistance/impaired Eating/Feeding: Set up   Grooming: Set up   Upper Body Bathing: Moderate assistance   Lower Body Bathing: Moderate assistance   Upper Body Dressing : Moderate assistance   Lower Body Dressing: Maximal assistance   Toilet Transfer: Minimal assistance;Min guard             General ADL Comments: performed adl and reviewed  protocol. Pt reports his wife has assisted with an abductor sling in the past. He is having significant back pain and seeing someone for this. Walked out to hall and he was limited by back pain; light min A for safety.  Reviewed exercises, but pt unable to do yet (elbow to fingers)     Vision         Perception     Praxis      Pertinent Vitals/Pain Pain Assessment: Faces Faces Pain Scale: Hurts whole lot Pain Location: back and hip when walking Pain Descriptors / Indicators: Aching Pain Intervention(s): Limited activity within patient's tolerance;Monitored during session;Repositioned     Hand Dominance Right   Extremity/Trunk Assessment Upper Extremity Assessment Upper Extremity Assessment: RUE deficits/detail(block in effect; some movement with wrist and fingers)           Communication Communication Communication: No difficulties   Cognition Arousal/Alertness: Awake/alert Behavior During Therapy: WFL for tasks assessed/performed Overall Cognitive Status: Within Functional Limits for tasks assessed                                     General Comments       Exercises     Shoulder Instructions      Home Living  Family/patient expects to be discharged to:: Private residence Living Arrangements: Spouse/significant other                 Bathroom Shower/Tub: Occupational psychologist: Handicapped height     [place]: (hurricane cane)          Prior Functioning/Environment Level of Independence: Independent with assistive device(s)        Comments: cane        OT Problem List:        OT Treatment/Interventions:      OT Goals(Current goals can be found in the care plan section) Acute Rehab OT Goals Patient Stated Goal: get shoulder healed so I can take care of back and get back to fishing at the beach OT Goal Formulation: All assessment and education complete, DC therapy  OT Frequency:     Barriers to D/C:             Co-evaluation              AM-PAC OT "6 Clicks" Daily Activity     Outcome Measure Help from another person eating meals?: A Little Help from another person taking care of personal grooming?: A Little Help from another person toileting, which includes using toliet, bedpan, or urinal?: A Lot Help from another person bathing (including washing, rinsing, drying)?: A Lot Help from another person to put on and taking off regular upper body clothing?: A Lot Help from another person to put on and taking off regular lower body clothing?: A Lot 6 Click Score: 14   End of Session    Activity Tolerance: Patient tolerated treatment well Patient left: in chair;with call bell/phone within reach  OT Visit Diagnosis: Muscle weakness (generalized) (M62.81)                Time: 9030-0923 OT Time Calculation (min): 47 min Charges:  OT General Charges $OT Visit: 1 Visit OT Evaluation $OT Eval Low Complexity: 1 Low OT Treatments $Self Care/Home Management : 23-37 mins  Lesle Chris, OTR/L Acute Rehabilitation Services [phone] [facility] pager [phone] office 02/25/2019  [place] 02/25/2019, 10:50 AM

## 2019-02-25 NOTE — Anesthesia Postprocedure Evaluation (Signed)
Anesthesia Post Note  Patient: Adam Watts  Procedure(s) Performed: REVERSE TOTAL SHOULDER ARTHROPLASTY (Left Shoulder)     Patient location during evaluation: PACU Anesthesia Type: General Level of consciousness: awake and alert Pain management: pain level controlled Vital Signs Assessment: post-procedure vital signs reviewed and stable Respiratory status: spontaneous breathing, nonlabored ventilation, respiratory function stable and patient connected to nasal cannula oxygen Cardiovascular status: blood pressure returned to baseline and stable Postop Assessment: no apparent nausea or vomiting Anesthetic complications: no    Last Vitals:  Vitals:   02/25/19 0243 02/25/19 0533  BP: 140/81 (!) 168/86  Pulse: 73 70  Resp: 18 18  Temp: 36.4 C 36.4 C  SpO2: 97% 100%    Last Pain:  Vitals:   02/25/19 0533  TempSrc: Oral  PainSc:                  [place] S

## 2019-03-05 DIAGNOSIS — M19012 Primary osteoarthritis, left shoulder: Secondary | ICD-10-CM | POA: Diagnosis not present

## 2019-03-09 DIAGNOSIS — Z96612 Presence of left artificial shoulder joint: Secondary | ICD-10-CM | POA: Diagnosis not present

## 2019-03-09 DIAGNOSIS — M25512 Pain in left shoulder: Secondary | ICD-10-CM | POA: Diagnosis not present

## 2019-03-09 DIAGNOSIS — M25612 Stiffness of left shoulder, not elsewhere classified: Secondary | ICD-10-CM | POA: Diagnosis not present

## 2019-03-10 DIAGNOSIS — M9903 Segmental and somatic dysfunction of lumbar region: Secondary | ICD-10-CM | POA: Diagnosis not present

## 2019-03-10 DIAGNOSIS — S338XXA Sprain of other parts of lumbar spine and pelvis, initial encounter: Secondary | ICD-10-CM | POA: Diagnosis not present

## 2019-03-10 DIAGNOSIS — M47816 Spondylosis without myelopathy or radiculopathy, lumbar region: Secondary | ICD-10-CM | POA: Diagnosis not present

## 2019-03-11 DIAGNOSIS — M47816 Spondylosis without myelopathy or radiculopathy, lumbar region: Secondary | ICD-10-CM | POA: Diagnosis not present

## 2019-03-11 DIAGNOSIS — S338XXA Sprain of other parts of lumbar spine and pelvis, initial encounter: Secondary | ICD-10-CM | POA: Diagnosis not present

## 2019-03-11 DIAGNOSIS — M9903 Segmental and somatic dysfunction of lumbar region: Secondary | ICD-10-CM | POA: Diagnosis not present

## 2019-03-12 DIAGNOSIS — M25612 Stiffness of left shoulder, not elsewhere classified: Secondary | ICD-10-CM | POA: Diagnosis not present

## 2019-03-12 DIAGNOSIS — Z96612 Presence of left artificial shoulder joint: Secondary | ICD-10-CM | POA: Diagnosis not present

## 2019-03-12 DIAGNOSIS — M25512 Pain in left shoulder: Secondary | ICD-10-CM | POA: Diagnosis not present

## 2019-03-15 DIAGNOSIS — M9903 Segmental and somatic dysfunction of lumbar region: Secondary | ICD-10-CM | POA: Diagnosis not present

## 2019-03-15 DIAGNOSIS — S338XXA Sprain of other parts of lumbar spine and pelvis, initial encounter: Secondary | ICD-10-CM | POA: Diagnosis not present

## 2019-03-15 DIAGNOSIS — M47816 Spondylosis without myelopathy or radiculopathy, lumbar region: Secondary | ICD-10-CM | POA: Diagnosis not present

## 2019-03-16 DIAGNOSIS — S338XXA Sprain of other parts of lumbar spine and pelvis, initial encounter: Secondary | ICD-10-CM | POA: Diagnosis not present

## 2019-03-16 DIAGNOSIS — M47816 Spondylosis without myelopathy or radiculopathy, lumbar region: Secondary | ICD-10-CM | POA: Diagnosis not present

## 2019-03-16 DIAGNOSIS — M9903 Segmental and somatic dysfunction of lumbar region: Secondary | ICD-10-CM | POA: Diagnosis not present

## 2019-03-17 DIAGNOSIS — Z96612 Presence of left artificial shoulder joint: Secondary | ICD-10-CM | POA: Diagnosis not present

## 2019-03-17 DIAGNOSIS — M25512 Pain in left shoulder: Secondary | ICD-10-CM | POA: Diagnosis not present

## 2019-03-17 DIAGNOSIS — S338XXA Sprain of other parts of lumbar spine and pelvis, initial encounter: Secondary | ICD-10-CM | POA: Diagnosis not present

## 2019-03-17 DIAGNOSIS — M25612 Stiffness of left shoulder, not elsewhere classified: Secondary | ICD-10-CM | POA: Diagnosis not present

## 2019-03-17 DIAGNOSIS — M9903 Segmental and somatic dysfunction of lumbar region: Secondary | ICD-10-CM | POA: Diagnosis not present

## 2019-03-17 DIAGNOSIS — M47816 Spondylosis without myelopathy or radiculopathy, lumbar region: Secondary | ICD-10-CM | POA: Diagnosis not present

## 2019-03-19 DIAGNOSIS — S338XXA Sprain of other parts of lumbar spine and pelvis, initial encounter: Secondary | ICD-10-CM | POA: Diagnosis not present

## 2019-03-19 DIAGNOSIS — M9903 Segmental and somatic dysfunction of lumbar region: Secondary | ICD-10-CM | POA: Diagnosis not present

## 2019-03-19 DIAGNOSIS — M25512 Pain in left shoulder: Secondary | ICD-10-CM | POA: Diagnosis not present

## 2019-03-19 DIAGNOSIS — M25612 Stiffness of left shoulder, not elsewhere classified: Secondary | ICD-10-CM | POA: Diagnosis not present

## 2019-03-19 DIAGNOSIS — M47816 Spondylosis without myelopathy or radiculopathy, lumbar region: Secondary | ICD-10-CM | POA: Diagnosis not present

## 2019-03-19 DIAGNOSIS — Z96612 Presence of left artificial shoulder joint: Secondary | ICD-10-CM | POA: Diagnosis not present

## 2019-03-22 DIAGNOSIS — M25612 Stiffness of left shoulder, not elsewhere classified: Secondary | ICD-10-CM | POA: Diagnosis not present

## 2019-03-22 DIAGNOSIS — M47816 Spondylosis without myelopathy or radiculopathy, lumbar region: Secondary | ICD-10-CM | POA: Diagnosis not present

## 2019-03-22 DIAGNOSIS — M9903 Segmental and somatic dysfunction of lumbar region: Secondary | ICD-10-CM | POA: Diagnosis not present

## 2019-03-22 DIAGNOSIS — Z96612 Presence of left artificial shoulder joint: Secondary | ICD-10-CM | POA: Diagnosis not present

## 2019-03-22 DIAGNOSIS — S338XXA Sprain of other parts of lumbar spine and pelvis, initial encounter: Secondary | ICD-10-CM | POA: Diagnosis not present

## 2019-03-22 DIAGNOSIS — M25512 Pain in left shoulder: Secondary | ICD-10-CM | POA: Diagnosis not present

## 2019-03-23 DIAGNOSIS — Z96612 Presence of left artificial shoulder joint: Secondary | ICD-10-CM | POA: Diagnosis not present

## 2019-03-24 ENCOUNTER — Ambulatory Visit (INDEPENDENT_AMBULATORY_CARE_PROVIDER_SITE_OTHER): Payer: Medicare Other | Admitting: *Deleted

## 2019-03-24 DIAGNOSIS — M9903 Segmental and somatic dysfunction of lumbar region: Secondary | ICD-10-CM | POA: Diagnosis not present

## 2019-03-24 DIAGNOSIS — M25512 Pain in left shoulder: Secondary | ICD-10-CM | POA: Diagnosis not present

## 2019-03-24 DIAGNOSIS — M25612 Stiffness of left shoulder, not elsewhere classified: Secondary | ICD-10-CM | POA: Diagnosis not present

## 2019-03-24 DIAGNOSIS — M47816 Spondylosis without myelopathy or radiculopathy, lumbar region: Secondary | ICD-10-CM | POA: Diagnosis not present

## 2019-03-24 DIAGNOSIS — Z96612 Presence of left artificial shoulder joint: Secondary | ICD-10-CM | POA: Diagnosis not present

## 2019-03-24 DIAGNOSIS — I442 Atrioventricular block, complete: Secondary | ICD-10-CM

## 2019-03-24 DIAGNOSIS — S338XXA Sprain of other parts of lumbar spine and pelvis, initial encounter: Secondary | ICD-10-CM | POA: Diagnosis not present

## 2019-03-24 LAB — CUP PACEART REMOTE DEVICE CHECK
Battery Impedance: 596 Ohm
Battery Remaining Longevity: 88 mo
Battery Voltage: 2.79 V
Brady Statistic AP VP Percent: 9 %
Brady Statistic AP VS Percent: 3 %
Brady Statistic AS VP Percent: 51 %
Brady Statistic AS VS Percent: 38 %
Date Time Interrogation Session: 20200826120959
Implantable Lead Implant Date: 20140421
Implantable Lead Implant Date: 20140421
Implantable Lead Location: 753859
Implantable Lead Location: 753860
Implantable Lead Model: 5076
Implantable Lead Model: 5592
Implantable Pulse Generator Implant Date: 20140421
Lead Channel Impedance Value: 521 Ohm
Lead Channel Impedance Value: 677 Ohm
Lead Channel Pacing Threshold Amplitude: 0.375 V
Lead Channel Pacing Threshold Amplitude: 0.5 V
Lead Channel Pacing Threshold Pulse Width: 0.4 ms
Lead Channel Pacing Threshold Pulse Width: 0.4 ms
Lead Channel Setting Pacing Amplitude: 1.5 V
Lead Channel Setting Pacing Amplitude: 2 V
Lead Channel Setting Pacing Pulse Width: 0.4 ms
Lead Channel Setting Sensing Sensitivity: 5.6 mV

## 2019-03-25 DIAGNOSIS — M0579 Rheumatoid arthritis with rheumatoid factor of multiple sites without organ or systems involvement: Secondary | ICD-10-CM | POA: Diagnosis not present

## 2019-03-26 DIAGNOSIS — S338XXA Sprain of other parts of lumbar spine and pelvis, initial encounter: Secondary | ICD-10-CM | POA: Diagnosis not present

## 2019-03-26 DIAGNOSIS — M47816 Spondylosis without myelopathy or radiculopathy, lumbar region: Secondary | ICD-10-CM | POA: Diagnosis not present

## 2019-03-26 DIAGNOSIS — M9903 Segmental and somatic dysfunction of lumbar region: Secondary | ICD-10-CM | POA: Diagnosis not present

## 2019-03-29 DIAGNOSIS — M25512 Pain in left shoulder: Secondary | ICD-10-CM | POA: Diagnosis not present

## 2019-03-29 DIAGNOSIS — Z96612 Presence of left artificial shoulder joint: Secondary | ICD-10-CM | POA: Diagnosis not present

## 2019-03-29 DIAGNOSIS — M25612 Stiffness of left shoulder, not elsewhere classified: Secondary | ICD-10-CM | POA: Diagnosis not present

## 2019-03-30 DIAGNOSIS — I10 Essential (primary) hypertension: Secondary | ICD-10-CM | POA: Diagnosis not present

## 2019-03-30 DIAGNOSIS — M545 Low back pain: Secondary | ICD-10-CM | POA: Diagnosis not present

## 2019-03-30 DIAGNOSIS — M069 Rheumatoid arthritis, unspecified: Secondary | ICD-10-CM | POA: Diagnosis not present

## 2019-03-31 DIAGNOSIS — M25512 Pain in left shoulder: Secondary | ICD-10-CM | POA: Diagnosis not present

## 2019-03-31 DIAGNOSIS — M25612 Stiffness of left shoulder, not elsewhere classified: Secondary | ICD-10-CM | POA: Diagnosis not present

## 2019-03-31 DIAGNOSIS — Z96612 Presence of left artificial shoulder joint: Secondary | ICD-10-CM | POA: Diagnosis not present

## 2019-04-02 ENCOUNTER — Encounter: Payer: Self-pay | Admitting: Cardiology

## 2019-04-02 NOTE — Progress Notes (Signed)
Remote pacemaker transmission.   

## 2019-04-06 DIAGNOSIS — Z96612 Presence of left artificial shoulder joint: Secondary | ICD-10-CM | POA: Diagnosis not present

## 2019-04-06 DIAGNOSIS — M25512 Pain in left shoulder: Secondary | ICD-10-CM | POA: Diagnosis not present

## 2019-04-06 DIAGNOSIS — M25612 Stiffness of left shoulder, not elsewhere classified: Secondary | ICD-10-CM | POA: Diagnosis not present

## 2019-04-07 DIAGNOSIS — M47816 Spondylosis without myelopathy or radiculopathy, lumbar region: Secondary | ICD-10-CM | POA: Diagnosis not present

## 2019-04-07 DIAGNOSIS — M9903 Segmental and somatic dysfunction of lumbar region: Secondary | ICD-10-CM | POA: Diagnosis not present

## 2019-04-07 DIAGNOSIS — S338XXA Sprain of other parts of lumbar spine and pelvis, initial encounter: Secondary | ICD-10-CM | POA: Diagnosis not present

## 2019-04-09 DIAGNOSIS — M25612 Stiffness of left shoulder, not elsewhere classified: Secondary | ICD-10-CM | POA: Diagnosis not present

## 2019-04-09 DIAGNOSIS — Z96612 Presence of left artificial shoulder joint: Secondary | ICD-10-CM | POA: Diagnosis not present

## 2019-04-09 DIAGNOSIS — M25512 Pain in left shoulder: Secondary | ICD-10-CM | POA: Diagnosis not present

## 2019-04-14 DIAGNOSIS — Z96612 Presence of left artificial shoulder joint: Secondary | ICD-10-CM | POA: Diagnosis not present

## 2019-04-14 DIAGNOSIS — M25512 Pain in left shoulder: Secondary | ICD-10-CM | POA: Diagnosis not present

## 2019-04-14 DIAGNOSIS — M25612 Stiffness of left shoulder, not elsewhere classified: Secondary | ICD-10-CM | POA: Diagnosis not present

## 2019-04-26 DIAGNOSIS — Z96612 Presence of left artificial shoulder joint: Secondary | ICD-10-CM | POA: Diagnosis not present

## 2019-04-26 DIAGNOSIS — M25512 Pain in left shoulder: Secondary | ICD-10-CM | POA: Diagnosis not present

## 2019-04-26 DIAGNOSIS — M25612 Stiffness of left shoulder, not elsewhere classified: Secondary | ICD-10-CM | POA: Diagnosis not present

## 2019-04-28 DIAGNOSIS — Z96612 Presence of left artificial shoulder joint: Secondary | ICD-10-CM | POA: Diagnosis not present

## 2019-04-28 DIAGNOSIS — M25612 Stiffness of left shoulder, not elsewhere classified: Secondary | ICD-10-CM | POA: Diagnosis not present

## 2019-04-28 DIAGNOSIS — M25512 Pain in left shoulder: Secondary | ICD-10-CM | POA: Diagnosis not present

## 2019-04-29 DIAGNOSIS — Z23 Encounter for immunization: Secondary | ICD-10-CM | POA: Diagnosis not present

## 2019-05-03 DIAGNOSIS — M25512 Pain in left shoulder: Secondary | ICD-10-CM | POA: Diagnosis not present

## 2019-05-03 DIAGNOSIS — Z96612 Presence of left artificial shoulder joint: Secondary | ICD-10-CM | POA: Diagnosis not present

## 2019-05-03 DIAGNOSIS — M25612 Stiffness of left shoulder, not elsewhere classified: Secondary | ICD-10-CM | POA: Diagnosis not present

## 2019-05-06 DIAGNOSIS — Z96612 Presence of left artificial shoulder joint: Secondary | ICD-10-CM | POA: Diagnosis not present

## 2019-05-06 DIAGNOSIS — M25512 Pain in left shoulder: Secondary | ICD-10-CM | POA: Diagnosis not present

## 2019-05-06 DIAGNOSIS — M25612 Stiffness of left shoulder, not elsewhere classified: Secondary | ICD-10-CM | POA: Diagnosis not present

## 2019-05-10 DIAGNOSIS — M25612 Stiffness of left shoulder, not elsewhere classified: Secondary | ICD-10-CM | POA: Diagnosis not present

## 2019-05-10 DIAGNOSIS — Z96612 Presence of left artificial shoulder joint: Secondary | ICD-10-CM | POA: Diagnosis not present

## 2019-05-10 DIAGNOSIS — M25512 Pain in left shoulder: Secondary | ICD-10-CM | POA: Diagnosis not present

## 2019-05-11 ENCOUNTER — Other Ambulatory Visit: Payer: Self-pay | Admitting: Internal Medicine

## 2019-05-11 NOTE — Telephone Encounter (Signed)
Xarelto 20mg  refill request received; pt is 68 years old, weight- 79.6kg, Crea-1.08 on 02/15/2019, last seen by Dr. Lovena Le on 09/18/2018, Diagnosis-Afib , CrCl-72.69ml/min. Dose is appropriate based on dosing criteria. Will send in refill to requested pharmacy.

## 2019-05-13 DIAGNOSIS — Z96612 Presence of left artificial shoulder joint: Secondary | ICD-10-CM | POA: Diagnosis not present

## 2019-05-13 DIAGNOSIS — M25512 Pain in left shoulder: Secondary | ICD-10-CM | POA: Diagnosis not present

## 2019-05-13 DIAGNOSIS — M25612 Stiffness of left shoulder, not elsewhere classified: Secondary | ICD-10-CM | POA: Diagnosis not present

## 2019-05-18 DIAGNOSIS — Z96612 Presence of left artificial shoulder joint: Secondary | ICD-10-CM | POA: Diagnosis not present

## 2019-05-20 DIAGNOSIS — Z79899 Other long term (current) drug therapy: Secondary | ICD-10-CM | POA: Diagnosis not present

## 2019-05-20 DIAGNOSIS — M0579 Rheumatoid arthritis with rheumatoid factor of multiple sites without organ or systems involvement: Secondary | ICD-10-CM | POA: Diagnosis not present

## 2019-05-21 DIAGNOSIS — M25612 Stiffness of left shoulder, not elsewhere classified: Secondary | ICD-10-CM | POA: Diagnosis not present

## 2019-05-21 DIAGNOSIS — Z96612 Presence of left artificial shoulder joint: Secondary | ICD-10-CM | POA: Diagnosis not present

## 2019-05-21 DIAGNOSIS — M25512 Pain in left shoulder: Secondary | ICD-10-CM | POA: Diagnosis not present

## 2019-05-24 DIAGNOSIS — M25612 Stiffness of left shoulder, not elsewhere classified: Secondary | ICD-10-CM | POA: Diagnosis not present

## 2019-05-24 DIAGNOSIS — Z96612 Presence of left artificial shoulder joint: Secondary | ICD-10-CM | POA: Diagnosis not present

## 2019-05-24 DIAGNOSIS — M25512 Pain in left shoulder: Secondary | ICD-10-CM | POA: Diagnosis not present

## 2019-06-14 DIAGNOSIS — M150 Primary generalized (osteo)arthritis: Secondary | ICD-10-CM | POA: Diagnosis not present

## 2019-06-14 DIAGNOSIS — M5417 Radiculopathy, lumbosacral region: Secondary | ICD-10-CM | POA: Diagnosis not present

## 2019-06-14 DIAGNOSIS — M0579 Rheumatoid arthritis with rheumatoid factor of multiple sites without organ or systems involvement: Secondary | ICD-10-CM | POA: Diagnosis not present

## 2019-06-14 DIAGNOSIS — Z79899 Other long term (current) drug therapy: Secondary | ICD-10-CM | POA: Diagnosis not present

## 2019-06-14 DIAGNOSIS — M2550 Pain in unspecified joint: Secondary | ICD-10-CM | POA: Diagnosis not present

## 2019-06-23 ENCOUNTER — Ambulatory Visit (INDEPENDENT_AMBULATORY_CARE_PROVIDER_SITE_OTHER): Payer: Medicare Other | Admitting: *Deleted

## 2019-06-23 DIAGNOSIS — Z950 Presence of cardiac pacemaker: Secondary | ICD-10-CM

## 2019-06-26 LAB — CUP PACEART REMOTE DEVICE CHECK
Battery Impedance: 620 Ohm
Battery Remaining Longevity: 83 mo
Battery Voltage: 2.79 V
Brady Statistic AP VP Percent: 17 %
Brady Statistic AP VS Percent: 4 %
Brady Statistic AS VP Percent: 53 %
Brady Statistic AS VS Percent: 26 %
Date Time Interrogation Session: 20201126101519
Implantable Lead Implant Date: 20140421
Implantable Lead Implant Date: 20140421
Implantable Lead Location: 753859
Implantable Lead Location: 753860
Implantable Lead Model: 5076
Implantable Lead Model: 5592
Implantable Pulse Generator Implant Date: 20140421
Lead Channel Impedance Value: 521 Ohm
Lead Channel Impedance Value: 625 Ohm
Lead Channel Pacing Threshold Amplitude: 0.5 V
Lead Channel Pacing Threshold Amplitude: 0.5 V
Lead Channel Pacing Threshold Pulse Width: 0.4 ms
Lead Channel Pacing Threshold Pulse Width: 0.4 ms
Lead Channel Setting Pacing Amplitude: 1.5 V
Lead Channel Setting Pacing Amplitude: 2 V
Lead Channel Setting Pacing Pulse Width: 0.4 ms
Lead Channel Setting Sensing Sensitivity: 5.6 mV

## 2019-06-29 DIAGNOSIS — E119 Type 2 diabetes mellitus without complications: Secondary | ICD-10-CM | POA: Diagnosis not present

## 2019-06-29 DIAGNOSIS — I10 Essential (primary) hypertension: Secondary | ICD-10-CM | POA: Diagnosis not present

## 2019-06-29 DIAGNOSIS — I4891 Unspecified atrial fibrillation: Secondary | ICD-10-CM | POA: Diagnosis not present

## 2019-06-29 DIAGNOSIS — M069 Rheumatoid arthritis, unspecified: Secondary | ICD-10-CM | POA: Diagnosis not present

## 2019-06-29 LAB — HEMOGLOBIN A1C: Hemoglobin A1C: 6.3

## 2019-06-29 LAB — HEPATIC FUNCTION PANEL
ALT: 9 — AB (ref 10–40)
AST: 15 (ref 14–40)
Alkaline Phosphatase: 85 (ref 25–125)

## 2019-06-29 LAB — COMPREHENSIVE METABOLIC PANEL
Albumin: 4 (ref 3.5–5.0)
Calcium: 9.8 (ref 8.7–10.7)
GFR calc Af Amer: 103
GFR calc non Af Amer: 88
Globulin: 2.9

## 2019-06-29 LAB — BASIC METABOLIC PANEL
BUN: 7 (ref 4–21)
CO2: 30 — AB (ref 13–22)
Chloride: 97 — AB (ref 99–108)
Creatinine: 0.9 (ref <=1.3)
Glucose: 125
Potassium: 3 — AB (ref 3.4–5.3)
Sodium: 139 (ref 137–147)

## 2019-06-29 LAB — LIPID PANEL
Cholesterol: 167 (ref 0–200)
HDL: 65 (ref 35–70)
LDL Cholesterol: 79
Triglycerides: 130 (ref 40–160)

## 2019-06-29 LAB — CBC AND DIFFERENTIAL
HCT: 48 (ref 41–53)
Hemoglobin: 16.7 (ref 13.5–17.5)
Platelets: 280 (ref 150–399)
WBC: 8.3

## 2019-06-29 LAB — CBC: RBC: 5.5 — AB (ref 3.87–5.11)

## 2019-07-07 DIAGNOSIS — F17200 Nicotine dependence, unspecified, uncomplicated: Secondary | ICD-10-CM

## 2019-07-07 DIAGNOSIS — K4090 Unilateral inguinal hernia, without obstruction or gangrene, not specified as recurrent: Secondary | ICD-10-CM

## 2019-07-07 DIAGNOSIS — Z950 Presence of cardiac pacemaker: Secondary | ICD-10-CM

## 2019-07-07 DIAGNOSIS — M79604 Pain in right leg: Secondary | ICD-10-CM

## 2019-07-07 DIAGNOSIS — J029 Acute pharyngitis, unspecified: Secondary | ICD-10-CM

## 2019-07-07 DIAGNOSIS — E1165 Type 2 diabetes mellitus with hyperglycemia: Secondary | ICD-10-CM | POA: Insufficient documentation

## 2019-07-07 DIAGNOSIS — E785 Hyperlipidemia, unspecified: Secondary | ICD-10-CM

## 2019-07-07 DIAGNOSIS — I4891 Unspecified atrial fibrillation: Secondary | ICD-10-CM

## 2019-07-07 DIAGNOSIS — E119 Type 2 diabetes mellitus without complications: Secondary | ICD-10-CM

## 2019-07-07 DIAGNOSIS — M25512 Pain in left shoulder: Secondary | ICD-10-CM

## 2019-07-07 DIAGNOSIS — G4700 Insomnia, unspecified: Secondary | ICD-10-CM

## 2019-07-07 DIAGNOSIS — K2100 Gastro-esophageal reflux disease with esophagitis, without bleeding: Secondary | ICD-10-CM

## 2019-07-07 DIAGNOSIS — E782 Mixed hyperlipidemia: Secondary | ICD-10-CM | POA: Insufficient documentation

## 2019-07-07 DIAGNOSIS — M069 Rheumatoid arthritis, unspecified: Secondary | ICD-10-CM

## 2019-07-07 DIAGNOSIS — F5101 Primary insomnia: Secondary | ICD-10-CM | POA: Insufficient documentation

## 2019-08-09 ENCOUNTER — Other Ambulatory Visit: Payer: Self-pay | Admitting: Internal Medicine

## 2019-08-25 ENCOUNTER — Other Ambulatory Visit: Payer: Self-pay

## 2019-08-25 ENCOUNTER — Ambulatory Visit (INDEPENDENT_AMBULATORY_CARE_PROVIDER_SITE_OTHER): Payer: Medicare Other | Admitting: Internal Medicine

## 2019-08-25 ENCOUNTER — Encounter: Payer: Self-pay | Admitting: Internal Medicine

## 2019-08-25 VITALS — BP 161/89 | HR 98 | Temp 97.4°F | Ht 67.0 in | Wt 182.2 lb

## 2019-08-25 DIAGNOSIS — Z950 Presence of cardiac pacemaker: Secondary | ICD-10-CM | POA: Diagnosis not present

## 2019-08-25 DIAGNOSIS — I442 Atrioventricular block, complete: Secondary | ICD-10-CM

## 2019-08-25 DIAGNOSIS — I480 Paroxysmal atrial fibrillation: Secondary | ICD-10-CM

## 2019-08-25 DIAGNOSIS — I10 Essential (primary) hypertension: Secondary | ICD-10-CM | POA: Diagnosis not present

## 2019-08-25 NOTE — Patient Instructions (Signed)
Medication Instructions:  Your physician recommends that you continue on your current medications as directed. Please refer to the Current Medication list given to you today.  *If you need a refill on your cardiac medications before your next appointment, please call your pharmacy*  Lab Work: NONE  If you have labs (blood work) drawn today and your tests are completely normal, you will receive your results only by: . MyChart Message (if you have MyChart) OR . A paper copy in the mail If you have any lab test that is abnormal or we need to change your treatment, we will call you to review the results.  Testing/Procedures: NONE   Follow-Up: At [facility], you and your health needs are our priority.  As part of our continuing mission to provide you with exceptional heart care, we have created designated Provider Care Teams.  These Care Teams include your primary Cardiologist (physician) and Advanced Practice Providers (APPs -  Physician Assistants and Nurse Practitioners) who all work together to provide you with the care you need, when you need it.  Your next appointment:   1 year(s)  The format for your next appointment:   In Person  Provider:   Gregg Taylor, MD  Other Instructions Thank you for choosing [facility]!    

## 2019-08-25 NOTE — Progress Notes (Signed)
HPI Mr. Garman returns today for followup. He has a h/o PAF, CHB, s/p PPM insertion, and HTN. In the interim, he has been stable with no worsening sob or palpitations. His shoulder is improved.  No Known Allergies   Current Outpatient Medications  Medication Sig Dispense Refill  . CINNAMON PO Take 2,000 mg by mouth daily.     . Coenzyme Q10-Vitamin E (QUNOL ULTRA COQ10) 100-150 MG-UNIT CAPS Take 1 capsule by mouth daily.    Marland Kitchen diltiazem (TIAZAC) 360 MG 24 hr capsule Take 1 capsule (360 mg total) by mouth daily. Please keep upcoming appt for refills. Thank you 90 capsule 0  . golimumab (SIMPONI ARIA) 50 MG/4ML SOLN injection Inject into the vein every 8 (eight) weeks.    . Lidocaine-Menthol (ICY HOT LIDOCAINE PLUS MENTHOL) 4-1 % CREA Apply 1 application topically daily as needed (pain).    Marland Kitchen loratadine (CLARITIN) 10 MG tablet Take 10 mg by mouth daily.    Marland Kitchen losartan-hydrochlorothiazide (HYZAAR) 100-12.5 MG per tablet Take 1 tablet by mouth daily.    . metFORMIN (GLUCOPHAGE-XR) 500 MG 24 hr tablet Take 500 mg by mouth 2 (two) times daily with a meal.  12  . Multiple Vitamins-Minerals (MEGA MULTIVITAMIN FOR MEN PO) Take 1 tablet by mouth daily.     Marland Kitchen omeprazole (PRILOSEC) 20 MG capsule Take 20 mg by mouth daily.    . tadalafil (CIALIS) 20 MG tablet Take 20 mg by mouth as directed.    . vitamin B-12 (CYANOCOBALAMIN) 1000 MCG tablet Take 1,000 mcg by mouth daily.    Alveda Reasons 20 MG TABS tablet TAKE 1 TABLET BY MOUTH DAILY WITH SUPPER 90 tablet 1   No current facility-administered medications for this visit.   Facility-Administered Medications Ordered in Other Visits  Medication Dose Route Frequency Provider Last Rate Last Admin  . ondansetron (ZOFRAN) 4 mg in sodium chloride 0.9 % 50 mL IVPB  4 mg Intravenous Q6H PRN Ashok Pall, MD         Past Medical History:  Diagnosis Date  . A-fib ([place])   . Acute pharyngitis, unspecified   . Arthritis    RA all over  . Arthritis   .  Chest pain   . Chronic back pain   . Dysrhythmia    a-fib  . Gastro-esophageal reflux disease with esophagitis   . GERD (gastroesophageal reflux disease)   . Hyperlipidemia, unspecified   . Hypertension   . Insomnia, unspecified   . Lumbar radiculopathy   . Nicotine dependence, unspecified, uncomplicated   . Pacemaker    CHB, PAF  . Pain in joint involving shoulder region   . Pain in left shoulder   . Pain in right leg   . Pneumonia   . Pre-diabetes   . Presence of cardiac pacemaker   . Rheumatoid arthritis, unspecified ([place])   . Smoking   . Type 2 diabetes mellitus without complications ([place])   . Unilateral inguinal hernia, without obstruction or gangrene, not specified as recurrent   . Unspecified atrial fibrillation (HCC)     ROS:   All systems reviewed and negative except as noted in the HPI.   Past Surgical History:  Procedure Laterality Date  . CHOLECYSTECTOMY    . DENTAL SURGERY     implants  . INGUINAL HERNIA REPAIR Bilateral 04/24/2016   Procedure: LAPAROSCOPIC BILATERAL INGUINAL HERNIA REPAIR;  Surgeon: Coralie Keens, MD;  Location: [place];  Service: General;  Laterality: Bilateral;  .  INSERTION OF MESH Bilateral 04/24/2016   Procedure: INSERTION OF MESH;  Surgeon: Coralie Keens, MD;  Location: [place];  Service: General;  Laterality: Bilateral;  . LUMBAR LAMINECTOMY/DECOMPRESSION MICRODISCECTOMY Left 04/10/2018   Procedure: Left Lumbar two-three Laminectomy/Foraminotomy;  Surgeon: Ashok Pall, MD;  Location: [place];  Service: Neurosurgery;  Laterality: Left;  . REVERSE SHOULDER ARTHROPLASTY Left 02/24/2019   Procedure: REVERSE TOTAL SHOULDER ARTHROPLASTY;  Surgeon: Hiram Gash, MD;  Location[facility];  Service: Orthopedics;  Laterality: Left;  . SHOULDER ARTHROSCOPY Right    x2  . VASECTOMY       History reviewed. No pertinent family history.   Social History   Socioeconomic History  . Marital status: Married      Spouse name: Not on file  . Number of children: Not on file  . Years of education: Not on file  . Highest education level: Not on file  Occupational History  . Not on file  Tobacco Use  . Smoking status: Current Every Day Smoker    Packs/day: 0.50  . Smokeless tobacco: Never Used  Substance and Sexual Activity  . Alcohol use: Yes    Comment: social  . Drug use: No  . Sexual activity: Not on file  Other Topics Concern  . Not on file  Social History Narrative  . Not on file   Social Determinants of Health   Financial Resource Strain:   . Difficulty of Paying Living Expenses: Not on file  Food Insecurity:   . Worried About Charity fundraiser in the Last Year: Not on file  . Ran Out of Food in the Last Year: Not on file  Transportation Needs:   . Lack of Transportation (Medical): Not on file  . Lack of Transportation (Non-Medical): Not on file  Physical Activity:   . Days of Exercise per Week: Not on file  . Minutes of Exercise per Session: Not on file  Stress:   . Feeling of Stress : Not on file  Social Connections:   . Frequency of Communication with Friends and Family: Not on file  . Frequency of Social Gatherings with Friends and Family: Not on file  . Attends Religious Services: Not on file  . Active Member of Clubs or Organizations: Not on file  . Attends Archivist Meetings: Not on file  . Marital Status: Not on file  Intimate Partner Violence:   . Fear of Current or Ex-Partner: Not on file  . Emotionally Abused: Not on file  . Physically Abused: Not on file  . Sexually Abused: Not on file     BP (!) 161/89   Pulse 98   Temp (!) 97.4 F (36.3 C)   Ht 5\' 7"  (1.702 m)   Wt 182 lb 3.2 oz (82.6 kg)   SpO2 98%   BMI 28.54 kg/m   Physical Exam:  Well appearing NAD HEENT: Unremarkable Neck:  No JVD, no thyromegally Lymphatics:  No adenopathy Back:  No CVA tenderness Lungs:  Clear with no wheezes HEART:  Regular rate rhythm, no murmurs, no  rubs, no clicks Abd:  soft, positive bowel sounds, no organomegally, no rebound, no guarding Ext:  2 plus pulses, no edema, no cyanosis, no clubbing Skin:  No rashes no nodules Neuro:  CN II through XII intact, motor grossly intact   DEVICE  Normal device function.  See PaceArt for details.   Assess/Plan: 1. CHB - he is asymptomatic, s/p PPM insertion. 2. PPM - his  medtronic DDD PM is working normally. We will recheck in several months. 3. PAF - he is not symptomatic and will continue systemic anti-coagulation.  Mikle Bosworth.D.

## 2019-09-09 DIAGNOSIS — Z23 Encounter for immunization: Secondary | ICD-10-CM | POA: Diagnosis not present

## 2019-09-10 DIAGNOSIS — M0579 Rheumatoid arthritis with rheumatoid factor of multiple sites without organ or systems involvement: Secondary | ICD-10-CM | POA: Diagnosis not present

## 2019-09-22 ENCOUNTER — Encounter: Payer: Medicare Other | Admitting: Internal Medicine

## 2019-09-23 ENCOUNTER — Ambulatory Visit (INDEPENDENT_AMBULATORY_CARE_PROVIDER_SITE_OTHER): Payer: Medicare Other | Admitting: *Deleted

## 2019-09-23 DIAGNOSIS — Z950 Presence of cardiac pacemaker: Secondary | ICD-10-CM | POA: Diagnosis not present

## 2019-09-23 LAB — CUP PACEART REMOTE DEVICE CHECK
Battery Impedance: 723 Ohm
Battery Remaining Longevity: 77 mo
Battery Voltage: 2.79 V
Brady Statistic AP VP Percent: 23 %
Brady Statistic AP VS Percent: 4 %
Brady Statistic AS VP Percent: 51 %
Brady Statistic AS VS Percent: 23 %
Date Time Interrogation Session: 20210224083241
Implantable Lead Implant Date: 20140421
Implantable Lead Implant Date: 20140421
Implantable Lead Location: 753859
Implantable Lead Location: 753860
Implantable Lead Model: 5076
Implantable Lead Model: 5592
Implantable Pulse Generator Implant Date: 20140421
Lead Channel Impedance Value: 537 Ohm
Lead Channel Impedance Value: 637 Ohm
Lead Channel Pacing Threshold Amplitude: 0.5 V
Lead Channel Pacing Threshold Amplitude: 0.625 V
Lead Channel Pacing Threshold Pulse Width: 0.4 ms
Lead Channel Pacing Threshold Pulse Width: 0.4 ms
Lead Channel Setting Pacing Amplitude: 1.5 V
Lead Channel Setting Pacing Amplitude: 2 V
Lead Channel Setting Pacing Pulse Width: 0.4 ms
Lead Channel Setting Sensing Sensitivity: 5.6 mV

## 2019-09-23 NOTE — Progress Notes (Signed)
PPM Remote  

## 2019-10-04 ENCOUNTER — Other Ambulatory Visit: Payer: Self-pay

## 2019-10-04 ENCOUNTER — Ambulatory Visit (INDEPENDENT_AMBULATORY_CARE_PROVIDER_SITE_OTHER): Payer: Medicare Other | Admitting: Family Medicine

## 2019-10-04 VITALS — BP 148/78 | HR 62 | Temp 97.8°F | Ht 66.0 in | Wt 187.4 lb

## 2019-10-04 DIAGNOSIS — I10 Essential (primary) hypertension: Secondary | ICD-10-CM

## 2019-10-04 DIAGNOSIS — G4700 Insomnia, unspecified: Secondary | ICD-10-CM

## 2019-10-04 DIAGNOSIS — F17200 Nicotine dependence, unspecified, uncomplicated: Secondary | ICD-10-CM

## 2019-10-04 DIAGNOSIS — E119 Type 2 diabetes mellitus without complications: Secondary | ICD-10-CM

## 2019-10-04 DIAGNOSIS — I480 Paroxysmal atrial fibrillation: Secondary | ICD-10-CM | POA: Diagnosis not present

## 2019-10-04 DIAGNOSIS — E785 Hyperlipidemia, unspecified: Secondary | ICD-10-CM

## 2019-10-04 DIAGNOSIS — K2100 Gastro-esophageal reflux disease with esophagitis, without bleeding: Secondary | ICD-10-CM

## 2019-10-04 DIAGNOSIS — Z950 Presence of cardiac pacemaker: Secondary | ICD-10-CM | POA: Diagnosis not present

## 2019-10-04 MED ORDER — LOSARTAN POTASSIUM-HCTZ 100-12.5 MG PO TABS: 1.0000 | ORAL_TABLET | Freq: Every day | ORAL | 5 refills | Status: DC

## 2019-10-04 MED ORDER — METFORMIN HCL ER 500 MG PO TB24: 500.0000 mg | ORAL_TABLET | Freq: Two times a day (BID) | ORAL | 5 refills | Status: DC

## 2019-10-04 NOTE — Progress Notes (Signed)
New Patient Office Visit  Subjective:  Patient ID: Adam Watts, male    DOB: Dec 01, 1950  Age: 69 y.o. MRN: [id number]  CC:  Chief Complaint  Patient presents with  . Medication Refill    Need refill on medication   DM/hyperlipidemia/HTN HPI Adam Watts presents for HTN/low K Cardiology-pacemaker placed RA-[facility]-injections Left shoulder replacement, back surgery in the past [facility] rehab-back pain [facility]-shoulder and back surgery DM-12/1-6.3%-metformin-fasting 105-120 hyperlipidemia-no medication HTN-diltiazem/Losartan/HCTZ-takes daily Pacemaker-Xarelto Hypokalemia-pt taking K daily otc Past Medical History:  Diagnosis Date  . A-fib ([place])   . Acute pharyngitis, unspecified   . Arthritis    RA all over  . Arthritis   . Chest pain   . Chronic back pain   . Dysrhythmia    a-fib  . Gastro-esophageal reflux disease with esophagitis   . GERD (gastroesophageal reflux disease)   . Hyperlipidemia, unspecified   . Hypertension   . Insomnia, unspecified   . Lumbar radiculopathy   . Nicotine dependence, unspecified, uncomplicated   . Pacemaker    CHB, PAF  . Pain in joint involving shoulder region   . Pain in left shoulder   . Pain in right leg   . Pneumonia   . Pre-diabetes   . Presence of cardiac pacemaker   . Rheumatoid arthritis, unspecified ([place])   . Smoking   . Type 2 diabetes mellitus without complications ([place])   . Unilateral inguinal hernia, without obstruction or gangrene, not specified as recurrent   . Unspecified atrial fibrillation [facility])     Past Surgical History:  Procedure Laterality Date  . CHOLECYSTECTOMY    . DENTAL SURGERY     implants  . INGUINAL HERNIA REPAIR Bilateral 04/24/2016   Procedure: LAPAROSCOPIC BILATERAL INGUINAL HERNIA REPAIR;  Surgeon: Coralie Keens, MD;  Location: [place];  Service: General;  Laterality: Bilateral;  . INSERTION OF MESH Bilateral 04/24/2016   Procedure:  INSERTION OF MESH;  Surgeon: Coralie Keens, MD;  Location: [place];  Service: General;  Laterality: Bilateral;  . LUMBAR LAMINECTOMY/DECOMPRESSION MICRODISCECTOMY Left 04/10/2018   Procedure: Left Lumbar two-three Laminectomy/Foraminotomy;  Surgeon: Ashok Pall, MD;  Location: [place];  Service: Neurosurgery;  Laterality: Left;  . REVERSE SHOULDER ARTHROPLASTY Left 02/24/2019   Procedure: REVERSE TOTAL SHOULDER ARTHROPLASTY;  Surgeon: Hiram Gash, MD;  [facility];  Service: Orthopedics;  Laterality: Left;  . SHOULDER ARTHROSCOPY Right    x2  . VASECTOMY      No family history on file.  Social History   Socioeconomic History  . Marital status: Married    Spouse name: Not on file  . Number of children: Not on file  . Years of education: Not on file  . Highest education level: Not on file  Occupational History  . Not on file  Tobacco Use  . Smoking status: Current Every Day Smoker    Packs/day: 0.50  . Smokeless tobacco: Never Used  Substance and Sexual Activity  . Alcohol use: Yes    Comment: social  . Drug use: No  . Sexual activity: Not on file  Other Topics Concern  . Not on file  Social History Narrative  . Not on file   Social Determinants of Health   Financial Resource Strain:   . Difficulty of Paying Living Expenses: Not on file  Food Insecurity:   . Worried About Charity fundraiser in the Last Year: Not on file  . Ran Out of Food  in the Last Year: Not on file  Transportation Needs:   . Lack of Transportation (Medical): Not on file  . Lack of Transportation (Non-Medical): Not on file  Physical Activity:   . Days of Exercise per Week: Not on file  . Minutes of Exercise per Session: Not on file  Stress:   . Feeling of Stress : Not on file  Social Connections:   . Frequency of Communication with Friends and Family: Not on file  . Frequency of Social Gatherings with Friends and Family: Not on file  . Attends Religious Services: Not on  file  . Active Member of Clubs or Organizations: Not on file  . Attends Archivist Meetings: Not on file  . Marital Status: Not on file  Intimate Partner Violence:   . Fear of Current or Ex-Partner: Not on file  . Emotionally Abused: Not on file  . Physically Abused: Not on file  . Sexually Abused: Not on file    ROS Review of Systems  Eyes:       Glasses-utd-exam 2019  Respiratory: Negative.   Cardiovascular:       Pacemaker  Gastrointestinal: Negative.   Endocrine:       DM  Musculoskeletal: Positive for arthralgias and back pain.  Allergic/Immunologic: Positive for environmental allergies.  Neurological: Negative.   Hematological: Negative.   Psychiatric/Behavioral: Negative.     Objective:   Today's Vitals: BP (!) 148/78 (BP Location: Right Arm, Patient Position: Sitting, Cuff Size: Normal)   Pulse 62   Temp 97.8 F (36.6 C) (Temporal)   Ht 5\' 6"  (1.676 m)   Wt 187 lb 6.4 oz (85 kg)   SpO2 97%   BMI 30.25 kg/m   Physical Exam Vitals reviewed.  Constitutional:      Appearance: Normal appearance. He is normal weight.  HENT:     Head: Normocephalic.  Cardiovascular:     Rate and Rhythm: Normal rate and regular rhythm.     Pulses: Normal pulses.     Heart sounds: Normal heart sounds.  Pulmonary:     Effort: Pulmonary effort is normal.     Breath sounds: Normal breath sounds.  Musculoskeletal:        General: Normal range of motion.     Cervical back: Normal range of motion.  Neurological:     Mental Status: He is alert and oriented to person, place, and time.  Psychiatric:        Mood and Affect: Mood normal.        Behavior: Behavior normal.     Assessment & Plan:  1. Hyperlipidemia, unspecified hyperlipidemia type No medications 2. Gastroesophageal reflux disease with esophagitis, unspecified whether hemorrhage prilosec-otc 3. Insomnia, unspecified type No medications 4. Type 2 diabetes mellitus without complication, unspecified  whether long term insulin use (HCC) meformin-daily since diagnosis Pt to schedule eye appt Foot exam completed 5. Nicotine dependence, uncomplicated, unspecified nicotine product type Encouraged pt to quit 6. Pacemaker Cardiology following 7. Paroxysmal atrial fibrillation [facility]) Cardiology following 8. Essential hypertension Losartan/Diltiazem-rx, bmp-low K-taking K otc-recheck bmp   Outpatient Encounter Medications as of 10/04/2019  Medication Sig  . CINNAMON PO Take 2,000 mg by mouth daily.   . Coenzyme Q10-Vitamin E (QUNOL ULTRA COQ10) 100-150 MG-UNIT CAPS Take 1 capsule by mouth daily.  Marland Kitchen diltiazem (TIAZAC) 360 MG 24 hr capsule Take 1 capsule (360 mg total) by mouth daily. Please keep upcoming appt for refills. Thank you  . golimumab (SIMPONI ARIA) 50 MG/4ML SOLN  injection Inject into the vein every 8 (eight) weeks.  Marland Kitchen loratadine (CLARITIN) 10 MG tablet Take 10 mg by mouth daily.  Marland Kitchen losartan-hydrochlorothiazide (HYZAAR) 100-12.5 MG per tablet Take 1 tablet by mouth daily.  . metFORMIN (GLUCOPHAGE-XR) 500 MG 24 hr tablet Take 500 mg by mouth 2 (two) times daily with a meal.  . Multiple Vitamins-Minerals (MEGA MULTIVITAMIN FOR MEN PO) Take 1 tablet by mouth daily.   Marland Kitchen omeprazole (PRILOSEC) 20 MG capsule Take 20 mg by mouth daily.  . vitamin B-12 (CYANOCOBALAMIN) 1000 MCG tablet Take 1,000 mcg by mouth daily.  Alveda Reasons 20 MG TABS tablet TAKE 1 TABLET BY MOUTH DAILY WITH SUPPER  . tadalafil (CIALIS) 20 MG tablet Take 20 mg by mouth as directed.  . [DISCONTINUED] Lidocaine-Menthol (ICY HOT LIDOCAINE PLUS MENTHOL) 4-1 % CREA Apply 1 application topically daily as needed (pain).   Facility-Administered Encounter Medications as of 10/04/2019  Medication  . ondansetron (ZOFRAN) 4 mg in sodium chloride 0.9 % 50 mL IVPB    Follow-up: pt to follow up with new primary care physician in 3-4 months  Marlene Pfluger Hannah Beat, MD

## 2019-10-04 NOTE — Patient Instructions (Addendum)
  Pt to schedule eye appointment labwork today-non fasting Pt to call if ortho referral desired     If you have lab work done today you will be contacted with your lab results within the next 2 weeks.  If you have not heard from [place] then please contact us. The fastest way to get your results is to register for My Chart.   IF you received an x-ray today, you will receive an invoice from [facility]. Please contact [facility] at [phone] with questions or concerns regarding your invoice.   IF you received labwork today, you will receive an invoice from [place]. Please contact [facility] at [phone] with questions or concerns regarding your invoice.   Our billing staff will not be able to assist you with questions regarding bills from these companies.  You will be contacted with the lab results as soon as they are available. The fastest way to get your results is to activate your My Chart account. Instructions are located on the last page of this paperwork. If you have not heard from [place] regarding the results in 2 weeks, please contact this office.

## 2019-10-05 LAB — BASIC METABOLIC PANEL
BUN: 9 mg/dL (ref 7–25)
CO2: 26 mmol/L (ref 20–32)
Calcium: 9.8 mg/dL (ref 8.6–10.3)
Chloride: 101 mmol/L (ref 98–110)
Creat: 0.96 mg/dL (ref 0.70–1.25)
Glucose, Bld: 109 mg/dL (ref 65–139)
Potassium: 4 mmol/L (ref 3.5–5.3)
Sodium: 140 mmol/L (ref 135–146)

## 2019-10-05 LAB — URINALYSIS
Bilirubin Urine: NEGATIVE
Glucose, UA: NEGATIVE
Hgb urine dipstick: NEGATIVE
Ketones, ur: NEGATIVE
Leukocytes,Ua: NEGATIVE
Nitrite: NEGATIVE
Protein, ur: NEGATIVE
Specific Gravity, Urine: 1.007 (ref 1.001–1.03)
pH: 6.5 (ref 5.0–8.0)

## 2019-10-05 NOTE — Progress Notes (Signed)
Pt notified of results, is scheduling eye exam today.

## 2019-10-07 DIAGNOSIS — Z23 Encounter for immunization: Secondary | ICD-10-CM | POA: Diagnosis not present

## 2019-11-05 DIAGNOSIS — M0579 Rheumatoid arthritis with rheumatoid factor of multiple sites without organ or systems involvement: Secondary | ICD-10-CM | POA: Diagnosis not present

## 2019-11-05 DIAGNOSIS — Z79899 Other long term (current) drug therapy: Secondary | ICD-10-CM | POA: Diagnosis not present

## 2019-11-08 ENCOUNTER — Other Ambulatory Visit: Payer: Self-pay | Admitting: Internal Medicine

## 2019-11-08 NOTE — Telephone Encounter (Signed)
Pt last saw Dr Lovena Le 08/25/19, last labs 10/04/19 Creat 0.96, age 69, weight 85kg, CrCl 86.08, based on CrCl pt is on appropriate dosage of Xarelto 20mg  QD.  Will refill rx.

## 2019-11-15 ENCOUNTER — Other Ambulatory Visit: Payer: Self-pay | Admitting: Internal Medicine

## 2019-11-15 DIAGNOSIS — Z79899 Other long term (current) drug therapy: Secondary | ICD-10-CM | POA: Diagnosis not present

## 2019-11-15 DIAGNOSIS — M5417 Radiculopathy, lumbosacral region: Secondary | ICD-10-CM | POA: Diagnosis not present

## 2019-11-15 DIAGNOSIS — Z6830 Body mass index (BMI) 30.0-30.9, adult: Secondary | ICD-10-CM | POA: Diagnosis not present

## 2019-11-15 DIAGNOSIS — M0579 Rheumatoid arthritis with rheumatoid factor of multiple sites without organ or systems involvement: Secondary | ICD-10-CM | POA: Diagnosis not present

## 2019-11-15 DIAGNOSIS — M2550 Pain in unspecified joint: Secondary | ICD-10-CM | POA: Diagnosis not present

## 2019-11-15 DIAGNOSIS — E669 Obesity, unspecified: Secondary | ICD-10-CM | POA: Diagnosis not present

## 2019-11-15 DIAGNOSIS — M150 Primary generalized (osteo)arthritis: Secondary | ICD-10-CM | POA: Diagnosis not present

## 2019-11-20 DIAGNOSIS — R1031 Right lower quadrant pain: Secondary | ICD-10-CM | POA: Diagnosis not present

## 2019-11-20 DIAGNOSIS — F17210 Nicotine dependence, cigarettes, uncomplicated: Secondary | ICD-10-CM | POA: Diagnosis not present

## 2019-11-20 DIAGNOSIS — I10 Essential (primary) hypertension: Secondary | ICD-10-CM | POA: Diagnosis not present

## 2019-11-20 DIAGNOSIS — E119 Type 2 diabetes mellitus without complications: Secondary | ICD-10-CM | POA: Diagnosis not present

## 2019-11-20 DIAGNOSIS — Z79899 Other long term (current) drug therapy: Secondary | ICD-10-CM | POA: Diagnosis not present

## 2019-11-20 DIAGNOSIS — R109 Unspecified abdominal pain: Secondary | ICD-10-CM | POA: Diagnosis not present

## 2019-11-20 DIAGNOSIS — E785 Hyperlipidemia, unspecified: Secondary | ICD-10-CM | POA: Diagnosis not present

## 2019-11-20 DIAGNOSIS — M5441 Lumbago with sciatica, right side: Secondary | ICD-10-CM | POA: Diagnosis not present

## 2019-11-20 DIAGNOSIS — K219 Gastro-esophageal reflux disease without esophagitis: Secondary | ICD-10-CM | POA: Diagnosis not present

## 2019-11-20 DIAGNOSIS — Z7901 Long term (current) use of anticoagulants: Secondary | ICD-10-CM | POA: Diagnosis not present

## 2019-11-20 DIAGNOSIS — N200 Calculus of kidney: Secondary | ICD-10-CM | POA: Diagnosis not present

## 2019-11-21 DIAGNOSIS — N200 Calculus of kidney: Secondary | ICD-10-CM | POA: Diagnosis not present

## 2019-11-21 DIAGNOSIS — R109 Unspecified abdominal pain: Secondary | ICD-10-CM | POA: Diagnosis not present

## 2019-12-10 ENCOUNTER — Telehealth: Payer: Self-pay | Admitting: Family Medicine

## 2019-12-23 ENCOUNTER — Ambulatory Visit (INDEPENDENT_AMBULATORY_CARE_PROVIDER_SITE_OTHER): Payer: Medicare Other | Admitting: *Deleted

## 2019-12-23 DIAGNOSIS — I442 Atrioventricular block, complete: Secondary | ICD-10-CM | POA: Diagnosis not present

## 2019-12-23 LAB — CUP PACEART REMOTE DEVICE CHECK
Battery Impedance: 748 Ohm
Battery Remaining Longevity: 74 mo
Battery Voltage: 2.79 V
Brady Statistic AP VP Percent: 22 %
Brady Statistic AP VS Percent: 3 %
Brady Statistic AS VP Percent: 47 %
Brady Statistic AS VS Percent: 28 %
Date Time Interrogation Session: 20210527071018
Implantable Lead Implant Date: 20140421
Implantable Lead Implant Date: 20140421
Implantable Lead Location: 753859
Implantable Lead Location: 753860
Implantable Lead Model: 5076
Implantable Lead Model: 5592
Implantable Pulse Generator Implant Date: 20140421
Lead Channel Impedance Value: 500 Ohm
Lead Channel Impedance Value: 523 Ohm
Lead Channel Pacing Threshold Amplitude: 0.5 V
Lead Channel Pacing Threshold Amplitude: 0.5 V
Lead Channel Pacing Threshold Pulse Width: 0.4 ms
Lead Channel Pacing Threshold Pulse Width: 0.4 ms
Lead Channel Setting Pacing Amplitude: 1.5 V
Lead Channel Setting Pacing Amplitude: 2 V
Lead Channel Setting Pacing Pulse Width: 0.4 ms
Lead Channel Setting Sensing Sensitivity: 4 mV

## 2019-12-24 NOTE — Progress Notes (Signed)
Remote pacemaker transmission.   

## 2020-01-07 DIAGNOSIS — M0579 Rheumatoid arthritis with rheumatoid factor of multiple sites without organ or systems involvement: Secondary | ICD-10-CM | POA: Diagnosis not present

## 2020-01-19 ENCOUNTER — Encounter: Payer: Self-pay | Admitting: Family Medicine

## 2020-01-19 ENCOUNTER — Ambulatory Visit (INDEPENDENT_AMBULATORY_CARE_PROVIDER_SITE_OTHER): Payer: Medicare Other | Admitting: Family Medicine

## 2020-01-19 ENCOUNTER — Other Ambulatory Visit: Payer: Self-pay

## 2020-01-19 VITALS — BP 138/82 | HR 66 | Temp 97.2°F | Ht 66.0 in | Wt 185.6 lb

## 2020-01-19 DIAGNOSIS — Z950 Presence of cardiac pacemaker: Secondary | ICD-10-CM

## 2020-01-19 DIAGNOSIS — M5442 Lumbago with sciatica, left side: Secondary | ICD-10-CM

## 2020-01-19 DIAGNOSIS — I10 Essential (primary) hypertension: Secondary | ICD-10-CM

## 2020-01-19 DIAGNOSIS — G4700 Insomnia, unspecified: Secondary | ICD-10-CM

## 2020-01-19 DIAGNOSIS — E785 Hyperlipidemia, unspecified: Secondary | ICD-10-CM

## 2020-01-19 DIAGNOSIS — G8929 Other chronic pain: Secondary | ICD-10-CM

## 2020-01-19 DIAGNOSIS — E119 Type 2 diabetes mellitus without complications: Secondary | ICD-10-CM

## 2020-01-19 DIAGNOSIS — I480 Paroxysmal atrial fibrillation: Secondary | ICD-10-CM | POA: Diagnosis not present

## 2020-01-19 DIAGNOSIS — K2100 Gastro-esophageal reflux disease with esophagitis, without bleeding: Secondary | ICD-10-CM

## 2020-01-19 LAB — BAYER DCA HB A1C WAIVED: HB A1C (BAYER DCA - WAIVED): 7 % — ABNORMAL HIGH (ref <7.0)

## 2020-01-19 MED ORDER — METFORMIN HCL ER 500 MG PO TB24: 500.0000 mg | ORAL_TABLET | Freq: Two times a day (BID) | ORAL | 1 refills | Status: DC

## 2020-01-19 MED ORDER — MIRTAZAPINE 15 MG PO TABS: 15.0000 mg | ORAL_TABLET | Freq: Every day | ORAL | 1 refills | Status: DC

## 2020-01-19 MED ORDER — HYDROCHLOROTHIAZIDE 12.5 MG PO CAPS: 12.5000 mg | ORAL_CAPSULE | Freq: Every day | ORAL | 1 refills | Status: DC

## 2020-01-19 MED ORDER — DILTIAZEM HCL ER BEADS 360 MG PO CP24: ORAL_CAPSULE | ORAL | 1 refills | Status: DC

## 2020-01-19 MED ORDER — RIVAROXABAN 20 MG PO TABS: ORAL_TABLET | ORAL | 1 refills | Status: DC

## 2020-01-19 NOTE — Progress Notes (Signed)
New Patient Office Visit  Assessment & Plan:  1. Insomnia, unspecified type - Uncontrolled. Remeron increased from 7.5 mg to 15 mg QHS.  - mirtazapine (REMERON) 15 MG tablet; Take 1 tablet (15 mg total) by mouth at bedtime.  Dispense: 90 tablet; Refill: 1 - CMP14+EGFR  2. Chronic midline low back pain with left-sided sciatica - Patient not interested in more surgeries or pain medication. He is going to make an appointment at [facility] to see if he is a candidate for stem cell injections.   3. Essential hypertension - Well controlled on current regimen.  - diltiazem (TIAZAC) 360 MG 24 hr capsule; TAKE 1 CAPSULE BY MOUTH EVERY DAY  Dispense: 90 capsule; Refill: 1 - hydrochlorothiazide (MICROZIDE) 12.5 MG capsule; Take 1 capsule (12.5 mg total) by mouth daily.  Dispense: 90 capsule; Refill: 1 - CMP14+EGFR - Lipid panel  4. Paroxysmal atrial fibrillation (HCC) - Well controlled on current regimen. Managed by cardiology. - rivaroxaban (XARELTO) 20 MG TABS tablet; TAKE 1 TABLET BY MOUTH DAILY WITH SUPPER  Dispense: 90 tablet; Refill: 1 - CMP14+EGFR  5. Presence of cardiac pacemaker - Managed by cardiology.   6. Gastroesophageal reflux disease with esophagitis, unspecified whether hemorrhage - Well controlled on current regimen.  - CMP14+EGFR  7. Type 2 diabetes mellitus without complication, unspecified whether long term insulin use (HCC) Lab Results  Component Value Date   HGBA1C 7.0 (H) 01/19/2020   HGBA1C 6.3 06/29/2019   HGBA1C 6.1 (H) 02/15/2019  - Diabetes is at goal of A1c 7. - Medications: continue current medications - Patient is not currently taking a statin. Patient is taking an ACE-inhibitor/ARB.  - Last foot exam: 10/01/2019 - Bayer DCA Hb A1c Waived - metFORMIN (GLUCOPHAGE-XR) 500 MG 24 hr tablet; Take 1 tablet (500 mg total) by mouth 2 (two) times daily with a meal.  Dispense: 180 tablet; Refill: 1 - CMP14+EGFR - Lipid panel  8. Hyperlipidemia, unspecified  hyperlipidemia type - Well controlled on current regimen.  - CMP14+EGFR - Lipid panel   Follow-up: Return in about 4 weeks (around 02/16/2020) for Insomnia (telephone).  Patient will be leaving for the beach and will be gone until the end of summer.   [name], MSN, APRN, FNP-C [facility]  Subjective:  Patient ID: Adam Watts, male    DOB: Aug 04, 1950  Age: 69 y.o. MRN: [id number]  Patient Care Team: Loman Brooklyn, FNP as PCP - General (Family Medicine) Evans Lance, MD as PCP - Cardiology (Cardiology) Josue Hector, MD as Consulting Physician (Cardiology) Evans Lance, MD as Consulting Physician (Cardiology)  CC:  Chief Complaint  Patient presents with  . New Patient (Initial Visit)    Dr. Daryel November  . Establish Care  . trouble sleeping    Patient states he is on Mirtazapine and it does not help.  . Back Pain    Patient states it has been ongoing.    HPI Adam Watts presents to establish care.  Patient is concerned about trouble sleeping.  He is on mirtazapine 7.5 mg at bedtime.  His other concern is about chronic back pain with left-sided sciatica.  Patient reports multiple back surgeries with pins and screws.  He has done physical therapy and had injections which were not effective.  He reports he can only stand for about 5 minutes at a time or walk short distances before it starts hurting him too bad.  The pain is constant and he wonders if it  may have something to do with why he cannot sleep very well.  He does use a TENS unit twice daily.  His wife also gives him massages with tennis balls twice daily.  Although his pain significantly limits what he can do he is tired of "being cut on"and is not interested in pain medications.  He has a friend that had stem cell injections in his knees and wonders if this may be an option for his back.  Diabetes: Patient presents for follow up of diabetes.  Known diabetic complications:  cardiovascular disease. Medication compliance: yes. Is he  on ACE inhibitor or angiotensin II receptor blocker? Yes. Is he on a statin? No.   Lab Results  Component Value Date   HGBA1C 7.0 (H) 01/19/2020   HGBA1C 6.3 06/29/2019   HGBA1C 6.1 (H) 02/15/2019   Lab Results  Component Value Date   MICROALBUR 7.9 12/16/2014   [place] 95 01/19/2020   CREATININE 0.97 01/19/2020     Review of Systems  Constitutional: Negative for chills, fever, malaise/fatigue and weight loss.  HENT: Negative for congestion, ear discharge, ear pain, nosebleeds, sinus pain, sore throat and tinnitus.   Eyes: Negative for blurred vision, double vision, pain, discharge and redness.  Respiratory: Negative for cough, shortness of breath and wheezing.   Cardiovascular: Negative for chest pain, palpitations and leg swelling.  Gastrointestinal: Negative for abdominal pain, constipation, diarrhea, heartburn, nausea and vomiting.  Genitourinary: Negative for dysuria, frequency and urgency.  Musculoskeletal: Positive for back pain and joint pain. Negative for myalgias.  Skin: Negative for rash.  Neurological: Negative for dizziness, seizures, weakness and headaches.  Psychiatric/Behavioral: Negative for depression, substance abuse and suicidal ideas. The patient has insomnia. The patient is not nervous/anxious.     Current Outpatient Medications:  .  CINNAMON PO, Take 2,000 mg by mouth daily. , Disp: , Rfl:  .  diltiazem (TIAZAC) 360 MG 24 hr capsule, TAKE 1 CAPSULE BY MOUTH EVERY DAY, Disp: 90 capsule, Rfl: 1 .  golimumab (SIMPONI ARIA) 50 MG/4ML SOLN injection, Inject into the vein every 8 (eight) weeks., Disp: , Rfl:  .  hydrochlorothiazide (MICROZIDE) 12.5 MG capsule, Take 12.5 mg by mouth daily., Disp: , Rfl:  .  loratadine (CLARITIN) 10 MG tablet, Take 10 mg by mouth daily., Disp: , Rfl:  .  losartan (COZAAR) 100 MG tablet, Take 100 mg by mouth daily., Disp: , Rfl:  .  metFORMIN (GLUCOPHAGE-XR) 500 MG 24 hr  tablet, Take 1 tablet (500 mg total) by mouth 2 (two) times daily with a meal., Disp: 180 tablet, Rfl: 5 .  mirtazapine (REMERON) 15 MG tablet, Take 15 mg by mouth at bedtime., Disp: , Rfl:  .  Multiple Vitamins-Minerals (MEGA MULTIVITAMIN FOR MEN PO), Take 1 tablet by mouth daily. , Disp: , Rfl:  .  omeprazole (PRILOSEC) 20 MG capsule, Take 20 mg by mouth daily., Disp: , Rfl:  .  XARELTO 20 MG TABS tablet, TAKE 1 TABLET BY MOUTH DAILY WITH SUPPER, Disp: 90 tablet, Rfl: 1 No current facility-administered medications for this visit.  Facility-Administered Medications Ordered in Other Visits:  .  ondansetron (ZOFRAN) 4 mg in sodium chloride 0.9 % 50 mL IVPB, 4 mg, Intravenous, Q6H PRN, Ashok Pall, MD  No Known Allergies  Past Medical History:  Diagnosis Date  . A-fib ([place])   . Chest pain   . Chronic back pain   . Dysrhythmia    a-fib  . Fracture of lateral malleolus 02/11/2015  . Gastro-esophageal reflux  disease with esophagitis   . Hyperlipidemia, unspecified   . Hypertension   . Insomnia, unspecified   . Lumbar radiculopathy   . Nicotine dependence, unspecified, uncomplicated   . Pacemaker    CHB, PAF  . Pain in left shoulder   . Pain in right leg   . Pneumonia   . Pre-diabetes   . Presence of cardiac pacemaker   . Rheumatoid arthritis, unspecified ([place])   . Type 2 diabetes mellitus without complications ([place])   . Unilateral inguinal hernia, without obstruction or gangrene, not specified as recurrent     Past Surgical History:  Procedure Laterality Date  . CHOLECYSTECTOMY    . DENTAL SURGERY     implants  . INGUINAL HERNIA REPAIR Bilateral 04/24/2016   Procedure: LAPAROSCOPIC BILATERAL INGUINAL HERNIA REPAIR;  Surgeon: Coralie Keens, MD;  Location: [facility];  Service: General;  Laterality: Bilateral;  . INSERTION OF MESH Bilateral 04/24/2016   Procedure: INSERTION OF MESH;  Surgeon: Coralie Keens, MD;  Location: [facility];  Service:  General;  Laterality: Bilateral;  . LUMBAR LAMINECTOMY/DECOMPRESSION MICRODISCECTOMY Left 04/10/2018   Procedure: Left Lumbar two-three Laminectomy/Foraminotomy;  Surgeon: Ashok Pall, MD;  Location: [facility];  Service: Neurosurgery;  Laterality: Left;  . REVERSE SHOULDER ARTHROPLASTY Left 02/24/2019   Procedure: REVERSE TOTAL SHOULDER ARTHROPLASTY;  Surgeon: Hiram Gash, MD;  Location: [facility];  Service: Orthopedics;  Laterality: Left;  . SHOULDER ARTHROSCOPY Right    x2  . VASECTOMY      Family History  Problem Relation Age of Onset  . Hypertension Mother   . Hypertension Father     Social History   Socioeconomic History  . Marital status: Married    Spouse name: Not on file  . Number of children: Not on file  . Years of education: Not on file  . Highest education level: Not on file  Occupational History  . Not on file  Tobacco Use  . Smoking status: Current Every Day Smoker    Packs/day: 1.00  . Smokeless tobacco: Never Used  Vaping Use  . Vaping Use: Never used  Substance and Sexual Activity  . Alcohol use: Yes    Comment: social  . Drug use: No  . Sexual activity: Yes    Birth control/protection: None  Other Topics Concern  . Not on file  Social History Narrative  . Not on file   Social Determinants of Health   Financial Resource Strain:   . Difficulty of Paying Living Expenses:   Food Insecurity:   . Worried About Charity fundraiser in the Last Year:   . Arboriculturist in the Last Year:   Transportation Needs:   . Film/video editor (Medical):   Marland Kitchen Lack of Transportation (Non-Medical):   Physical Activity:   . Days of Exercise per Week:   . Minutes of Exercise per Session:   Stress:   . Feeling of Stress :   Social Connections:   . Frequency of Communication with Friends and Family:   . Frequency of Social Gatherings with Friends and Family:   . Attends Religious Services:   . Active Member of Clubs or Organizations:   . Attends Theatre manager Meetings:   Marland Kitchen Marital Status:   Intimate Partner Violence:   . Fear of Current or Ex-Partner:   . Emotionally Abused:   Marland Kitchen Physically Abused:   . Sexually Abused:     Objective:   Today's Vitals: BP  138/82   Pulse 66   Temp (!) 97.2 F (36.2 C) (Temporal)   Ht 5' 6"  (1.676 m)   Wt 185 lb 9.6 oz (84.2 kg)   SpO2 99%   BMI 29.96 kg/m   Physical Exam Vitals reviewed.  Constitutional:      General: He is not in acute distress.    Appearance: Normal appearance. He is overweight. He is not ill-appearing, toxic-appearing or diaphoretic.  HENT:     Head: Normocephalic and atraumatic.  Eyes:     General: No scleral icterus.       Right eye: No discharge.        Left eye: No discharge.     Conjunctiva/sclera: Conjunctivae normal.  Cardiovascular:     Rate and Rhythm: Normal rate and regular rhythm.     Heart sounds: Normal heart sounds. No murmur heard.  No friction rub. No gallop.   Pulmonary:     Effort: Pulmonary effort is normal. No respiratory distress.     Breath sounds: Normal breath sounds. No stridor. No wheezing, rhonchi or rales.  Musculoskeletal:        General: Normal range of motion.     Cervical back: Normal range of motion.  Skin:    General: Skin is warm and dry.  Neurological:     Mental Status: He is alert and oriented to person, place, and time. Mental status is at baseline.  Psychiatric:        Mood and Affect: Mood normal.        Behavior: Behavior normal.        Thought Content: Thought content normal.        Judgment: Judgment normal.

## 2020-01-19 NOTE — Patient Instructions (Signed)
[facility] [phone]

## 2020-01-20 LAB — LIPID PANEL
Chol/HDL Ratio: 3.4 ratio (ref 0.0–5.0)
Cholesterol, Total: 176 mg/dL (ref 100–199)
HDL: 52 mg/dL (ref >39)
LDL Chol Calc (NIH): 95 mg/dL (ref 0–99)
Triglycerides: 169 mg/dL — ABNORMAL HIGH (ref 0–149)
VLDL Cholesterol Cal: 29 mg/dL (ref 5–40)

## 2020-01-20 LAB — CMP14+EGFR
ALT: 12 IU/L (ref 0–44)
AST: 24 IU/L (ref 0–40)
Albumin/Globulin Ratio: 1.6 (ref 1.2–2.2)
Albumin: 4.2 g/dL (ref 3.8–4.8)
Alkaline Phosphatase: 92 IU/L (ref 48–121)
BUN/Creatinine Ratio: 8 — ABNORMAL LOW (ref 10–24)
BUN: 8 mg/dL (ref 8–27)
Bilirubin Total: 0.3 mg/dL (ref 0.0–1.2)
CO2: 26 mmol/L (ref 20–29)
Calcium: 9.1 mg/dL (ref 8.6–10.2)
Chloride: 99 mmol/L (ref 96–106)
Creatinine, Ser: 0.97 mg/dL (ref 0.76–1.27)
GFR calc Af Amer: 91 mL/min/{1.73_m2} (ref >59)
GFR calc non Af Amer: 79 mL/min/{1.73_m2} (ref >59)
Globulin, Total: 2.7 g/dL (ref 1.5–4.5)
Glucose: 96 mg/dL (ref 65–99)
Potassium: 3.3 mmol/L — ABNORMAL LOW (ref 3.5–5.2)
Sodium: 141 mmol/L (ref 134–144)
Total Protein: 6.9 g/dL (ref 6.0–8.5)

## 2020-01-22 ENCOUNTER — Encounter: Payer: Self-pay | Admitting: Family Medicine

## 2020-01-22 DIAGNOSIS — M5442 Lumbago with sciatica, left side: Secondary | ICD-10-CM | POA: Insufficient documentation

## 2020-02-10 ENCOUNTER — Telehealth: Payer: Self-pay | Admitting: Family Medicine

## 2020-02-10 DIAGNOSIS — M5442 Lumbago with sciatica, left side: Secondary | ICD-10-CM

## 2020-02-10 NOTE — Telephone Encounter (Signed)
  REFERRAL REQUEST Telephone Note 02/10/2020  What type of referral do you need? Ref for Dry Needling   Why do you need this referral? Sciatic Nerve issues in hip and lower back  Have you been seen at our office for this problem? yes (Advise that they may need an appointment with their PCP before a referral can be done)  Is there a particular doctor or location that you prefer? [place] [address] Phone [phone] Fax : [phone]  Patient notified that referrals can take up to a week or longer to process. If they haven't heard anything within a week they should call back and speak with the referral department.

## 2020-02-10 NOTE — Telephone Encounter (Signed)
Referral placed.

## 2020-02-15 NOTE — Telephone Encounter (Signed)
Pt calling to on referral for PT

## 2020-02-15 NOTE — Telephone Encounter (Signed)
Patient aware and verbalizes understanding. 

## 2020-02-16 ENCOUNTER — Ambulatory Visit (INDEPENDENT_AMBULATORY_CARE_PROVIDER_SITE_OTHER): Payer: Medicare Other | Admitting: Family Medicine

## 2020-02-16 DIAGNOSIS — G4700 Insomnia, unspecified: Secondary | ICD-10-CM | POA: Diagnosis not present

## 2020-02-16 NOTE — Progress Notes (Signed)
Virtual Visit via Telephone Note  I connected with Adam Watts on 02/16/20 at 10:11 AM by telephone and verified that I am speaking with the correct person using two identifiers. [place] and nobody is currently with him during this visit. The provider, Loman Brooklyn, FNP is located in their office at time of visit.  I discussed the limitations, risks, security and privacy concerns of performing an evaluation and management service by telephone and the availability of in person appointments. I also discussed with the patient that there may be a patient responsible charge related to this service. The patient expressed understanding and agreed to proceed.  Subjective: PCP: Loman Brooklyn, FNP  Chief Complaint  Patient presents with  . Insomnia   Approximately 4 weeks ago Remeron was increased from 7.5 mg to 15 mg to try to help with sleep.  Patient feels it may have helped a little bit but ultimately he feels like the reason he is waking up so much during the night is because of his back pain.  He would like to continue the current dosage of Remeron for now while he is addressing his back.  He is about to start dry needling to see if this will help with the pain.   ROS: Per HPI  Current Outpatient Medications:  .  CINNAMON PO, Take 2,000 mg by mouth daily. , Disp: , Rfl:  .  diltiazem (TIAZAC) 360 MG 24 hr capsule, TAKE 1 CAPSULE BY MOUTH EVERY DAY, Disp: 90 capsule, Rfl: 1 .  golimumab (SIMPONI ARIA) 50 MG/4ML SOLN injection, Inject into the vein every 8 (eight) weeks., Disp: , Rfl:  .  hydrochlorothiazide (MICROZIDE) 12.5 MG capsule, Take 1 capsule (12.5 mg total) by mouth daily., Disp: 90 capsule, Rfl: 1 .  loratadine (CLARITIN) 10 MG tablet, Take 10 mg by mouth daily., Disp: , Rfl:  .  losartan (COZAAR) 100 MG tablet, Take 100 mg by mouth daily., Disp: , Rfl:  .  metFORMIN (GLUCOPHAGE-XR) 500 MG 24 hr tablet, Take 1 tablet  (500 mg total) by mouth 2 (two) times daily with a meal., Disp: 180 tablet, Rfl: 1 .  mirtazapine (REMERON) 15 MG tablet, Take 1 tablet (15 mg total) by mouth at bedtime., Disp: 90 tablet, Rfl: 1 .  Multiple Vitamins-Minerals (MEGA MULTIVITAMIN FOR MEN PO), Take 1 tablet by mouth daily. , Disp: , Rfl:  .  omeprazole (PRILOSEC) 20 MG capsule, Take 20 mg by mouth daily., Disp: , Rfl:  .  rivaroxaban (XARELTO) 20 MG TABS tablet, TAKE 1 TABLET BY MOUTH DAILY WITH SUPPER, Disp: 90 tablet, Rfl: 1 No current facility-administered medications for this visit.  Facility-Administered Medications Ordered in Other Visits:  .  ondansetron (ZOFRAN) 4 mg in sodium chloride 0.9 % 50 mL IVPB, 4 mg, Intravenous, Q6H PRN, Ashok Pall, MD  No Known Allergies Past Medical History:  Diagnosis Date  . A-fib ([place])   . Chest pain   . Chronic back pain   . Dysrhythmia    a-fib  . Fracture of lateral malleolus 02/11/2015  . Gastro-esophageal reflux disease with esophagitis   . Hyperlipidemia, unspecified   . Hypertension   . Insomnia, unspecified   . Lumbar radiculopathy   . Nicotine dependence, unspecified, uncomplicated   . Pacemaker    CHB, PAF  . Pain in left shoulder   . Pain in right leg   . Pneumonia   . Pre-diabetes   .  Presence of cardiac pacemaker   . Rheumatoid arthritis, unspecified ([place])   . Type 2 diabetes mellitus without complications ([place])   . Unilateral inguinal hernia, without obstruction or gangrene, not specified as recurrent     Observations/Objective: A&O  No respiratory distress or wheezing audible over the phone Mood, judgement, and thought processes all WNL   Assessment and Plan: 1. Insomnia, unspecified type - Continue current Remeron dosage.   Follow Up Instructions:  I discussed the assessment and treatment plan with the patient. The patient was provided an opportunity to ask questions and all were answered. The patient agreed with the plan and demonstrated an  understanding of the instructions.   The patient was advised to call back or seek an in-person evaluation if the symptoms worsen or if the condition fails to improve as anticipated.  The above assessment and management plan was discussed with the patient. The patient verbalized understanding of and has agreed to the management plan. Patient is aware to call the clinic if symptoms persist or worsen. Patient is aware when to return to the clinic for a follow-up visit. Patient educated on when it is appropriate to go to the emergency department.   Time call ended: 10:15 AM  I provided 6 minutes of non-face-to-face time during this encounter.  [name], MSN, APRN, FNP-C [facility] 02/16/20

## 2020-02-17 DIAGNOSIS — M545 Low back pain: Secondary | ICD-10-CM | POA: Diagnosis not present

## 2020-02-17 DIAGNOSIS — M5432 Sciatica, left side: Secondary | ICD-10-CM | POA: Diagnosis not present

## 2020-02-17 DIAGNOSIS — R2689 Other abnormalities of gait and mobility: Secondary | ICD-10-CM | POA: Diagnosis not present

## 2020-02-17 DIAGNOSIS — R531 Weakness: Secondary | ICD-10-CM | POA: Diagnosis not present

## 2020-02-18 DIAGNOSIS — R2689 Other abnormalities of gait and mobility: Secondary | ICD-10-CM | POA: Diagnosis not present

## 2020-02-18 DIAGNOSIS — M5432 Sciatica, left side: Secondary | ICD-10-CM | POA: Diagnosis not present

## 2020-02-18 DIAGNOSIS — M545 Low back pain: Secondary | ICD-10-CM | POA: Diagnosis not present

## 2020-02-18 DIAGNOSIS — R531 Weakness: Secondary | ICD-10-CM | POA: Diagnosis not present

## 2020-02-19 ENCOUNTER — Encounter: Payer: Self-pay | Admitting: Family Medicine

## 2020-02-23 DIAGNOSIS — K5731 Diverticulosis of large intestine without perforation or abscess with bleeding: Secondary | ICD-10-CM | POA: Diagnosis not present

## 2020-02-23 DIAGNOSIS — J440 Chronic obstructive pulmonary disease with acute lower respiratory infection: Secondary | ICD-10-CM | POA: Diagnosis not present

## 2020-02-23 DIAGNOSIS — M25511 Pain in right shoulder: Secondary | ICD-10-CM | POA: Diagnosis not present

## 2020-02-23 DIAGNOSIS — K219 Gastro-esophageal reflux disease without esophagitis: Secondary | ICD-10-CM | POA: Diagnosis not present

## 2020-02-23 DIAGNOSIS — J690 Pneumonitis due to inhalation of food and vomit: Secondary | ICD-10-CM | POA: Diagnosis not present

## 2020-02-23 DIAGNOSIS — R079 Chest pain, unspecified: Secondary | ICD-10-CM | POA: Diagnosis not present

## 2020-02-23 DIAGNOSIS — J189 Pneumonia, unspecified organism: Secondary | ICD-10-CM | POA: Diagnosis not present

## 2020-02-23 DIAGNOSIS — R7989 Other specified abnormal findings of blood chemistry: Secondary | ICD-10-CM | POA: Diagnosis not present

## 2020-02-23 DIAGNOSIS — J441 Chronic obstructive pulmonary disease with (acute) exacerbation: Secondary | ICD-10-CM | POA: Diagnosis not present

## 2020-02-23 DIAGNOSIS — J181 Lobar pneumonia, unspecified organism: Secondary | ICD-10-CM | POA: Diagnosis not present

## 2020-02-23 DIAGNOSIS — A419 Sepsis, unspecified organism: Secondary | ICD-10-CM | POA: Diagnosis not present

## 2020-02-23 DIAGNOSIS — M069 Rheumatoid arthritis, unspecified: Secondary | ICD-10-CM | POA: Diagnosis not present

## 2020-02-23 DIAGNOSIS — N179 Acute kidney failure, unspecified: Secondary | ICD-10-CM | POA: Diagnosis not present

## 2020-02-23 DIAGNOSIS — M06041 Rheumatoid arthritis without rheumatoid factor, right hand: Secondary | ICD-10-CM | POA: Diagnosis not present

## 2020-02-23 DIAGNOSIS — R591 Generalized enlarged lymph nodes: Secondary | ICD-10-CM | POA: Diagnosis not present

## 2020-02-23 DIAGNOSIS — R05 Cough: Secondary | ICD-10-CM | POA: Diagnosis not present

## 2020-02-23 DIAGNOSIS — I2510 Atherosclerotic heart disease of native coronary artery without angina pectoris: Secondary | ICD-10-CM | POA: Diagnosis not present

## 2020-02-23 DIAGNOSIS — M06042 Rheumatoid arthritis without rheumatoid factor, left hand: Secondary | ICD-10-CM | POA: Diagnosis not present

## 2020-02-23 DIAGNOSIS — Z20822 Contact with and (suspected) exposure to covid-19: Secondary | ICD-10-CM | POA: Diagnosis not present

## 2020-02-23 DIAGNOSIS — R918 Other nonspecific abnormal finding of lung field: Secondary | ICD-10-CM | POA: Diagnosis not present

## 2020-02-23 DIAGNOSIS — I480 Paroxysmal atrial fibrillation: Secondary | ICD-10-CM | POA: Diagnosis not present

## 2020-02-23 DIAGNOSIS — I10 Essential (primary) hypertension: Secondary | ICD-10-CM | POA: Diagnosis not present

## 2020-02-23 DIAGNOSIS — J9601 Acute respiratory failure with hypoxia: Secondary | ICD-10-CM | POA: Diagnosis not present

## 2020-02-24 DIAGNOSIS — I2510 Atherosclerotic heart disease of native coronary artery without angina pectoris: Secondary | ICD-10-CM | POA: Diagnosis present

## 2020-02-24 DIAGNOSIS — K5731 Diverticulosis of large intestine without perforation or abscess with bleeding: Secondary | ICD-10-CM | POA: Diagnosis present

## 2020-02-24 DIAGNOSIS — D500 Iron deficiency anemia secondary to blood loss (chronic): Secondary | ICD-10-CM | POA: Diagnosis present

## 2020-02-24 DIAGNOSIS — I5033 Acute on chronic diastolic (congestive) heart failure: Secondary | ICD-10-CM | POA: Diagnosis not present

## 2020-02-24 DIAGNOSIS — K5900 Constipation, unspecified: Secondary | ICD-10-CM | POA: Diagnosis present

## 2020-02-24 DIAGNOSIS — M06042 Rheumatoid arthritis without rheumatoid factor, left hand: Secondary | ICD-10-CM | POA: Diagnosis not present

## 2020-02-24 DIAGNOSIS — E1143 Type 2 diabetes mellitus with diabetic autonomic (poly)neuropathy: Secondary | ICD-10-CM | POA: Diagnosis present

## 2020-02-24 DIAGNOSIS — K219 Gastro-esophageal reflux disease without esophagitis: Secondary | ICD-10-CM | POA: Diagnosis present

## 2020-02-24 DIAGNOSIS — R918 Other nonspecific abnormal finding of lung field: Secondary | ICD-10-CM | POA: Diagnosis not present

## 2020-02-24 DIAGNOSIS — J440 Chronic obstructive pulmonary disease with acute lower respiratory infection: Secondary | ICD-10-CM | POA: Diagnosis present

## 2020-02-24 DIAGNOSIS — N179 Acute kidney failure, unspecified: Secondary | ICD-10-CM | POA: Diagnosis not present

## 2020-02-24 DIAGNOSIS — J9601 Acute respiratory failure with hypoxia: Secondary | ICD-10-CM | POA: Diagnosis present

## 2020-02-24 DIAGNOSIS — K3184 Gastroparesis: Secondary | ICD-10-CM | POA: Diagnosis present

## 2020-02-24 DIAGNOSIS — Z7984 Long term (current) use of oral hypoglycemic drugs: Secondary | ICD-10-CM | POA: Diagnosis not present

## 2020-02-24 DIAGNOSIS — M069 Rheumatoid arthritis, unspecified: Secondary | ICD-10-CM | POA: Diagnosis present

## 2020-02-24 DIAGNOSIS — Z79899 Other long term (current) drug therapy: Secondary | ICD-10-CM | POA: Diagnosis not present

## 2020-02-24 DIAGNOSIS — Z20822 Contact with and (suspected) exposure to covid-19: Secondary | ICD-10-CM | POA: Diagnosis present

## 2020-02-24 DIAGNOSIS — Z7901 Long term (current) use of anticoagulants: Secondary | ICD-10-CM | POA: Diagnosis not present

## 2020-02-24 DIAGNOSIS — E119 Type 2 diabetes mellitus without complications: Secondary | ICD-10-CM | POA: Diagnosis not present

## 2020-02-24 DIAGNOSIS — Z87891 Personal history of nicotine dependence: Secondary | ICD-10-CM | POA: Diagnosis not present

## 2020-02-24 DIAGNOSIS — J449 Chronic obstructive pulmonary disease, unspecified: Secondary | ICD-10-CM | POA: Diagnosis not present

## 2020-02-24 DIAGNOSIS — J690 Pneumonitis due to inhalation of food and vomit: Secondary | ICD-10-CM | POA: Diagnosis not present

## 2020-02-24 DIAGNOSIS — J441 Chronic obstructive pulmonary disease with (acute) exacerbation: Secondary | ICD-10-CM | POA: Diagnosis present

## 2020-02-24 DIAGNOSIS — D509 Iron deficiency anemia, unspecified: Secondary | ICD-10-CM | POA: Diagnosis not present

## 2020-02-24 DIAGNOSIS — R0602 Shortness of breath: Secondary | ICD-10-CM | POA: Diagnosis not present

## 2020-02-24 DIAGNOSIS — J189 Pneumonia, unspecified organism: Secondary | ICD-10-CM | POA: Diagnosis present

## 2020-02-24 DIAGNOSIS — Z950 Presence of cardiac pacemaker: Secondary | ICD-10-CM | POA: Diagnosis not present

## 2020-02-24 DIAGNOSIS — I10 Essential (primary) hypertension: Secondary | ICD-10-CM | POA: Diagnosis present

## 2020-02-24 DIAGNOSIS — R05 Cough: Secondary | ICD-10-CM | POA: Diagnosis not present

## 2020-02-24 DIAGNOSIS — M06041 Rheumatoid arthritis without rheumatoid factor, right hand: Secondary | ICD-10-CM | POA: Diagnosis not present

## 2020-02-24 DIAGNOSIS — Z9119 Patient's noncompliance with other medical treatment and regimen: Secondary | ICD-10-CM | POA: Diagnosis not present

## 2020-02-24 DIAGNOSIS — I480 Paroxysmal atrial fibrillation: Secondary | ICD-10-CM | POA: Diagnosis present

## 2020-02-24 DIAGNOSIS — M25511 Pain in right shoulder: Secondary | ICD-10-CM | POA: Diagnosis present

## 2020-02-24 DIAGNOSIS — Z8674 Personal history of sudden cardiac arrest: Secondary | ICD-10-CM | POA: Diagnosis not present

## 2020-03-03 ENCOUNTER — Telehealth: Payer: Self-pay | Admitting: *Deleted

## 2020-03-03 NOTE — Telephone Encounter (Signed)
Vm from Adam Watts w/ [place] Pt was in hospital in [facility] Discharged with nursing, PT, & OT Pt is staying at [facility] Will you sign off on [facility] orders

## 2020-03-03 NOTE — Telephone Encounter (Signed)
LMOVM that [facility] will sign orders

## 2020-03-03 NOTE — Telephone Encounter (Signed)
Yes ma'am! 

## 2020-03-05 DIAGNOSIS — I480 Paroxysmal atrial fibrillation: Secondary | ICD-10-CM | POA: Diagnosis not present

## 2020-03-05 DIAGNOSIS — J189 Pneumonia, unspecified organism: Secondary | ICD-10-CM | POA: Diagnosis not present

## 2020-03-05 DIAGNOSIS — Z96612 Presence of left artificial shoulder joint: Secondary | ICD-10-CM | POA: Diagnosis not present

## 2020-03-05 DIAGNOSIS — M25511 Pain in right shoulder: Secondary | ICD-10-CM | POA: Diagnosis not present

## 2020-03-05 DIAGNOSIS — Z87891 Personal history of nicotine dependence: Secondary | ICD-10-CM | POA: Diagnosis not present

## 2020-03-05 DIAGNOSIS — Z7952 Long term (current) use of systemic steroids: Secondary | ICD-10-CM | POA: Diagnosis not present

## 2020-03-05 DIAGNOSIS — I2510 Atherosclerotic heart disease of native coronary artery without angina pectoris: Secondary | ICD-10-CM | POA: Diagnosis not present

## 2020-03-05 DIAGNOSIS — K3184 Gastroparesis: Secondary | ICD-10-CM | POA: Diagnosis not present

## 2020-03-05 DIAGNOSIS — Z9981 Dependence on supplemental oxygen: Secondary | ICD-10-CM | POA: Diagnosis not present

## 2020-03-05 DIAGNOSIS — Z7901 Long term (current) use of anticoagulants: Secondary | ICD-10-CM | POA: Diagnosis not present

## 2020-03-05 DIAGNOSIS — E785 Hyperlipidemia, unspecified: Secondary | ICD-10-CM | POA: Diagnosis not present

## 2020-03-05 DIAGNOSIS — J439 Emphysema, unspecified: Secondary | ICD-10-CM | POA: Diagnosis not present

## 2020-03-05 DIAGNOSIS — E1143 Type 2 diabetes mellitus with diabetic autonomic (poly)neuropathy: Secondary | ICD-10-CM | POA: Diagnosis not present

## 2020-03-05 DIAGNOSIS — M06042 Rheumatoid arthritis without rheumatoid factor, left hand: Secondary | ICD-10-CM | POA: Diagnosis not present

## 2020-03-05 DIAGNOSIS — K5730 Diverticulosis of large intestine without perforation or abscess without bleeding: Secondary | ICD-10-CM | POA: Diagnosis not present

## 2020-03-05 DIAGNOSIS — Z8674 Personal history of sudden cardiac arrest: Secondary | ICD-10-CM | POA: Diagnosis not present

## 2020-03-05 DIAGNOSIS — D500 Iron deficiency anemia secondary to blood loss (chronic): Secondary | ICD-10-CM | POA: Diagnosis not present

## 2020-03-05 DIAGNOSIS — K219 Gastro-esophageal reflux disease without esophagitis: Secondary | ICD-10-CM | POA: Diagnosis not present

## 2020-03-05 DIAGNOSIS — J690 Pneumonitis due to inhalation of food and vomit: Secondary | ICD-10-CM | POA: Diagnosis not present

## 2020-03-05 DIAGNOSIS — R32 Unspecified urinary incontinence: Secondary | ICD-10-CM | POA: Diagnosis not present

## 2020-03-05 DIAGNOSIS — I119 Hypertensive heart disease without heart failure: Secondary | ICD-10-CM | POA: Diagnosis not present

## 2020-03-05 DIAGNOSIS — Z7984 Long term (current) use of oral hypoglycemic drugs: Secondary | ICD-10-CM | POA: Diagnosis not present

## 2020-03-05 DIAGNOSIS — I10 Essential (primary) hypertension: Secondary | ICD-10-CM | POA: Diagnosis not present

## 2020-03-05 DIAGNOSIS — M06041 Rheumatoid arthritis without rheumatoid factor, right hand: Secondary | ICD-10-CM | POA: Diagnosis not present

## 2020-03-06 ENCOUNTER — Telehealth: Payer: Self-pay | Admitting: *Deleted

## 2020-03-06 DIAGNOSIS — M06041 Rheumatoid arthritis without rheumatoid factor, right hand: Secondary | ICD-10-CM | POA: Diagnosis not present

## 2020-03-06 DIAGNOSIS — J690 Pneumonitis due to inhalation of food and vomit: Secondary | ICD-10-CM | POA: Diagnosis not present

## 2020-03-06 DIAGNOSIS — M06042 Rheumatoid arthritis without rheumatoid factor, left hand: Secondary | ICD-10-CM | POA: Diagnosis not present

## 2020-03-06 DIAGNOSIS — M25511 Pain in right shoulder: Secondary | ICD-10-CM | POA: Diagnosis not present

## 2020-03-06 DIAGNOSIS — J439 Emphysema, unspecified: Secondary | ICD-10-CM | POA: Diagnosis not present

## 2020-03-06 DIAGNOSIS — J189 Pneumonia, unspecified organism: Secondary | ICD-10-CM | POA: Diagnosis not present

## 2020-03-06 NOTE — Telephone Encounter (Signed)
Vm from [facility] Pt has order for N, PT, OT & SW, refused aide & ST Needs renewal for duoneb for preventative, d/t hospitalization was for comm aquired pneumonia & on 2L of oxygen Please advise. Pt is at his second home [place], call [facility] office

## 2020-03-07 ENCOUNTER — Telehealth: Payer: Self-pay | Admitting: Family Medicine

## 2020-03-07 DIAGNOSIS — M25511 Pain in right shoulder: Secondary | ICD-10-CM | POA: Diagnosis not present

## 2020-03-07 DIAGNOSIS — M06041 Rheumatoid arthritis without rheumatoid factor, right hand: Secondary | ICD-10-CM | POA: Diagnosis not present

## 2020-03-07 DIAGNOSIS — M06042 Rheumatoid arthritis without rheumatoid factor, left hand: Secondary | ICD-10-CM | POA: Diagnosis not present

## 2020-03-07 DIAGNOSIS — J189 Pneumonia, unspecified organism: Secondary | ICD-10-CM | POA: Diagnosis not present

## 2020-03-07 DIAGNOSIS — J439 Emphysema, unspecified: Secondary | ICD-10-CM | POA: Diagnosis not present

## 2020-03-07 DIAGNOSIS — J690 Pneumonitis due to inhalation of food and vomit: Secondary | ICD-10-CM | POA: Diagnosis not present

## 2020-03-07 MED ORDER — IPRATROPIUM-ALBUTEROL 0.5-2.5 (3) MG/3ML IN SOLN: 3.0000 mL | Freq: Four times a day (QID) | RESPIRATORY_TRACT | 1 refills | Status: DC | PRN

## 2020-03-07 NOTE — Addendum Note (Signed)
Addended by: Loman Brooklyn on: 03/07/2020 09:41 AM   Modules accepted: Orders

## 2020-03-07 NOTE — Telephone Encounter (Signed)
Fax received for pa request for iprat-albut 0.5-3(2.5) mg/ 3 ml  Key: [id number]  Sent to plan

## 2020-03-07 NOTE — Telephone Encounter (Signed)
Pt has one at home up here, but [place], Just needs the duoneb sent to [facility] at [facility]

## 2020-03-07 NOTE — Telephone Encounter (Signed)
Prescription sent

## 2020-03-07 NOTE — Telephone Encounter (Signed)
He needs a prescription for duoneb? Does he have a nebulizer?

## 2020-03-08 DIAGNOSIS — M25511 Pain in right shoulder: Secondary | ICD-10-CM | POA: Diagnosis not present

## 2020-03-08 DIAGNOSIS — J690 Pneumonitis due to inhalation of food and vomit: Secondary | ICD-10-CM | POA: Diagnosis not present

## 2020-03-08 DIAGNOSIS — J189 Pneumonia, unspecified organism: Secondary | ICD-10-CM | POA: Diagnosis not present

## 2020-03-08 DIAGNOSIS — M06042 Rheumatoid arthritis without rheumatoid factor, left hand: Secondary | ICD-10-CM | POA: Diagnosis not present

## 2020-03-08 DIAGNOSIS — J439 Emphysema, unspecified: Secondary | ICD-10-CM | POA: Diagnosis not present

## 2020-03-08 DIAGNOSIS — M06041 Rheumatoid arthritis without rheumatoid factor, right hand: Secondary | ICD-10-CM | POA: Diagnosis not present

## 2020-03-09 DIAGNOSIS — E1143 Type 2 diabetes mellitus with diabetic autonomic (poly)neuropathy: Secondary | ICD-10-CM | POA: Diagnosis not present

## 2020-03-09 DIAGNOSIS — Z9229 Personal history of other drug therapy: Secondary | ICD-10-CM | POA: Diagnosis not present

## 2020-03-09 DIAGNOSIS — K3184 Gastroparesis: Secondary | ICD-10-CM | POA: Diagnosis not present

## 2020-03-09 DIAGNOSIS — Z7901 Long term (current) use of anticoagulants: Secondary | ICD-10-CM | POA: Diagnosis not present

## 2020-03-09 DIAGNOSIS — I482 Chronic atrial fibrillation, unspecified: Secondary | ICD-10-CM | POA: Diagnosis not present

## 2020-03-09 DIAGNOSIS — D500 Iron deficiency anemia secondary to blood loss (chronic): Secondary | ICD-10-CM | POA: Diagnosis not present

## 2020-03-10 DIAGNOSIS — M06042 Rheumatoid arthritis without rheumatoid factor, left hand: Secondary | ICD-10-CM | POA: Diagnosis not present

## 2020-03-10 DIAGNOSIS — M25511 Pain in right shoulder: Secondary | ICD-10-CM | POA: Diagnosis not present

## 2020-03-10 DIAGNOSIS — J189 Pneumonia, unspecified organism: Secondary | ICD-10-CM | POA: Diagnosis not present

## 2020-03-10 DIAGNOSIS — M06041 Rheumatoid arthritis without rheumatoid factor, right hand: Secondary | ICD-10-CM | POA: Diagnosis not present

## 2020-03-10 DIAGNOSIS — J690 Pneumonitis due to inhalation of food and vomit: Secondary | ICD-10-CM | POA: Diagnosis not present

## 2020-03-10 DIAGNOSIS — J439 Emphysema, unspecified: Secondary | ICD-10-CM | POA: Diagnosis not present

## 2020-03-10 NOTE — Telephone Encounter (Signed)
Script too soon for pick up - got here and then also wanted at the beach. He would have to pay out of pocket.  Appears pt is already home - as he seen GI yesterday

## 2020-03-14 ENCOUNTER — Ambulatory Visit (INDEPENDENT_AMBULATORY_CARE_PROVIDER_SITE_OTHER): Payer: Medicare Other | Admitting: Family Medicine

## 2020-03-14 DIAGNOSIS — M06041 Rheumatoid arthritis without rheumatoid factor, right hand: Secondary | ICD-10-CM | POA: Diagnosis not present

## 2020-03-14 DIAGNOSIS — J690 Pneumonitis due to inhalation of food and vomit: Secondary | ICD-10-CM | POA: Diagnosis not present

## 2020-03-14 DIAGNOSIS — I10 Essential (primary) hypertension: Secondary | ICD-10-CM

## 2020-03-14 DIAGNOSIS — M06042 Rheumatoid arthritis without rheumatoid factor, left hand: Secondary | ICD-10-CM | POA: Diagnosis not present

## 2020-03-14 DIAGNOSIS — M25511 Pain in right shoulder: Secondary | ICD-10-CM | POA: Diagnosis not present

## 2020-03-14 DIAGNOSIS — J189 Pneumonia, unspecified organism: Secondary | ICD-10-CM | POA: Diagnosis not present

## 2020-03-14 DIAGNOSIS — J439 Emphysema, unspecified: Secondary | ICD-10-CM | POA: Diagnosis not present

## 2020-03-14 NOTE — Progress Notes (Signed)
Virtual Visit via Telephone Note  I connected with Adam Watts on 03/14/20 at 12:39 PM by telephone and verified that I am speaking with the correct person using two identifiers. Adam Watts [place] his [place] and his wife is currently with him during this visit. The provider, Loman Brooklyn, FNP is located in their office at time of visit.  I discussed the limitations, risks, security and privacy concerns of performing an evaluation and management service by telephone and the availability of in person appointments. I also discussed with the patient that there may be a patient responsible charge related to this service. The patient expressed understanding and agreed to proceed.  Subjective: PCP: Loman Brooklyn, FNP  Chief Complaint  Patient presents with  . Hypotension   Patient requested appointment to discuss his blood pressure.  He has a nurse coming out twice a week who advised that he call.  He currently takes diltiazem 360 mg at night and hydrochlorothiazide 12.5 mg with losartan 100 mg in the morning.  The lowest his blood pressure has been was 80/40s.  Most of the time his systolic is staying above 90 but on the low end and making him feel sluggish.  He denies any swelling now or prior to being put on hydrochlorothiazide.   ROS: Per HPI  Current Outpatient Medications:  .  CINNAMON PO, Take 2,000 mg by mouth daily. , Disp: , Rfl:  .  diltiazem (TIAZAC) 360 MG 24 hr capsule, TAKE 1 CAPSULE BY MOUTH EVERY DAY, Disp: 90 capsule, Rfl: 1 .  golimumab (SIMPONI ARIA) 50 MG/4ML SOLN injection, Inject into the vein every 8 (eight) weeks., Disp: , Rfl:  .  hydrochlorothiazide (MICROZIDE) 12.5 MG capsule, Take 1 capsule (12.5 mg total) by mouth daily., Disp: 90 capsule, Rfl: 1 .  ipratropium-albuterol (DUONEB) 0.5-2.5 (3) MG/3ML SOLN, Take 3 mLs by nebulization every 6 (six) hours as needed., Disp: 360 mL, Rfl: 1 .  loratadine (CLARITIN) 10 MG tablet,  Take 10 mg by mouth daily., Disp: , Rfl:  .  losartan (COZAAR) 100 MG tablet, Take 100 mg by mouth daily., Disp: , Rfl:  .  metFORMIN (GLUCOPHAGE-XR) 500 MG 24 hr tablet, Take 1 tablet (500 mg total) by mouth 2 (two) times daily with a meal., Disp: 180 tablet, Rfl: 1 .  mirtazapine (REMERON) 15 MG tablet, Take 1 tablet (15 mg total) by mouth at bedtime., Disp: 90 tablet, Rfl: 1 .  Multiple Vitamins-Minerals (MEGA MULTIVITAMIN FOR MEN PO), Take 1 tablet by mouth daily. , Disp: , Rfl:  .  omeprazole (PRILOSEC) 20 MG capsule, Take 20 mg by mouth daily., Disp: , Rfl:  .  rivaroxaban (XARELTO) 20 MG TABS tablet, TAKE 1 TABLET BY MOUTH DAILY WITH SUPPER, Disp: 90 tablet, Rfl: 1 No current facility-administered medications for this visit.  Facility-Administered Medications Ordered in Other Visits:  .  ondansetron (ZOFRAN) 4 mg in sodium chloride 0.9 % 50 mL IVPB, 4 mg, Intravenous, Q6H PRN, Ashok Pall, MD  No Known Allergies Past Medical History:  Diagnosis Date  . A-fib ([place])   . Chest pain   . Chronic back pain   . Dysrhythmia    a-fib  . Fracture of lateral malleolus 02/11/2015  . Gastro-esophageal reflux disease with esophagitis   . Hyperlipidemia, unspecified   . Hypertension   . Insomnia, unspecified   . Lumbar radiculopathy   . Nicotine dependence, unspecified, uncomplicated   . Pacemaker    CHB,  PAF  . Pain in left shoulder   . Pain in right leg   . Pneumonia   . Pre-diabetes   . Presence of cardiac pacemaker   . Rheumatoid arthritis, unspecified ([place])   . Type 2 diabetes mellitus without complications ([place])   . Unilateral inguinal hernia, without obstruction or gangrene, not specified as recurrent     Observations/Objective: A&O  No respiratory distress or wheezing audible over the phone Mood, judgement, and thought processes all WNL   Assessment and Plan: 1. Essential hypertension - BP has been running too low.  Advised patient to stop hydrochlorothiazide and  monitor his blood pressure.   Follow Up Instructions:  I discussed the assessment and treatment plan with the patient. The patient was provided an opportunity to ask questions and all were answered. The patient agreed with the plan and demonstrated an understanding of the instructions.   The patient was advised to call back or seek an in-person evaluation if the symptoms worsen or if the condition fails to improve as anticipated.  The above assessment and management plan was discussed with the patient. The patient verbalized understanding of and has agreed to the management plan. Patient is aware to call the clinic if symptoms persist or worsen. Patient is aware when to return to the clinic for a follow-up visit. Patient educated on when it is appropriate to go to the emergency department.   Time call ended: 12:49 PM  I provided 12 minutes of non-face-to-face time during this encounter.  [name], MSN, APRN, FNP-C [facility] 03/14/20

## 2020-03-15 DIAGNOSIS — M06041 Rheumatoid arthritis without rheumatoid factor, right hand: Secondary | ICD-10-CM | POA: Diagnosis not present

## 2020-03-15 DIAGNOSIS — M25511 Pain in right shoulder: Secondary | ICD-10-CM | POA: Diagnosis not present

## 2020-03-15 DIAGNOSIS — M06042 Rheumatoid arthritis without rheumatoid factor, left hand: Secondary | ICD-10-CM | POA: Diagnosis not present

## 2020-03-15 DIAGNOSIS — J189 Pneumonia, unspecified organism: Secondary | ICD-10-CM | POA: Diagnosis not present

## 2020-03-15 DIAGNOSIS — J439 Emphysema, unspecified: Secondary | ICD-10-CM | POA: Diagnosis not present

## 2020-03-15 DIAGNOSIS — J690 Pneumonitis due to inhalation of food and vomit: Secondary | ICD-10-CM | POA: Diagnosis not present

## 2020-03-16 DIAGNOSIS — M06041 Rheumatoid arthritis without rheumatoid factor, right hand: Secondary | ICD-10-CM | POA: Diagnosis not present

## 2020-03-16 DIAGNOSIS — J189 Pneumonia, unspecified organism: Secondary | ICD-10-CM | POA: Diagnosis not present

## 2020-03-16 DIAGNOSIS — J690 Pneumonitis due to inhalation of food and vomit: Secondary | ICD-10-CM | POA: Diagnosis not present

## 2020-03-16 DIAGNOSIS — J439 Emphysema, unspecified: Secondary | ICD-10-CM | POA: Diagnosis not present

## 2020-03-16 DIAGNOSIS — M25511 Pain in right shoulder: Secondary | ICD-10-CM | POA: Diagnosis not present

## 2020-03-16 DIAGNOSIS — M06042 Rheumatoid arthritis without rheumatoid factor, left hand: Secondary | ICD-10-CM | POA: Diagnosis not present

## 2020-03-20 ENCOUNTER — Encounter: Payer: Self-pay | Admitting: Family Medicine

## 2020-03-20 DIAGNOSIS — J690 Pneumonitis due to inhalation of food and vomit: Secondary | ICD-10-CM | POA: Diagnosis not present

## 2020-03-20 DIAGNOSIS — J439 Emphysema, unspecified: Secondary | ICD-10-CM | POA: Diagnosis not present

## 2020-03-20 DIAGNOSIS — M06041 Rheumatoid arthritis without rheumatoid factor, right hand: Secondary | ICD-10-CM | POA: Diagnosis not present

## 2020-03-20 DIAGNOSIS — M25511 Pain in right shoulder: Secondary | ICD-10-CM | POA: Diagnosis not present

## 2020-03-20 DIAGNOSIS — M06042 Rheumatoid arthritis without rheumatoid factor, left hand: Secondary | ICD-10-CM | POA: Diagnosis not present

## 2020-03-20 DIAGNOSIS — J189 Pneumonia, unspecified organism: Secondary | ICD-10-CM | POA: Diagnosis not present

## 2020-03-21 DIAGNOSIS — J690 Pneumonitis due to inhalation of food and vomit: Secondary | ICD-10-CM | POA: Diagnosis not present

## 2020-03-21 DIAGNOSIS — M06041 Rheumatoid arthritis without rheumatoid factor, right hand: Secondary | ICD-10-CM | POA: Diagnosis not present

## 2020-03-21 DIAGNOSIS — J189 Pneumonia, unspecified organism: Secondary | ICD-10-CM | POA: Diagnosis not present

## 2020-03-21 DIAGNOSIS — J439 Emphysema, unspecified: Secondary | ICD-10-CM | POA: Diagnosis not present

## 2020-03-21 DIAGNOSIS — M06042 Rheumatoid arthritis without rheumatoid factor, left hand: Secondary | ICD-10-CM | POA: Diagnosis not present

## 2020-03-21 DIAGNOSIS — M25511 Pain in right shoulder: Secondary | ICD-10-CM | POA: Diagnosis not present

## 2020-03-22 DIAGNOSIS — J189 Pneumonia, unspecified organism: Secondary | ICD-10-CM | POA: Diagnosis not present

## 2020-03-22 DIAGNOSIS — J690 Pneumonitis due to inhalation of food and vomit: Secondary | ICD-10-CM | POA: Diagnosis not present

## 2020-03-22 DIAGNOSIS — M06041 Rheumatoid arthritis without rheumatoid factor, right hand: Secondary | ICD-10-CM | POA: Diagnosis not present

## 2020-03-22 DIAGNOSIS — M25511 Pain in right shoulder: Secondary | ICD-10-CM | POA: Diagnosis not present

## 2020-03-22 DIAGNOSIS — J439 Emphysema, unspecified: Secondary | ICD-10-CM | POA: Diagnosis not present

## 2020-03-22 DIAGNOSIS — M06042 Rheumatoid arthritis without rheumatoid factor, left hand: Secondary | ICD-10-CM | POA: Diagnosis not present

## 2020-03-23 ENCOUNTER — Ambulatory Visit (INDEPENDENT_AMBULATORY_CARE_PROVIDER_SITE_OTHER): Payer: Medicare Other | Admitting: *Deleted

## 2020-03-23 ENCOUNTER — Telehealth: Payer: Self-pay

## 2020-03-23 DIAGNOSIS — I442 Atrioventricular block, complete: Secondary | ICD-10-CM

## 2020-03-23 LAB — CUP PACEART REMOTE DEVICE CHECK
Battery Impedance: 797 Ohm
Battery Remaining Longevity: 71 mo
Battery Voltage: 2.78 V
Brady Statistic AP VP Percent: 19 %
Brady Statistic AP VS Percent: 2 %
Brady Statistic AS VP Percent: 42 %
Brady Statistic AS VS Percent: 36 %
Date Time Interrogation Session: 20210826081833
Implantable Lead Implant Date: 20140421
Implantable Lead Implant Date: 20140421
Implantable Lead Location: 753859
Implantable Lead Location: 753860
Implantable Lead Model: 5076
Implantable Lead Model: 5592
Implantable Pulse Generator Implant Date: 20140421
Lead Channel Impedance Value: 433 Ohm
Lead Channel Impedance Value: 466 Ohm
Lead Channel Pacing Threshold Amplitude: 0.5 V
Lead Channel Pacing Threshold Amplitude: 0.75 V
Lead Channel Pacing Threshold Pulse Width: 0.4 ms
Lead Channel Pacing Threshold Pulse Width: 0.4 ms
Lead Channel Setting Pacing Amplitude: 1.5 V
Lead Channel Setting Pacing Amplitude: 2 V
Lead Channel Setting Pacing Pulse Width: 0.4 ms
Lead Channel Setting Sensing Sensitivity: 4 mV

## 2020-03-23 NOTE — Telephone Encounter (Signed)
Patient returned phone call. States he has been not feeling well since mid July. Had admission at [facility] for sepsis bilateral pneumonia. Patient is currently still at the [facility] and has home health coming in for home visits. Patient states he has felt weak and does not have his strength back yet. Patient reports compliance with medications. Advised patient I will forward this to Dr. Lovena Le to see if he has any recommendations or changes. Patient verbalizes understanding and agrees to plan.   Correction to previous not. Patient is taking diltizem 360 mg one time a day.

## 2020-03-23 NOTE — Telephone Encounter (Signed)
[facility] scheduled remote reviewed. Appears persistent AF w/ good ventricular control since beginning of June 2021. Known hx of PAF. Emison Xarelto. Med list diltizem 360 mg BID.  Called patient to assess, no answer. LMOVM.

## 2020-03-28 NOTE — Progress Notes (Signed)
Remote pacemaker transmission.   

## 2020-03-29 DIAGNOSIS — M25511 Pain in right shoulder: Secondary | ICD-10-CM | POA: Diagnosis not present

## 2020-03-29 DIAGNOSIS — M06042 Rheumatoid arthritis without rheumatoid factor, left hand: Secondary | ICD-10-CM | POA: Diagnosis not present

## 2020-03-29 DIAGNOSIS — J690 Pneumonitis due to inhalation of food and vomit: Secondary | ICD-10-CM | POA: Diagnosis not present

## 2020-03-29 DIAGNOSIS — J439 Emphysema, unspecified: Secondary | ICD-10-CM | POA: Diagnosis not present

## 2020-03-29 DIAGNOSIS — J189 Pneumonia, unspecified organism: Secondary | ICD-10-CM | POA: Diagnosis not present

## 2020-03-29 DIAGNOSIS — M06041 Rheumatoid arthritis without rheumatoid factor, right hand: Secondary | ICD-10-CM | POA: Diagnosis not present

## 2020-03-29 NOTE — Telephone Encounter (Signed)
followup with me in 3-4 weeks. GT

## 2020-03-30 DIAGNOSIS — J189 Pneumonia, unspecified organism: Secondary | ICD-10-CM | POA: Diagnosis not present

## 2020-03-30 DIAGNOSIS — M06042 Rheumatoid arthritis without rheumatoid factor, left hand: Secondary | ICD-10-CM | POA: Diagnosis not present

## 2020-03-30 DIAGNOSIS — J690 Pneumonitis due to inhalation of food and vomit: Secondary | ICD-10-CM | POA: Diagnosis not present

## 2020-03-30 DIAGNOSIS — J439 Emphysema, unspecified: Secondary | ICD-10-CM | POA: Diagnosis not present

## 2020-03-30 DIAGNOSIS — M06041 Rheumatoid arthritis without rheumatoid factor, right hand: Secondary | ICD-10-CM | POA: Diagnosis not present

## 2020-03-30 DIAGNOSIS — M25511 Pain in right shoulder: Secondary | ICD-10-CM | POA: Diagnosis not present

## 2020-03-30 NOTE — Telephone Encounter (Signed)
Per Dr. Lovena Le, this patient will need a follow-up in 3-4 weeks.   Thanks!

## 2020-04-04 DIAGNOSIS — K3184 Gastroparesis: Secondary | ICD-10-CM | POA: Diagnosis not present

## 2020-04-04 DIAGNOSIS — Z8674 Personal history of sudden cardiac arrest: Secondary | ICD-10-CM | POA: Diagnosis not present

## 2020-04-04 DIAGNOSIS — M25511 Pain in right shoulder: Secondary | ICD-10-CM | POA: Diagnosis not present

## 2020-04-04 DIAGNOSIS — I480 Paroxysmal atrial fibrillation: Secondary | ICD-10-CM | POA: Diagnosis not present

## 2020-04-04 DIAGNOSIS — M06041 Rheumatoid arthritis without rheumatoid factor, right hand: Secondary | ICD-10-CM | POA: Diagnosis not present

## 2020-04-04 DIAGNOSIS — Z7952 Long term (current) use of systemic steroids: Secondary | ICD-10-CM | POA: Diagnosis not present

## 2020-04-04 DIAGNOSIS — Z87891 Personal history of nicotine dependence: Secondary | ICD-10-CM | POA: Diagnosis not present

## 2020-04-04 DIAGNOSIS — J439 Emphysema, unspecified: Secondary | ICD-10-CM | POA: Diagnosis not present

## 2020-04-04 DIAGNOSIS — K5730 Diverticulosis of large intestine without perforation or abscess without bleeding: Secondary | ICD-10-CM | POA: Diagnosis not present

## 2020-04-04 DIAGNOSIS — I119 Hypertensive heart disease without heart failure: Secondary | ICD-10-CM | POA: Diagnosis not present

## 2020-04-04 DIAGNOSIS — E785 Hyperlipidemia, unspecified: Secondary | ICD-10-CM | POA: Diagnosis not present

## 2020-04-04 DIAGNOSIS — Z7901 Long term (current) use of anticoagulants: Secondary | ICD-10-CM | POA: Diagnosis not present

## 2020-04-04 DIAGNOSIS — M06042 Rheumatoid arthritis without rheumatoid factor, left hand: Secondary | ICD-10-CM | POA: Diagnosis not present

## 2020-04-04 DIAGNOSIS — I2510 Atherosclerotic heart disease of native coronary artery without angina pectoris: Secondary | ICD-10-CM | POA: Diagnosis not present

## 2020-04-04 DIAGNOSIS — J189 Pneumonia, unspecified organism: Secondary | ICD-10-CM | POA: Diagnosis not present

## 2020-04-04 DIAGNOSIS — J690 Pneumonitis due to inhalation of food and vomit: Secondary | ICD-10-CM | POA: Diagnosis not present

## 2020-04-04 DIAGNOSIS — Z9981 Dependence on supplemental oxygen: Secondary | ICD-10-CM | POA: Diagnosis not present

## 2020-04-04 DIAGNOSIS — Z96612 Presence of left artificial shoulder joint: Secondary | ICD-10-CM | POA: Diagnosis not present

## 2020-04-04 DIAGNOSIS — D500 Iron deficiency anemia secondary to blood loss (chronic): Secondary | ICD-10-CM | POA: Diagnosis not present

## 2020-04-04 DIAGNOSIS — Z7984 Long term (current) use of oral hypoglycemic drugs: Secondary | ICD-10-CM | POA: Diagnosis not present

## 2020-04-04 DIAGNOSIS — R32 Unspecified urinary incontinence: Secondary | ICD-10-CM | POA: Diagnosis not present

## 2020-04-04 DIAGNOSIS — E1143 Type 2 diabetes mellitus with diabetic autonomic (poly)neuropathy: Secondary | ICD-10-CM | POA: Diagnosis not present

## 2020-04-04 DIAGNOSIS — K219 Gastro-esophageal reflux disease without esophagitis: Secondary | ICD-10-CM | POA: Diagnosis not present

## 2020-04-04 DIAGNOSIS — I10 Essential (primary) hypertension: Secondary | ICD-10-CM | POA: Diagnosis not present

## 2020-04-05 DIAGNOSIS — M0579 Rheumatoid arthritis with rheumatoid factor of multiple sites without organ or systems involvement: Secondary | ICD-10-CM | POA: Diagnosis not present

## 2020-04-05 DIAGNOSIS — Z79899 Other long term (current) drug therapy: Secondary | ICD-10-CM | POA: Diagnosis not present

## 2020-04-06 DIAGNOSIS — M06041 Rheumatoid arthritis without rheumatoid factor, right hand: Secondary | ICD-10-CM | POA: Diagnosis not present

## 2020-04-06 DIAGNOSIS — Z79899 Other long term (current) drug therapy: Secondary | ICD-10-CM | POA: Diagnosis not present

## 2020-04-06 DIAGNOSIS — J690 Pneumonitis due to inhalation of food and vomit: Secondary | ICD-10-CM | POA: Diagnosis not present

## 2020-04-06 DIAGNOSIS — K219 Gastro-esophageal reflux disease without esophagitis: Secondary | ICD-10-CM | POA: Diagnosis not present

## 2020-04-06 DIAGNOSIS — Z7984 Long term (current) use of oral hypoglycemic drugs: Secondary | ICD-10-CM | POA: Diagnosis not present

## 2020-04-06 DIAGNOSIS — J439 Emphysema, unspecified: Secondary | ICD-10-CM | POA: Diagnosis not present

## 2020-04-06 DIAGNOSIS — I110 Hypertensive heart disease with heart failure: Secondary | ICD-10-CM | POA: Diagnosis not present

## 2020-04-06 DIAGNOSIS — M25511 Pain in right shoulder: Secondary | ICD-10-CM | POA: Diagnosis not present

## 2020-04-06 DIAGNOSIS — M79671 Pain in right foot: Secondary | ICD-10-CM | POA: Diagnosis not present

## 2020-04-06 DIAGNOSIS — Z87891 Personal history of nicotine dependence: Secondary | ICD-10-CM | POA: Diagnosis not present

## 2020-04-06 DIAGNOSIS — M069 Rheumatoid arthritis, unspecified: Secondary | ICD-10-CM | POA: Diagnosis not present

## 2020-04-06 DIAGNOSIS — J189 Pneumonia, unspecified organism: Secondary | ICD-10-CM | POA: Diagnosis not present

## 2020-04-06 DIAGNOSIS — E119 Type 2 diabetes mellitus without complications: Secondary | ICD-10-CM | POA: Diagnosis not present

## 2020-04-06 DIAGNOSIS — M84471D Pathological fracture, right ankle, subsequent encounter for fracture with routine healing: Secondary | ICD-10-CM | POA: Diagnosis not present

## 2020-04-06 DIAGNOSIS — E785 Hyperlipidemia, unspecified: Secondary | ICD-10-CM | POA: Diagnosis not present

## 2020-04-06 DIAGNOSIS — M06042 Rheumatoid arthritis without rheumatoid factor, left hand: Secondary | ICD-10-CM | POA: Diagnosis not present

## 2020-04-06 DIAGNOSIS — R0602 Shortness of breath: Secondary | ICD-10-CM | POA: Diagnosis not present

## 2020-04-06 DIAGNOSIS — Z20822 Contact with and (suspected) exposure to covid-19: Secondary | ICD-10-CM | POA: Diagnosis not present

## 2020-04-06 DIAGNOSIS — I480 Paroxysmal atrial fibrillation: Secondary | ICD-10-CM | POA: Diagnosis not present

## 2020-04-06 DIAGNOSIS — M109 Gout, unspecified: Secondary | ICD-10-CM | POA: Diagnosis not present

## 2020-04-06 DIAGNOSIS — M19071 Primary osteoarthritis, right ankle and foot: Secondary | ICD-10-CM | POA: Diagnosis not present

## 2020-04-06 DIAGNOSIS — M79604 Pain in right leg: Secondary | ICD-10-CM | POA: Diagnosis not present

## 2020-04-06 DIAGNOSIS — R590 Localized enlarged lymph nodes: Secondary | ICD-10-CM | POA: Diagnosis not present

## 2020-04-06 DIAGNOSIS — Z950 Presence of cardiac pacemaker: Secondary | ICD-10-CM | POA: Diagnosis not present

## 2020-04-06 DIAGNOSIS — I5033 Acute on chronic diastolic (congestive) heart failure: Secondary | ICD-10-CM | POA: Diagnosis not present

## 2020-04-06 DIAGNOSIS — R918 Other nonspecific abnormal finding of lung field: Secondary | ICD-10-CM | POA: Diagnosis not present

## 2020-04-07 ENCOUNTER — Telehealth: Payer: Self-pay | Admitting: Family Medicine

## 2020-04-07 DIAGNOSIS — I5033 Acute on chronic diastolic (congestive) heart failure: Secondary | ICD-10-CM | POA: Diagnosis not present

## 2020-04-07 DIAGNOSIS — I4819 Other persistent atrial fibrillation: Secondary | ICD-10-CM | POA: Diagnosis not present

## 2020-04-07 DIAGNOSIS — I5022 Chronic systolic (congestive) heart failure: Secondary | ICD-10-CM | POA: Diagnosis not present

## 2020-04-07 DIAGNOSIS — I10 Essential (primary) hypertension: Secondary | ICD-10-CM | POA: Diagnosis not present

## 2020-04-07 NOTE — Telephone Encounter (Signed)
Appointment given for Monday with [facility] for hospital follow up.

## 2020-04-10 ENCOUNTER — Ambulatory Visit: Payer: Medicare Other | Admitting: Family Medicine

## 2020-04-10 ENCOUNTER — Telehealth: Payer: Self-pay | Admitting: Family Medicine

## 2020-04-10 DIAGNOSIS — J439 Emphysema, unspecified: Secondary | ICD-10-CM | POA: Diagnosis not present

## 2020-04-10 DIAGNOSIS — J189 Pneumonia, unspecified organism: Secondary | ICD-10-CM | POA: Diagnosis not present

## 2020-04-10 DIAGNOSIS — M25511 Pain in right shoulder: Secondary | ICD-10-CM | POA: Diagnosis not present

## 2020-04-10 DIAGNOSIS — J690 Pneumonitis due to inhalation of food and vomit: Secondary | ICD-10-CM | POA: Diagnosis not present

## 2020-04-10 DIAGNOSIS — M06042 Rheumatoid arthritis without rheumatoid factor, left hand: Secondary | ICD-10-CM | POA: Diagnosis not present

## 2020-04-10 DIAGNOSIS — M06041 Rheumatoid arthritis without rheumatoid factor, right hand: Secondary | ICD-10-CM | POA: Diagnosis not present

## 2020-04-12 ENCOUNTER — Ambulatory Visit: Payer: Medicare Other | Admitting: Nurse Practitioner

## 2020-04-14 DIAGNOSIS — M06041 Rheumatoid arthritis without rheumatoid factor, right hand: Secondary | ICD-10-CM | POA: Diagnosis not present

## 2020-04-14 DIAGNOSIS — M06042 Rheumatoid arthritis without rheumatoid factor, left hand: Secondary | ICD-10-CM | POA: Diagnosis not present

## 2020-04-14 DIAGNOSIS — M25511 Pain in right shoulder: Secondary | ICD-10-CM | POA: Diagnosis not present

## 2020-04-14 DIAGNOSIS — J439 Emphysema, unspecified: Secondary | ICD-10-CM | POA: Diagnosis not present

## 2020-04-14 DIAGNOSIS — J690 Pneumonitis due to inhalation of food and vomit: Secondary | ICD-10-CM | POA: Diagnosis not present

## 2020-04-14 DIAGNOSIS — J189 Pneumonia, unspecified organism: Secondary | ICD-10-CM | POA: Diagnosis not present

## 2020-04-17 DIAGNOSIS — R079 Chest pain, unspecified: Secondary | ICD-10-CM | POA: Diagnosis not present

## 2020-04-17 DIAGNOSIS — I5033 Acute on chronic diastolic (congestive) heart failure: Secondary | ICD-10-CM | POA: Diagnosis not present

## 2020-04-17 DIAGNOSIS — I4819 Other persistent atrial fibrillation: Secondary | ICD-10-CM | POA: Diagnosis not present

## 2020-04-17 DIAGNOSIS — K5521 Angiodysplasia of colon with hemorrhage: Secondary | ICD-10-CM | POA: Diagnosis not present

## 2020-04-17 DIAGNOSIS — E876 Hypokalemia: Secondary | ICD-10-CM | POA: Diagnosis not present

## 2020-04-17 DIAGNOSIS — R600 Localized edema: Secondary | ICD-10-CM | POA: Diagnosis not present

## 2020-04-17 DIAGNOSIS — R0600 Dyspnea, unspecified: Secondary | ICD-10-CM | POA: Diagnosis not present

## 2020-04-17 DIAGNOSIS — I442 Atrioventricular block, complete: Secondary | ICD-10-CM | POA: Diagnosis not present

## 2020-04-17 DIAGNOSIS — I110 Hypertensive heart disease with heart failure: Secondary | ICD-10-CM | POA: Diagnosis not present

## 2020-04-17 DIAGNOSIS — Z950 Presence of cardiac pacemaker: Secondary | ICD-10-CM | POA: Diagnosis not present

## 2020-04-17 DIAGNOSIS — J441 Chronic obstructive pulmonary disease with (acute) exacerbation: Secondary | ICD-10-CM | POA: Diagnosis not present

## 2020-04-17 DIAGNOSIS — R0602 Shortness of breath: Secondary | ICD-10-CM | POA: Diagnosis not present

## 2020-04-17 DIAGNOSIS — R918 Other nonspecific abnormal finding of lung field: Secondary | ICD-10-CM | POA: Diagnosis not present

## 2020-04-17 DIAGNOSIS — R109 Unspecified abdominal pain: Secondary | ICD-10-CM | POA: Diagnosis not present

## 2020-04-17 DIAGNOSIS — I10 Essential (primary) hypertension: Secondary | ICD-10-CM | POA: Diagnosis not present

## 2020-04-17 DIAGNOSIS — J8410 Pulmonary fibrosis, unspecified: Secondary | ICD-10-CM | POA: Diagnosis not present

## 2020-04-17 DIAGNOSIS — N179 Acute kidney failure, unspecified: Secondary | ICD-10-CM | POA: Diagnosis not present

## 2020-04-18 DIAGNOSIS — I5033 Acute on chronic diastolic (congestive) heart failure: Secondary | ICD-10-CM | POA: Diagnosis not present

## 2020-04-18 DIAGNOSIS — Z950 Presence of cardiac pacemaker: Secondary | ICD-10-CM | POA: Diagnosis not present

## 2020-04-18 DIAGNOSIS — I10 Essential (primary) hypertension: Secondary | ICD-10-CM | POA: Diagnosis not present

## 2020-04-18 DIAGNOSIS — E876 Hypokalemia: Secondary | ICD-10-CM | POA: Diagnosis not present

## 2020-04-18 DIAGNOSIS — R0600 Dyspnea, unspecified: Secondary | ICD-10-CM | POA: Diagnosis not present

## 2020-04-18 DIAGNOSIS — I4819 Other persistent atrial fibrillation: Secondary | ICD-10-CM | POA: Diagnosis not present

## 2020-04-19 DIAGNOSIS — D120 Benign neoplasm of cecum: Secondary | ICD-10-CM | POA: Diagnosis not present

## 2020-04-19 DIAGNOSIS — I5032 Chronic diastolic (congestive) heart failure: Secondary | ICD-10-CM | POA: Insufficient documentation

## 2020-04-19 DIAGNOSIS — E1159 Type 2 diabetes mellitus with other circulatory complications: Secondary | ICD-10-CM | POA: Diagnosis present

## 2020-04-19 DIAGNOSIS — K648 Other hemorrhoids: Secondary | ICD-10-CM | POA: Diagnosis not present

## 2020-04-19 DIAGNOSIS — K2960 Other gastritis without bleeding: Secondary | ICD-10-CM | POA: Diagnosis not present

## 2020-04-19 DIAGNOSIS — K3189 Other diseases of stomach and duodenum: Secondary | ICD-10-CM | POA: Diagnosis not present

## 2020-04-19 DIAGNOSIS — J441 Chronic obstructive pulmonary disease with (acute) exacerbation: Secondary | ICD-10-CM | POA: Diagnosis not present

## 2020-04-19 DIAGNOSIS — K5521 Angiodysplasia of colon with hemorrhage: Secondary | ICD-10-CM | POA: Diagnosis not present

## 2020-04-19 DIAGNOSIS — K5710 Diverticulosis of small intestine without perforation or abscess without bleeding: Secondary | ICD-10-CM | POA: Diagnosis present

## 2020-04-19 DIAGNOSIS — D649 Anemia, unspecified: Secondary | ICD-10-CM | POA: Diagnosis present

## 2020-04-19 DIAGNOSIS — E1143 Type 2 diabetes mellitus with diabetic autonomic (poly)neuropathy: Secondary | ICD-10-CM | POA: Diagnosis not present

## 2020-04-19 DIAGNOSIS — K3184 Gastroparesis: Secondary | ICD-10-CM | POA: Diagnosis not present

## 2020-04-19 DIAGNOSIS — K922 Gastrointestinal hemorrhage, unspecified: Secondary | ICD-10-CM | POA: Diagnosis not present

## 2020-04-19 DIAGNOSIS — M1990 Unspecified osteoarthritis, unspecified site: Secondary | ICD-10-CM | POA: Diagnosis not present

## 2020-04-19 DIAGNOSIS — I110 Hypertensive heart disease with heart failure: Secondary | ICD-10-CM | POA: Diagnosis present

## 2020-04-19 DIAGNOSIS — K635 Polyp of colon: Secondary | ICD-10-CM | POA: Diagnosis not present

## 2020-04-19 DIAGNOSIS — M069 Rheumatoid arthritis, unspecified: Secondary | ICD-10-CM | POA: Diagnosis present

## 2020-04-19 DIAGNOSIS — K2970 Gastritis, unspecified, without bleeding: Secondary | ICD-10-CM | POA: Diagnosis not present

## 2020-04-19 DIAGNOSIS — I10 Essential (primary) hypertension: Secondary | ICD-10-CM | POA: Diagnosis not present

## 2020-04-19 DIAGNOSIS — I480 Paroxysmal atrial fibrillation: Secondary | ICD-10-CM | POA: Diagnosis not present

## 2020-04-19 DIAGNOSIS — I4819 Other persistent atrial fibrillation: Secondary | ICD-10-CM | POA: Diagnosis present

## 2020-04-19 DIAGNOSIS — I5033 Acute on chronic diastolic (congestive) heart failure: Secondary | ICD-10-CM | POA: Diagnosis present

## 2020-04-19 DIAGNOSIS — K209 Esophagitis, unspecified without bleeding: Secondary | ICD-10-CM | POA: Diagnosis present

## 2020-04-19 DIAGNOSIS — R079 Chest pain, unspecified: Secondary | ICD-10-CM | POA: Diagnosis not present

## 2020-04-19 DIAGNOSIS — D62 Acute posthemorrhagic anemia: Secondary | ICD-10-CM | POA: Diagnosis not present

## 2020-04-19 DIAGNOSIS — K449 Diaphragmatic hernia without obstruction or gangrene: Secondary | ICD-10-CM | POA: Diagnosis not present

## 2020-04-19 DIAGNOSIS — E039 Hypothyroidism, unspecified: Secondary | ICD-10-CM | POA: Diagnosis present

## 2020-04-19 DIAGNOSIS — E785 Hyperlipidemia, unspecified: Secondary | ICD-10-CM | POA: Diagnosis not present

## 2020-04-19 DIAGNOSIS — Z950 Presence of cardiac pacemaker: Secondary | ICD-10-CM | POA: Diagnosis not present

## 2020-04-19 DIAGNOSIS — N179 Acute kidney failure, unspecified: Secondary | ICD-10-CM | POA: Diagnosis present

## 2020-04-19 DIAGNOSIS — K5730 Diverticulosis of large intestine without perforation or abscess without bleeding: Secondary | ICD-10-CM | POA: Diagnosis not present

## 2020-04-19 DIAGNOSIS — I442 Atrioventricular block, complete: Secondary | ICD-10-CM | POA: Diagnosis present

## 2020-04-19 DIAGNOSIS — R0600 Dyspnea, unspecified: Secondary | ICD-10-CM | POA: Diagnosis not present

## 2020-04-19 DIAGNOSIS — K640 First degree hemorrhoids: Secondary | ICD-10-CM | POA: Diagnosis not present

## 2020-04-19 DIAGNOSIS — K219 Gastro-esophageal reflux disease without esophagitis: Secondary | ICD-10-CM | POA: Diagnosis not present

## 2020-04-19 DIAGNOSIS — R0609 Other forms of dyspnea: Secondary | ICD-10-CM | POA: Diagnosis present

## 2020-04-19 DIAGNOSIS — D123 Benign neoplasm of transverse colon: Secondary | ICD-10-CM | POA: Diagnosis not present

## 2020-04-19 DIAGNOSIS — F17200 Nicotine dependence, unspecified, uncomplicated: Secondary | ICD-10-CM | POA: Diagnosis not present

## 2020-04-19 DIAGNOSIS — K222 Esophageal obstruction: Secondary | ICD-10-CM | POA: Diagnosis not present

## 2020-04-19 LAB — HM COLONOSCOPY

## 2020-04-26 ENCOUNTER — Encounter: Payer: Self-pay | Admitting: Family Medicine

## 2020-04-26 ENCOUNTER — Telehealth: Payer: Self-pay | Admitting: Family Medicine

## 2020-04-26 ENCOUNTER — Ambulatory Visit (INDEPENDENT_AMBULATORY_CARE_PROVIDER_SITE_OTHER): Payer: Medicare Other | Admitting: Family Medicine

## 2020-04-26 ENCOUNTER — Other Ambulatory Visit: Payer: Self-pay

## 2020-04-26 VITALS — BP 102/70 | HR 82 | Temp 97.9°F | Ht 66.0 in | Wt 176.6 lb

## 2020-04-26 DIAGNOSIS — I509 Heart failure, unspecified: Secondary | ICD-10-CM

## 2020-04-26 DIAGNOSIS — R05 Cough: Secondary | ICD-10-CM

## 2020-04-26 DIAGNOSIS — Z8701 Personal history of pneumonia (recurrent): Secondary | ICD-10-CM | POA: Diagnosis not present

## 2020-04-26 DIAGNOSIS — R059 Cough, unspecified: Secondary | ICD-10-CM

## 2020-04-26 DIAGNOSIS — J449 Chronic obstructive pulmonary disease, unspecified: Secondary | ICD-10-CM | POA: Diagnosis not present

## 2020-04-26 DIAGNOSIS — R0602 Shortness of breath: Secondary | ICD-10-CM

## 2020-04-26 DIAGNOSIS — K922 Gastrointestinal hemorrhage, unspecified: Secondary | ICD-10-CM | POA: Diagnosis not present

## 2020-04-26 DIAGNOSIS — Z8719 Personal history of other diseases of the digestive system: Secondary | ICD-10-CM | POA: Diagnosis not present

## 2020-04-26 DIAGNOSIS — D500 Iron deficiency anemia secondary to blood loss (chronic): Secondary | ICD-10-CM | POA: Diagnosis not present

## 2020-04-26 MED ORDER — BENZONATATE 100 MG PO CAPS: 100.0000 mg | ORAL_CAPSULE | Freq: Three times a day (TID) | ORAL | 1 refills | Status: DC | PRN

## 2020-04-26 MED ORDER — IPRATROPIUM-ALBUTEROL 0.5-2.5 (3) MG/3ML IN SOLN: 3.0000 mL | RESPIRATORY_TRACT | 2 refills | Status: AC | PRN

## 2020-04-26 MED ORDER — ALBUTEROL SULFATE HFA 108 (90 BASE) MCG/ACT IN AERS: 2.0000 | INHALATION_SPRAY | Freq: Four times a day (QID) | RESPIRATORY_TRACT | 2 refills | Status: AC | PRN

## 2020-04-26 NOTE — Patient Instructions (Signed)
Weigh yourself daily

## 2020-04-26 NOTE — Progress Notes (Signed)
Assessment & Plan:  1. Chronic congestive heart failure, unspecified heart failure type ([place]) - Managed by cardiology. Continue current medications. Discussed Coreg not causing shortness of breath as often as Metoprolol, but will leave this decision up to his cardiologist. Continue daily weights. Patient understands to call us or cardiology for weight gain of 3 lbs in a day or 5 lbs in a week.  - furosemide (LASIX) 40 MG tablet; Take 40 mg by mouth daily. - losartan (COZAAR) 50 MG tablet; Take 50 mg by mouth daily. - metoprolol tartrate (LOPRESSOR) 25 MG tablet; Take 2 tablets by mouth in the morning and at bedtime. - CMP14+EGFR - Brain natriuretic peptide - Thyroid Panel With TSH  2. History of GI bleed - Continue Protonix and iron supplement.  - CBC with Differential/Platelet - pantoprazole (PROTONIX) 40 MG tablet; Take 40 mg by mouth daily.  3. Iron deficiency anemia due to chronic blood loss - Continue iron supplement.  - CBC with Differential/Platelet - Ferrous Sulfate (IRON PO); Take by mouth.  4. Hypomagnesemia - Magnesium  5. Chronic obstructive pulmonary disease, unspecified COPD type ([place]) - ipratropium-albuterol (DUONEB) 0.5-2.5 (3) MG/3ML SOLN; Take 3 mLs by nebulization every 4 (four) hours as needed.  Dispense: 360 mL; Refill: 2 - albuterol (VENTOLIN HFA) 108 (90 Base) MCG/ACT inhaler; Inhale 2 puffs into the lungs every 6 (six) hours as needed.  Dispense: 18 g; Refill: 2  6. Cough - benzonatate (TESSALON PERLES) 100 MG capsule; Take 1 capsule (100 mg total) by mouth 3 (three) times daily as needed for cough.  Dispense: 30 capsule; Refill: 1  7. History of recent pneumonia  8. Shortness of breath - Discussed his shortness of breath could be a combination from COPD, recent pneumonia, CHF, and anemia. Recommended inhalers, lab work, and follow-up with cardiology.  - ipratropium-albuterol (DUONEB) 0.5-2.5 (3) MG/3ML SOLN; Take 3 mLs by nebulization every 4 (four)  hours as needed.  Dispense: 360 mL; Refill: 2 - albuterol (VENTOLIN HFA) 108 (90 Base) MCG/ACT inhaler; Inhale 2 puffs into the lungs every 6 (six) hours as needed.  Dispense: 18 g; Refill: 2   Follow up plan: No follow-ups on file.  Adam Limes, MSN, APRN, FNP-C [facility]  Subjective:   Patient ID: Adam Watts, male    DOB: 1951-02-05, 69 y.o.   MRN: [id number]  HPI: Adam Watts is a 69 y.o. male presenting on 04/26/2020 for Hospitalization Follow-up (9/20- [facility]- CHF & [facility]E) and Shortness of Breath (Patient states that it is no better since he was at the hospital.)  Patient is here for a hospital follow-up.  He has had multiple hospitalizations recently.  He is accompanied by his wife today who he is okay with being present.  The first hospitalization at [facility] was from 02/23/2020 - 03/03/2020 due to pneumonia with COPD exacerbation.  Patient was treated with antibiotics, steroids, and supplemental oxygen.  He was discharged home with home health, oxygen, and 3 additional days of antibiotics and steroids.  While patient was hospitalized he underwent EGD for evaluation of iron deficiency anemia.  The exam was limited, so it was recommended patient have a repeat EGD with colonoscopy as an outpatient.  Patient was seen at the ER on 04/06/2020 due to foot pain, but was noted to be tachypneic with a CHF exacerbation.  He was admitted for overnight observation, diuresed with IV Lasix, and evaluated by cardiology.  His losartan was decreased to 50 mg, metoprolol  50 mg twice daily and Lasix 20 mg daily were added.  He was treated with prednisone for a gout flare.  Patient was advised to follow-up with cardiology back here in [place].  Patient was admitted again to [facility] from 04/17/2020 - 04/20/2020 due to CHF exacerbation and GI bleed.  EGD and colonoscopy performed revealing Scott she is ring that  was dilated and an AVM that was cauterized.  Patient was started on pantoprazole 40 mg once daily and his Lasix was increased to 40 mg once daily.  Patient's main concern today is his shortness of breath.  He feels it has been worse since he was started on metoprolol.  He also reports he was unable to get the DuoNeb nebulizer solution.  He has been weighing himself daily and has been fairly stable per his report.  He has been taking medications as they were prescribed in the hospital.   ROS: Negative unless specifically indicated above in HPI.   Relevant past medical history reviewed and updated as indicated.   Allergies and medications reviewed and updated.   Current Outpatient Medications:  .  CINNAMON PO, Take 2,000 mg by mouth daily. , Disp: , Rfl:  .  diltiazem (TIAZAC) 360 MG 24 hr capsule, TAKE 1 CAPSULE BY MOUTH EVERY DAY, Disp: 90 capsule, Rfl: 1 .  furosemide (LASIX) 40 MG tablet, Take 40 mg by mouth daily., Disp: , Rfl:  .  golimumab (SIMPONI ARIA) 50 MG/4ML SOLN injection, Inject into the vein every 8 (eight) weeks., Disp: , Rfl:  .  loratadine (CLARITIN) 10 MG tablet, Take 10 mg by mouth daily., Disp: , Rfl:  .  losartan (COZAAR) 50 MG tablet, Take 50 mg by mouth daily., Disp: , Rfl:  .  metFORMIN (GLUCOPHAGE-XR) 500 MG 24 hr tablet, Take 1 tablet (500 mg total) by mouth 2 (two) times daily with a meal., Disp: 180 tablet, Rfl: 1 .  metoprolol tartrate (LOPRESSOR) 25 MG tablet, Take 2 tablets by mouth in the morning and at bedtime., Disp: , Rfl:  .  mirtazapine (REMERON) 15 MG tablet, Take 1 tablet (15 mg total) by mouth at bedtime., Disp: 90 tablet, Rfl: 1 .  Multiple Vitamins-Minerals (MEGA MULTIVITAMIN FOR MEN PO), Take 1 tablet by mouth daily. , Disp: , Rfl:  .  rivaroxaban (XARELTO) 20 MG TABS tablet, TAKE 1 TABLET BY MOUTH DAILY WITH SUPPER, Disp: 90 tablet, Rfl: 1 No current facility-administered medications for this visit.  Facility-Administered Medications Ordered in  Other Visits:  .  ondansetron (ZOFRAN) 4 mg in sodium chloride 0.9 % 50 mL IVPB, 4 mg, Intravenous, Q6H PRN, Ashok Pall, MD  No Known Allergies  Objective:   BP 102/70   Pulse 82   Temp 97.9 F (36.6 C) (Temporal)   Ht _0  (1.676 m)   Wt 176 lb 9.6 oz (80.1 kg)   SpO2 94%   BMI 28.50 kg/m    Physical Exam Vitals reviewed.  Constitutional:      General: He is not in acute distress.    Appearance: Normal appearance. He is overweight. He is not ill-appearing, toxic-appearing or diaphoretic.  HENT:     Head: Normocephalic and atraumatic.  Eyes:     General: No scleral icterus.       Right eye: No discharge.        Left eye: No discharge.     Conjunctiva/sclera: Conjunctivae normal.  Cardiovascular:     Rate and Rhythm: Normal rate and regular  rhythm.     Heart sounds: Normal heart sounds. No murmur heard.  No friction rub. No gallop.   Pulmonary:     Effort: Pulmonary effort is normal. No respiratory distress.     Breath sounds: Normal breath sounds. No stridor. No wheezing, rhonchi or rales.     Comments: Patient gets short of breath with minimal exertion. Musculoskeletal:        General: Normal range of motion.     Cervical back: Normal range of motion.  Skin:    General: Skin is warm and dry.  Neurological:     Mental Status: He is alert and oriented to person, place, and time. Mental status is at baseline.  Psychiatric:        Mood and Affect: Mood normal.        Behavior: Behavior normal.        Thought Content: Thought content normal.        Judgment: Judgment normal.

## 2020-04-27 ENCOUNTER — Encounter: Payer: Self-pay | Admitting: Internal Medicine

## 2020-04-27 ENCOUNTER — Ambulatory Visit (INDEPENDENT_AMBULATORY_CARE_PROVIDER_SITE_OTHER): Payer: Medicare Other | Admitting: Internal Medicine

## 2020-04-27 VITALS — BP 116/62 | HR 89 | Ht 66.0 in | Wt 176.4 lb

## 2020-04-27 DIAGNOSIS — I10 Essential (primary) hypertension: Secondary | ICD-10-CM

## 2020-04-27 DIAGNOSIS — J189 Pneumonia, unspecified organism: Secondary | ICD-10-CM | POA: Diagnosis not present

## 2020-04-27 LAB — CMP14+EGFR
ALT: 8 IU/L (ref 0–44)
AST: 13 IU/L (ref 0–40)
Albumin/Globulin Ratio: 1.4 (ref 1.2–2.2)
Albumin: 3.7 g/dL — ABNORMAL LOW (ref 3.8–4.8)
Alkaline Phosphatase: 114 IU/L (ref 44–121)
BUN/Creatinine Ratio: 10 (ref 10–24)
BUN: 13 mg/dL (ref 8–27)
Bilirubin Total: 0.2 mg/dL (ref 0.0–1.2)
CO2: 26 mmol/L (ref 20–29)
Calcium: 9.2 mg/dL (ref 8.6–10.2)
Chloride: 99 mmol/L (ref 96–106)
Creatinine, Ser: 1.26 mg/dL (ref 0.76–1.27)
GFR calc Af Amer: 66 mL/min/{1.73_m2} (ref >59)
GFR calc non Af Amer: 57 mL/min/{1.73_m2} — ABNORMAL LOW (ref >59)
Globulin, Total: 2.7 g/dL (ref 1.5–4.5)
Glucose: 206 mg/dL — ABNORMAL HIGH (ref 65–99)
Potassium: 3.6 mmol/L (ref 3.5–5.2)
Sodium: 141 mmol/L (ref 134–144)
Total Protein: 6.4 g/dL (ref 6.0–8.5)

## 2020-04-27 LAB — CBC WITH DIFFERENTIAL/PLATELET
Basophils Absolute: 0.1 10*3/uL (ref 0.0–0.2)
Basos: 1 %
EOS (ABSOLUTE): 0.6 10*3/uL — ABNORMAL HIGH (ref 0.0–0.4)
Eos: 7 %
Hematocrit: 32.3 % — ABNORMAL LOW (ref 37.5–51.0)
Hemoglobin: 9.3 g/dL — ABNORMAL LOW (ref 13.0–17.7)
Immature Grans (Abs): 0 10*3/uL (ref 0.0–0.1)
Immature Granulocytes: 0 %
Lymphocytes Absolute: 1.5 10*3/uL (ref 0.7–3.1)
Lymphs: 18 %
MCH: 22.1 pg — ABNORMAL LOW (ref 26.6–33.0)
MCHC: 28.8 g/dL — ABNORMAL LOW (ref 31.5–35.7)
MCV: 77 fL — ABNORMAL LOW (ref 79–97)
Monocytes Absolute: 0.8 10*3/uL (ref 0.1–0.9)
Monocytes: 10 %
Neutrophils Absolute: 5.3 10*3/uL (ref 1.4–7.0)
Neutrophils: 64 %
Platelets: 402 10*3/uL (ref 150–450)
RBC: 4.21 x10E6/uL (ref 4.14–5.80)
RDW: 22.8 % — ABNORMAL HIGH (ref 11.6–15.4)
WBC: 8.4 10*3/uL (ref 3.4–10.8)

## 2020-04-27 LAB — BRAIN NATRIURETIC PEPTIDE: BNP: 223.2 pg/mL — ABNORMAL HIGH (ref 0.0–100.0)

## 2020-04-27 LAB — THYROID PANEL WITH TSH
Free Thyroxine Index: 1.4 (ref 1.2–4.9)
T3 Uptake Ratio: 32 % (ref 24–39)
T4, Total: 4.4 ug/dL — ABNORMAL LOW (ref 4.5–12.0)
TSH: 3.61 u[IU]/mL (ref 0.450–4.500)

## 2020-04-27 LAB — MAGNESIUM: Magnesium: 1.5 mg/dL — ABNORMAL LOW (ref 1.6–2.3)

## 2020-04-27 NOTE — Telephone Encounter (Signed)
Call placed to pharmacy--> Patient picked up medication on cash price of $40  If you think appeal needs to be done, please forward paperwork to me  Thanks! Almyra Free

## 2020-04-27 NOTE — Patient Instructions (Signed)
Medication Instructions:  Your physician recommends that you continue on your current medications as directed. Please refer to the Current Medication list given to you today.  *If you need a refill on your cardiac medications before your next appointment, please call your pharmacy*   Lab Work: NONE   If you have labs (blood work) drawn today and your tests are completely normal, you will receive your results only by: MyChart Message (if you have MyChart) OR A paper copy in the mail If you have any lab test that is abnormal or we need to change your treatment, we will call you to review the results.   Testing/Procedures: NONE    Follow-Up: At [facility], you and your health needs are our priority.  As part of our continuing mission to provide you with exceptional heart care, we have created designated Provider Care Teams.  These Care Teams include your primary Cardiologist (physician) and Advanced Practice Providers (APPs -  Physician Assistants and Nurse Practitioners) who all work together to provide you with the care you need, when you need it.  We recommend signing up for the patient portal called "MyChart".  Sign up information is provided on this After Visit Summary.  MyChart is used to connect with patients for Virtual Visits (Telemedicine).  Patients are able to view lab/test results, encounter notes, upcoming appointments, etc.  Non-urgent messages can be sent to your provider as well.   To learn more about what you can do with MyChart, go to [email or web address].    Your next appointment:   6 month(s)  The format for your next appointment:   In Person  Provider:   Gregg Taylor, MD   Other Instructions Thank you for choosing [facility]!    

## 2020-04-27 NOTE — Telephone Encounter (Signed)
Adam Watts - this is the gentleman with COPD that was also recently treated for bilateral pneumonia.

## 2020-04-27 NOTE — Progress Notes (Signed)
HPI Adam Watts returns today for ongoing evaluation and management of his CHB, PAF, s/p PPM insertion. He has HTN. [place]. He denies chest pain. No syncope. He was placed on lasix. He denies palpitations. No Known Allergies   Current Outpatient Medications  Medication Sig Dispense Refill  . albuterol (VENTOLIN HFA) 108 (90 Base) MCG/ACT inhaler Inhale 2 puffs into the lungs every 6 (six) hours as needed. 18 g 2  . benzonatate (TESSALON PERLES) 100 MG capsule Take 1 capsule (100 mg total) by mouth 3 (three) times daily as needed for cough. 30 capsule 1  . CINNAMON PO Take 2,000 mg by mouth daily.     Marland Kitchen diltiazem (TIAZAC) 360 MG 24 hr capsule TAKE 1 CAPSULE BY MOUTH EVERY DAY 90 capsule 1  . furosemide (LASIX) 40 MG tablet Take 40 mg by mouth daily.    Marland Kitchen golimumab (SIMPONI ARIA) 50 MG/4ML SOLN injection Inject into the vein every 8 (eight) weeks.    Marland Kitchen ipratropium-albuterol (DUONEB) 0.5-2.5 (3) MG/3ML SOLN Take 3 mLs by nebulization every 4 (four) hours as needed. 360 mL 2  . loratadine (CLARITIN) 10 MG tablet Take 10 mg by mouth daily.    Marland Kitchen losartan (COZAAR) 50 MG tablet Take 50 mg by mouth daily.    . metFORMIN (GLUCOPHAGE-XR) 500 MG 24 hr tablet Take 1 tablet (500 mg total) by mouth 2 (two) times daily with a meal. 180 tablet 1  . metoprolol tartrate (LOPRESSOR) 25 MG tablet Take 2 tablets by mouth in the morning and at bedtime.    . mirtazapine (REMERON) 15 MG tablet Take 1 tablet (15 mg total) by mouth at bedtime. 90 tablet 1  . Multiple Vitamins-Minerals (MEGA MULTIVITAMIN FOR MEN PO) Take 1 tablet by mouth daily.     . rivaroxaban (XARELTO) 20 MG TABS tablet TAKE 1 TABLET BY MOUTH DAILY WITH SUPPER 90 tablet 1   No current facility-administered medications for this visit.   Facility-Administered Medications Ordered in Other Visits  Medication Dose Route Frequency Provider Last Rate Last Admin  . ondansetron (ZOFRAN) 4 mg in  sodium chloride 0.9 % 50 mL IVPB  4 mg Intravenous Q6H PRN Ashok Pall, MD         Past Medical History:  Diagnosis Date  . A-fib ([place])   . Chest pain   . Chronic back pain   . Dysrhythmia    a-fib  . Fracture of lateral malleolus 02/11/2015  . Gastro-esophageal reflux disease with esophagitis   . Hyperlipidemia, unspecified   . Hypertension   . Insomnia, unspecified   . Lumbar radiculopathy   . Nicotine dependence, unspecified, uncomplicated   . Pacemaker    CHB, PAF  . Pain in left shoulder   . Pain in right leg   . Pneumonia   . Pre-diabetes   . Presence of cardiac pacemaker   . Rheumatoid arthritis, unspecified ([place])   . Type 2 diabetes mellitus without complications ([place])   . Unilateral inguinal hernia, without obstruction or gangrene, not specified as recurrent     ROS:   All systems reviewed and negative except as noted in the HPI.   Past Surgical History:  Procedure Laterality Date  . CHOLECYSTECTOMY    . DENTAL SURGERY     implants  . INGUINAL HERNIA REPAIR Bilateral 04/24/2016   Procedure: LAPAROSCOPIC BILATERAL INGUINAL HERNIA REPAIR;  Surgeon: Coralie Keens, MD;  Location: [facility];  Service: General;  Laterality: Bilateral;  . INSERTION OF MESH Bilateral 04/24/2016   Procedure: INSERTION OF MESH;  Surgeon: Coralie Keens, MD;  Location: [facility];  Service: General;  Laterality: Bilateral;  . LUMBAR LAMINECTOMY/DECOMPRESSION MICRODISCECTOMY Left 04/10/2018   Procedure: Left Lumbar two-three Laminectomy/Foraminotomy;  Surgeon: Ashok Pall, MD;  Location: [facility];  Service: Neurosurgery;  Laterality: Left;  . REVERSE SHOULDER ARTHROPLASTY Left 02/24/2019   Procedure: REVERSE TOTAL SHOULDER ARTHROPLASTY;  Surgeon: Hiram Gash, MD;  Location: [facility];  Service: Orthopedics;  Laterality: Left;  . SHOULDER ARTHROSCOPY Right    x2  . VASECTOMY       Family History  Problem Relation Age of Onset  . Hypertension Mother     . Hypertension Father      Social History   Socioeconomic History  . Marital status: Married    Spouse name: Not on file  . Number of children: Not on file  . Years of education: Not on file  . Highest education level: Not on file  Occupational History  . Not on file  Tobacco Use  . Smoking status: Current Every Day Smoker    Packs/day: 1.00  . Smokeless tobacco: Never Used  Vaping Use  . Vaping Use: Never used  Substance and Sexual Activity  . Alcohol use: Yes    Comment: social  . Drug use: No  . Sexual activity: Yes    Birth control/protection: None  Other Topics Concern  . Not on file  Social History Narrative  . Not on file   Social Determinants of Health   Financial Resource Strain:   . Difficulty of Paying Living Expenses: Not on file  Food Insecurity:   . Worried About Charity fundraiser in the Last Year: Not on file  . Ran Out of Food in the Last Year: Not on file  Transportation Needs:   . Lack of Transportation (Medical): Not on file  . Lack of Transportation (Non-Medical): Not on file  Physical Activity:   . Days of Exercise per Week: Not on file  . Minutes of Exercise per Session: Not on file  Stress:   . Feeling of Stress : Not on file  Social Connections:   . Frequency of Communication with Friends and Family: Not on file  . Frequency of Social Gatherings with Friends and Family: Not on file  . Attends Religious Services: Not on file  . Active Member of Clubs or Organizations: Not on file  . Attends Archivist Meetings: Not on file  . Marital Status: Not on file  Intimate Partner Violence:   . Fear of Current or Ex-Partner: Not on file  . Emotionally Abused: Not on file  . Physically Abused: Not on file  . Sexually Abused: Not on file     BP 116/62   Pulse 89   Ht 5\' 6"  (1.676 m)   Wt 176 lb 6.4 oz (80 kg)   SpO2 (!) 88%   BMI 28.47 kg/m   Physical Exam:  Well appearing NAD HEENT: Unremarkable Neck:  No JVD, no  thyromegally Lymphatics:  No adenopathy Back:  No CVA tenderness Lungs:  Clear with no wheezes HEART:  Regular rate rhythm, no murmurs, no rubs, no clicks Abd:  soft, positive bowel sounds, no organomegally, no rebound, no guarding Ext:  2 plus pulses, no edema, no cyanosis, no clubbing Skin:  No rashes no nodules Neuro:  CN II through XII intact, motor grossly intact  EKG -  atrial fib with ventricular pacing  DEVICE  Normal device function.  See PaceArt for details.   Assess/Plan: 1. Atrial fib - his VR is well controlled. He will continue his beta blocker 2. Chronic distolic heart failure - he will continue lasix. I strongly encouraged him to avoid sodium. 3. Recurrent pneumonia/COPD/Tobacco abuse - He has baseline oxygen sats in the low 90's and high 80's. I suspect he is desaturating with acitivity.  4. 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 We will recheck in several months.  Carleene Overlie Feleica Fulmore,MD

## 2020-04-27 NOTE — Telephone Encounter (Signed)
Denied today [id number];EXNTZG:YFVCBS;Review Type:Prior Auth;Appeal Information: [place] [id number]. [phone] Phone:[phone] Fax:[phone] WebAddress:[email or web address]; Important - Please read the below note on eAppeals: Please reference the denial letter for information on the rights for an appeal, rationale for the denial, and how to submit an appeal including if any information is needed to support the appeal. Note about urgent situations - Generally, an urgent situation is one which, in the opinion of the provider, the health of the patient may be in serious jeopardy or may experience pain that cannot be adequately controlled while waiting for a decision on the appeal.;  No alternative given

## 2020-04-28 ENCOUNTER — Ambulatory Visit (INDEPENDENT_AMBULATORY_CARE_PROVIDER_SITE_OTHER): Payer: Medicare Other | Admitting: Nurse Practitioner

## 2020-04-28 DIAGNOSIS — R059 Cough, unspecified: Secondary | ICD-10-CM | POA: Insufficient documentation

## 2020-04-28 MED ORDER — DELSYM COUGH/CHEST CONGEST DM 5-100 MG/5ML PO LIQD: ORAL | 1 refills | Status: DC

## 2020-04-28 NOTE — Addendum Note (Signed)
Addended by: Ivy Lynn on: 04/28/2020 12:11 PM   Modules accepted: Level of Service

## 2020-04-28 NOTE — Assessment & Plan Note (Signed)
Patient is a 69 year old male who presents for cough via virtual televisit.  Patient was treated for cough last week.  Patient is reporting worsening cough with unresolved symptoms.  Provided education to patient advised patient to continue inhalers, take medication as prescribed.  Started patient on Delsym DM for cough and congestion.  Advised patient to increase hydration and use a humidifier. Follow-up with worsening or unresolved symptoms.

## 2020-04-28 NOTE — Progress Notes (Signed)
   Virtual Visit via telephone Note Due to COVID-19 pandemic this visit was conducted virtually. This visit type was conducted due to national recommendations for restrictions regarding the COVID-19 Pandemic (e.g. social distancing, sheltering in place) in an effort to limit this patient's exposure and mitigate transmission in our community. All issues noted in this document were discussed and addressed.  A physical exam was not performed with this format.  I connected with Adam Watts on 04/28/20 at 11:09 by telephone and verified that I am speaking with the correct person using two identifiers. Adam Watts is currently located at home and no one is currently with patient during visit. The provider, Ivy Lynn, NP is located in their office at time of visit.  I discussed the limitations, risks, security and privacy concerns of performing an evaluation and management service by telephone and the availability of in person appointments. I also discussed with the patient that there may be a patient responsible charge related to this service. The patient expressed understanding and agreed to proceed.   History and Present Illness:  Cough This is a recurrent problem. The current episode started 1 to 4 weeks ago. The problem has been gradually worsening. The problem occurs constantly. The cough is non-productive. Associated symptoms include headaches and shortness of breath. Pertinent negatives include no chest pain or fever. Nothing (recent hospital admission for PNA) aggravates the symptoms. He has tried prescription cough suppressant for the symptoms. The treatment provided no relief.      Review of Systems  Constitutional: Negative for fever.  Respiratory: Positive for cough and shortness of breath.   Cardiovascular: Negative for chest pain.  Neurological: Positive for headaches.  All other systems reviewed and are negative.    Observations/Objective: Virtual tele  visit  Assessment and Plan: Cough Patient is a 69 year old male who presents for cough via virtual televisit.  Patient was treated for cough last week.  Patient is reporting worsening cough with unresolved symptoms.  Provided education to patient advised patient to continue inhalers, take medication as prescribed.  Started patient on Delsym DM for cough and congestion.  Advised patient to increase hydration and use a humidifier. Follow-up with worsening or unresolved symptoms.   Follow Up Instructions: Follow up with worsening or unresolved symptom    I discussed the assessment and treatment plan with the patient. The patient was provided an opportunity to ask questions and all were answered. The patient agreed with the plan and demonstrated an understanding of the instructions.   The patient was advised to call back or seek an in-person evaluation if the symptoms worsen or if the condition fails to improve as anticipated.  The above assessment and management plan was discussed with the patient. The patient verbalized understanding of and has agreed to the management plan. Patient is aware to call the clinic if symptoms persist or worsen. Patient is aware when to return to the clinic for a follow-up visit. Patient educated on when it is appropriate to go to the emergency department.   Time call ended:  11:16 am  I provided 6  minutes of non-face-to-face time during this encounter.    Ivy Lynn, NP

## 2020-05-01 ENCOUNTER — Encounter: Payer: Self-pay | Admitting: Family Medicine

## 2020-05-01 DIAGNOSIS — Z8719 Personal history of other diseases of the digestive system: Secondary | ICD-10-CM | POA: Insufficient documentation

## 2020-05-01 DIAGNOSIS — J449 Chronic obstructive pulmonary disease, unspecified: Secondary | ICD-10-CM | POA: Insufficient documentation

## 2020-05-02 DIAGNOSIS — Z6828 Body mass index (BMI) 28.0-28.9, adult: Secondary | ICD-10-CM | POA: Diagnosis not present

## 2020-05-02 DIAGNOSIS — I509 Heart failure, unspecified: Secondary | ICD-10-CM | POA: Diagnosis not present

## 2020-05-02 DIAGNOSIS — D508 Other iron deficiency anemias: Secondary | ICD-10-CM | POA: Diagnosis not present

## 2020-05-02 DIAGNOSIS — K922 Gastrointestinal hemorrhage, unspecified: Secondary | ICD-10-CM | POA: Diagnosis not present

## 2020-05-02 DIAGNOSIS — M150 Primary generalized (osteo)arthritis: Secondary | ICD-10-CM | POA: Diagnosis not present

## 2020-05-02 DIAGNOSIS — E663 Overweight: Secondary | ICD-10-CM | POA: Diagnosis not present

## 2020-05-02 DIAGNOSIS — R0602 Shortness of breath: Secondary | ICD-10-CM | POA: Diagnosis not present

## 2020-05-02 DIAGNOSIS — M0579 Rheumatoid arthritis with rheumatoid factor of multiple sites without organ or systems involvement: Secondary | ICD-10-CM | POA: Diagnosis not present

## 2020-05-02 DIAGNOSIS — Z79899 Other long term (current) drug therapy: Secondary | ICD-10-CM | POA: Diagnosis not present

## 2020-05-02 DIAGNOSIS — M2550 Pain in unspecified joint: Secondary | ICD-10-CM | POA: Diagnosis not present

## 2020-05-02 DIAGNOSIS — M5417 Radiculopathy, lumbosacral region: Secondary | ICD-10-CM | POA: Diagnosis not present

## 2020-05-02 DIAGNOSIS — I501 Left ventricular failure: Secondary | ICD-10-CM | POA: Diagnosis not present

## 2020-05-04 DIAGNOSIS — J189 Pneumonia, unspecified organism: Secondary | ICD-10-CM | POA: Diagnosis not present

## 2020-05-05 DIAGNOSIS — Z7901 Long term (current) use of anticoagulants: Secondary | ICD-10-CM | POA: Diagnosis not present

## 2020-05-05 DIAGNOSIS — D500 Iron deficiency anemia secondary to blood loss (chronic): Secondary | ICD-10-CM | POA: Diagnosis not present

## 2020-05-05 DIAGNOSIS — K5790 Diverticulosis of intestine, part unspecified, without perforation or abscess without bleeding: Secondary | ICD-10-CM | POA: Diagnosis not present

## 2020-05-05 DIAGNOSIS — K5520 Angiodysplasia of colon without hemorrhage: Secondary | ICD-10-CM | POA: Diagnosis not present

## 2020-05-05 DIAGNOSIS — D123 Benign neoplasm of transverse colon: Secondary | ICD-10-CM | POA: Diagnosis not present

## 2020-05-29 DIAGNOSIS — R0602 Shortness of breath: Secondary | ICD-10-CM | POA: Diagnosis not present

## 2020-05-29 DIAGNOSIS — M5417 Radiculopathy, lumbosacral region: Secondary | ICD-10-CM | POA: Diagnosis not present

## 2020-05-29 DIAGNOSIS — M150 Primary generalized (osteo)arthritis: Secondary | ICD-10-CM | POA: Diagnosis not present

## 2020-05-29 DIAGNOSIS — M2550 Pain in unspecified joint: Secondary | ICD-10-CM | POA: Diagnosis not present

## 2020-05-29 DIAGNOSIS — I501 Left ventricular failure: Secondary | ICD-10-CM | POA: Diagnosis not present

## 2020-05-29 DIAGNOSIS — Z79899 Other long term (current) drug therapy: Secondary | ICD-10-CM | POA: Diagnosis not present

## 2020-05-29 DIAGNOSIS — D508 Other iron deficiency anemias: Secondary | ICD-10-CM | POA: Diagnosis not present

## 2020-05-29 DIAGNOSIS — E663 Overweight: Secondary | ICD-10-CM | POA: Diagnosis not present

## 2020-05-29 DIAGNOSIS — Z6826 Body mass index (BMI) 26.0-26.9, adult: Secondary | ICD-10-CM | POA: Diagnosis not present

## 2020-05-29 DIAGNOSIS — I509 Heart failure, unspecified: Secondary | ICD-10-CM | POA: Diagnosis not present

## 2020-05-29 DIAGNOSIS — M0579 Rheumatoid arthritis with rheumatoid factor of multiple sites without organ or systems involvement: Secondary | ICD-10-CM | POA: Diagnosis not present

## 2020-05-29 DIAGNOSIS — K922 Gastrointestinal hemorrhage, unspecified: Secondary | ICD-10-CM | POA: Diagnosis not present

## 2020-05-31 ENCOUNTER — Other Ambulatory Visit: Payer: Self-pay

## 2020-05-31 ENCOUNTER — Ambulatory Visit (INDEPENDENT_AMBULATORY_CARE_PROVIDER_SITE_OTHER): Payer: Medicare Other | Admitting: Internal Medicine

## 2020-05-31 ENCOUNTER — Encounter: Payer: Self-pay | Admitting: Internal Medicine

## 2020-05-31 ENCOUNTER — Ambulatory Visit (HOSPITAL_COMMUNITY)
Admission: RE | Admit: 2020-05-31 | Discharge: 2020-05-31 | Disposition: A | Discharge status: Home / Self Care | Payer: Medicare Other | Source: Ambulatory Visit | Attending: Internal Medicine | Admitting: Internal Medicine

## 2020-05-31 DIAGNOSIS — R0600 Dyspnea, unspecified: Secondary | ICD-10-CM

## 2020-05-31 DIAGNOSIS — I10 Essential (primary) hypertension: Secondary | ICD-10-CM | POA: Diagnosis not present

## 2020-05-31 DIAGNOSIS — J811 Chronic pulmonary edema: Secondary | ICD-10-CM | POA: Diagnosis not present

## 2020-05-31 DIAGNOSIS — R0609 Other forms of dyspnea: Secondary | ICD-10-CM

## 2020-05-31 DIAGNOSIS — J439 Emphysema, unspecified: Secondary | ICD-10-CM | POA: Diagnosis not present

## 2020-05-31 NOTE — Patient Instructions (Addendum)
Make sure you check your oxygen saturations at highest level of activity to be sure it stays over 90% and keep track of it at least once a week, more often if breathing getting worse, and let me know if losing ground.    Only use your albuterol as a rescue medication to be used if you can't catch your breath by resting or doing a relaxed purse lip breathing pattern.  - The less you use it, the better it will work when you need it. - Ok to use up to 2 puffs  every 4 hours if you must but call for immediate appointment if use goes up over your usual need - Don't leave home without it !!  (think of it like the spare tire for your car)   Please remember to go to the  x-ray department  @  [facility] for your tests - we will call you with the results when they are available      Please schedule a follow up office visit in 6 weeks  PFTs on return

## 2020-05-31 NOTE — Progress Notes (Signed)
Adam Watts, male    DOB: Oct 28, 1950,   MRN: [id number]   Brief patient profile:  69yowm quit smoking 01/2020 with RA severe  But able weed eat  [place]  in July 2021 w/in 24h of this activity sob > ER  with dx of "double pna" with 3 admits at [facility] with dx of ? chf ? Copd ? pna some better since  Last d/c variably on prednisone for flare of RA but no 02 or resp rx needed.  referred to pulmonary clinic in [place]  05/31/2020 by Dr    Crissie Sickles     History of Present Illness  05/31/2020  Pulmonary/ 1st office eval/ Adam Watts / [facility]  Chief Complaint  Patient presents with  . Consult    shortness of breath with activity  Dyspnea:  Walked to lawyers office uphill today x 2 min @ slow pce Don't need  HC parking/ can do food stores slowly more limited by back pain Cough: rarely now  Sleep: bed is flat/ 2 pillows  SABA use: has hfa and neb saba but not using  No obvious day to day or daytime variability or assoc excess/ purulent sputum or mucus plugs or hemoptysis or cp or chest tightness, subjective wheeze or overt sinus or hb symptoms.   Sleeping as ab ove without nocturnal  or early am exacerbation  of respiratory  c/o's or need for noct saba. Also denies any obvious fluctuation of symptoms with weather or environmental changes or other aggravating or alleviating factors except as outlined above   No unusual exposure hx or h/o childhood pna/ asthma or knowledge of premature birth.  Current Allergies, Complete Past Medical History, Past Surgical History, Family History, and Social History were reviewed in [employer] record.  ROS  The following are not active complaints unless bolded Hoarseness, sore throat, dysphagia, dental problems, itching, sneezing,  nasal congestion or discharge of excess mucus or purulent secretions, ear ache,   fever, chills, sweats, unintended wt loss or wt gain, classically pleuritic or exertional cp,   orthopnea pnd or arm/hand swelling  or leg swelling, presyncope, palpitations, abdominal pain, anorexia, nausea, vomiting, diarrhea  or change in bowel habits or change in bladder habits, change in stools or change in urine, dysuria, hematuria,  rash, arthralgias, visual complaints, headache, numbness, weakness or ataxia or problems with walking or coordination,  change in mood or  memory.             Past Medical History:  Diagnosis Date  . A-fib ([place])   . Chest pain   . Chronic back pain   . Dysrhythmia    a-fib  . Fracture of lateral malleolus 02/11/2015  . Gastro-esophageal reflux disease with esophagitis   . Hyperlipidemia, unspecified   . Hypertension   . Insomnia, unspecified   . Lumbar radiculopathy   . Nicotine dependence, unspecified, uncomplicated   . Pacemaker    CHB, PAF  . Pain in left shoulder   . Pain in right leg   . Pneumonia   . Pre-diabetes   . Presence of cardiac pacemaker   . Rheumatoid arthritis, unspecified ([place])   . Type 2 diabetes mellitus without complications ([place])   . Unilateral inguinal hernia, without obstruction or gangrene, not specified as recurrent     Outpatient Medications Prior to Visit - - NOTE:   Unable to verify as accurately reflecting what pt takes     Medication Sig Dispense  Refill  . albuterol (VENTOLIN HFA) 108 (90 Base) MCG/ACT inhaler Inhale 2 puffs into the lungs every 6 (six) hours as needed. 18 g 2  . CINNAMON PO Take 2,000 mg by mouth daily.     Marland Kitchen diltiazem (TIAZAC) 360 MG 24 hr capsule TAKE 1 CAPSULE BY MOUTH EVERY DAY 90 capsule 1  . Ferrous Sulfate (IRON PO) Take by mouth.    . fexofenadine (ALLEGRA) 60 MG tablet Take 60 mg by mouth daily.    . furosemide (LASIX) 40 MG tablet Take 40 mg by mouth daily.    . hydrochlorothiazide (MICROZIDE) 12.5 MG capsule Take 12.5 mg by mouth daily.    Marland Kitchen HYDROcodone-acetaminophen (NORCO/VICODIN) 5-325 MG tablet Take 1 tablet by mouth every 4 (four) hours as needed for moderate pain.    Marland Kitchen  ipratropium-albuterol (DUONEB) 0.5-2.5 (3) MG/3ML SOLN Take 3 mLs by nebulization every 4 (four) hours as needed. 360 mL 2  . leflunomide (ARAVA) 20 MG tablet Take 20 mg by mouth daily.    Marland Kitchen losartan (COZAAR) 100 MG tablet Take 100 mg by mouth daily.    . metFORMIN (GLUCOPHAGE-XR) 500 MG 24 hr tablet Take 1 tablet (500 mg total) by mouth 2 (two) times daily with a meal. 180 tablet 1  . mirtazapine (REMERON) 15 MG tablet Take 1 tablet (15 mg total) by mouth at bedtime. 90 tablet 1  . Multiple Vitamins-Minerals (MEGA MULTIVITAMIN FOR MEN PO) Take 1 tablet by mouth daily.     . predniSONE (DELTASONE) 5 MG tablet Take 5 mg by mouth daily with breakfast.    . rivaroxaban (XARELTO) 20 MG TABS tablet TAKE 1 TABLET BY MOUTH DAILY WITH SUPPER 90 tablet 1  . golimumab (SIMPONI ARIA) 50 MG/4ML SOLN injection  stopped in July 2021 prior to pna    . benzonatate (TESSALON PERLES) 100 MG capsule Take 1 capsule (100 mg total) by mouth 3 (three) times daily as needed for cough. (Patient not taking: Reported on 05/31/2020) 30 capsule 1  . Dextromethorphan-guaiFENesin (DELSYM COUGH/CHEST CONGEST DM) 5-100 MG/5ML LIQD 5 ml by mouth every 8 hours as needed. (Patient not taking: Reported on 05/31/2020) 237 mL 1  . loratadine (CLARITIN) 10 MG tablet Take 10 mg by mouth daily. (Patient not taking: Reported on 05/31/2020)    . losartan (COZAAR) 50 MG tablet Take 50 mg by mouth daily.    . metoprolol tartrate (LOPRESSOR) 25 MG tablet Take 2 tablets by mouth in the morning and at bedtime.    . pantoprazole (PROTONIX) 40 MG tablet Take 40 mg by mouth daily.     Facility-Administered Medications Prior to Visit  Medication Dose Route Frequency Provider Last Rate Last Admin  . ondansetron (ZOFRAN) 4 mg in sodium chloride 0.9 % 50 mL IVPB  4 mg Intravenous Q6H PRN Ashok Pall, MD         Objective:     BP (!) 94/58 (BP Location: Left Arm, Cuff Size: Normal)   Pulse 82   Temp 97.6 F (36.4 C) (Other (Comment)) Comment  (Src): wrist  Ht 5\' 6"  (1.676 m)   Wt 165 lb 9.6 oz (75.1 kg)   SpO2 97% Comment: Room air  BMI 26.73 kg/m   SpO2: 97 % (Room air)   amb wm  Can barely stand up due to back pain or open his hands   Bilateral crackles / mild clubbing    HEENT : pt wearing mask not removed for exam due to covid -19 concerns.  NECK :  without JVD/Nodes/TM/ nl carotid upstrokes bilaterally   LUNGS: no acc muscle use,  Nl contour chest with dry insp crackles bases bilaterally without cough on insp or exp maneuvers and no wheeze on FVC    CV:  RRR  no s3 or murmur or increase in P2, and no edema   ABD:  soft and nontender with nl inspiratory excursion in the supine position. No bruits or organomegaly appreciated, bowel sounds nl  MS:  Nl gait/ ext warm without deformities, calf tenderness, cyanosis  - Mod  Clubbing present  No obvious joint restrictions   SKIN: warm and dry without lesions    NEURO:  alert, approp, nl sensorium with  no motor or cerebellar deficits apparent.      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    Since back on pred now:  Rec: The goal with a chronic steroid dependent illness is always arriving at the lowest effective dose that controls the disease/symptoms and not accepting a set "formula" which is based on statistics or guidelines that don't always take into account patient  variability or the natural hx of the dz in every  individual patient, which may well vary over time.  For now therefore I recommend the patient maintain  The lowest effective dose to control his RA which tends to be more than what is required to treat the BOOP or ILD assoc with RA   >>> f/u with PFT s in 6 weeks but must return with all meds in hand using a trust but verify approach to confirm accurate Medication  Reconciliation The principal here is that until we are certain that the  patients are doing what we've asked, it makes no sense to ask them to do more.   In meantime advised:  Make sure you check your oxygen saturations at highest level of activity to be sure it stays over 90% and keep track of it at least once a week, more often if breathing getting worse, and let me know if losing ground.           Each maintenance medication was reviewed in detail including emphasizing most importantly the difference between maintenance and prns and under what circumstances the prns are to be triggered using an action plan format where appropriate.  Total time for H and P, chart review, counseling,  directly observing portions of ambulatory 02 saturation study/  and generating customized AVS unique to this office visit / charting > 45 min           Christinia Gully, MD 05/31/2020

## 2020-05-31 NOTE — Assessment & Plan Note (Addendum)
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    Since back on pred now:  Rec: The goal with a chronic steroid dependent illness is always arriving at the lowest effective dose that controls the disease/symptoms and not accepting a set "formula" which is based on statistics or guidelines that don't always take into account patient  variability or the natural hx of the dz in every individual patient, which may well vary over time.  For now therefore I recommend the patient maintain  The lowest effective dose to control his RA which tends to be more than what is required to treat the BOOP or ILD assoc with RA   >>> f/u with PFT s in 6 weeks but must return with all meds in hand using a trust but verify approach to confirm accurate Medication  Reconciliation The principal here is that until we are certain that the  patients are doing what we've asked, it makes no sense to ask them to do more.   In meantime advised:  Make sure you check your oxygen saturations at highest level of activity to be sure it stays over 90% and keep track of it at least once a week, more often if breathing getting worse, and let me know if losing ground.           Each maintenance medication was reviewed in detail including emphasizing most importantly the difference between maintenance and prns and under what circumstances the prns are to be triggered using an action plan format where appropriate.  Total time for H and P, chart review, counseling,  directly observing portions of ambulatory 02 saturation study/  and generating customized AVS unique to this  office visit / charting > 45 min

## 2020-06-01 DIAGNOSIS — Z6826 Body mass index (BMI) 26.0-26.9, adult: Secondary | ICD-10-CM | POA: Diagnosis not present

## 2020-06-01 DIAGNOSIS — Z8719 Personal history of other diseases of the digestive system: Secondary | ICD-10-CM | POA: Diagnosis not present

## 2020-06-01 DIAGNOSIS — I4819 Other persistent atrial fibrillation: Secondary | ICD-10-CM | POA: Diagnosis not present

## 2020-06-01 DIAGNOSIS — Z7901 Long term (current) use of anticoagulants: Secondary | ICD-10-CM | POA: Diagnosis not present

## 2020-06-03 DIAGNOSIS — J189 Pneumonia, unspecified organism: Secondary | ICD-10-CM | POA: Diagnosis not present

## 2020-06-13 ENCOUNTER — Telehealth: Payer: Self-pay

## 2020-06-13 DIAGNOSIS — Z87891 Personal history of nicotine dependence: Secondary | ICD-10-CM | POA: Diagnosis not present

## 2020-06-13 DIAGNOSIS — I2510 Atherosclerotic heart disease of native coronary artery without angina pectoris: Secondary | ICD-10-CM | POA: Diagnosis not present

## 2020-06-13 DIAGNOSIS — Z9114 Patient's other noncompliance with medication regimen: Secondary | ICD-10-CM | POA: Diagnosis not present

## 2020-06-13 DIAGNOSIS — Z9852 Vasectomy status: Secondary | ICD-10-CM | POA: Diagnosis not present

## 2020-06-13 DIAGNOSIS — E1143 Type 2 diabetes mellitus with diabetic autonomic (poly)neuropathy: Secondary | ICD-10-CM | POA: Diagnosis not present

## 2020-06-13 DIAGNOSIS — I4819 Other persistent atrial fibrillation: Secondary | ICD-10-CM | POA: Diagnosis not present

## 2020-06-13 DIAGNOSIS — Z96612 Presence of left artificial shoulder joint: Secondary | ICD-10-CM | POA: Diagnosis not present

## 2020-06-13 DIAGNOSIS — Z7901 Long term (current) use of anticoagulants: Secondary | ICD-10-CM | POA: Diagnosis not present

## 2020-06-13 DIAGNOSIS — Z950 Presence of cardiac pacemaker: Secondary | ICD-10-CM | POA: Diagnosis not present

## 2020-06-13 DIAGNOSIS — K3184 Gastroparesis: Secondary | ICD-10-CM | POA: Diagnosis not present

## 2020-06-13 DIAGNOSIS — E785 Hyperlipidemia, unspecified: Secondary | ICD-10-CM | POA: Diagnosis not present

## 2020-06-13 DIAGNOSIS — I10 Essential (primary) hypertension: Secondary | ICD-10-CM | POA: Diagnosis not present

## 2020-06-13 DIAGNOSIS — Z79899 Other long term (current) drug therapy: Secondary | ICD-10-CM | POA: Diagnosis not present

## 2020-06-13 DIAGNOSIS — I4891 Unspecified atrial fibrillation: Secondary | ICD-10-CM | POA: Diagnosis not present

## 2020-06-13 DIAGNOSIS — E119 Type 2 diabetes mellitus without complications: Secondary | ICD-10-CM | POA: Diagnosis not present

## 2020-06-13 DIAGNOSIS — I5032 Chronic diastolic (congestive) heart failure: Secondary | ICD-10-CM | POA: Diagnosis not present

## 2020-06-13 DIAGNOSIS — J449 Chronic obstructive pulmonary disease, unspecified: Secondary | ICD-10-CM | POA: Diagnosis not present

## 2020-06-13 DIAGNOSIS — M069 Rheumatoid arthritis, unspecified: Secondary | ICD-10-CM | POA: Diagnosis not present

## 2020-06-13 DIAGNOSIS — I480 Paroxysmal atrial fibrillation: Secondary | ICD-10-CM | POA: Diagnosis not present

## 2020-06-14 DIAGNOSIS — E119 Type 2 diabetes mellitus without complications: Secondary | ICD-10-CM | POA: Diagnosis not present

## 2020-06-14 DIAGNOSIS — I4891 Unspecified atrial fibrillation: Secondary | ICD-10-CM | POA: Diagnosis not present

## 2020-06-14 DIAGNOSIS — Z79899 Other long term (current) drug therapy: Secondary | ICD-10-CM | POA: Diagnosis not present

## 2020-06-14 DIAGNOSIS — Z7901 Long term (current) use of anticoagulants: Secondary | ICD-10-CM | POA: Diagnosis not present

## 2020-06-14 DIAGNOSIS — I10 Essential (primary) hypertension: Secondary | ICD-10-CM | POA: Diagnosis not present

## 2020-06-14 DIAGNOSIS — R9431 Abnormal electrocardiogram [ECG] [EKG]: Secondary | ICD-10-CM | POA: Diagnosis not present

## 2020-06-14 DIAGNOSIS — I4819 Other persistent atrial fibrillation: Secondary | ICD-10-CM | POA: Diagnosis not present

## 2020-06-14 DIAGNOSIS — I442 Atrioventricular block, complete: Secondary | ICD-10-CM | POA: Diagnosis not present

## 2020-06-14 DIAGNOSIS — I5032 Chronic diastolic (congestive) heart failure: Secondary | ICD-10-CM | POA: Diagnosis not present

## 2020-06-14 DIAGNOSIS — Z87891 Personal history of nicotine dependence: Secondary | ICD-10-CM | POA: Diagnosis not present

## 2020-06-14 DIAGNOSIS — I110 Hypertensive heart disease with heart failure: Secondary | ICD-10-CM | POA: Diagnosis present

## 2020-06-14 DIAGNOSIS — M069 Rheumatoid arthritis, unspecified: Secondary | ICD-10-CM | POA: Diagnosis not present

## 2020-06-14 DIAGNOSIS — Z9852 Vasectomy status: Secondary | ICD-10-CM | POA: Diagnosis not present

## 2020-06-14 DIAGNOSIS — E785 Hyperlipidemia, unspecified: Secondary | ICD-10-CM | POA: Diagnosis not present

## 2020-06-14 DIAGNOSIS — Z9114 Patient's other noncompliance with medication regimen: Secondary | ICD-10-CM | POA: Diagnosis not present

## 2020-06-14 DIAGNOSIS — Z950 Presence of cardiac pacemaker: Secondary | ICD-10-CM | POA: Diagnosis not present

## 2020-06-14 DIAGNOSIS — K219 Gastro-esophageal reflux disease without esophagitis: Secondary | ICD-10-CM | POA: Diagnosis not present

## 2020-06-14 DIAGNOSIS — J449 Chronic obstructive pulmonary disease, unspecified: Secondary | ICD-10-CM | POA: Diagnosis not present

## 2020-06-14 DIAGNOSIS — Z96612 Presence of left artificial shoulder joint: Secondary | ICD-10-CM | POA: Diagnosis not present

## 2020-06-14 DIAGNOSIS — I2510 Atherosclerotic heart disease of native coronary artery without angina pectoris: Secondary | ICD-10-CM | POA: Diagnosis not present

## 2020-06-19 DIAGNOSIS — I4819 Other persistent atrial fibrillation: Secondary | ICD-10-CM | POA: Diagnosis not present

## 2020-06-26 ENCOUNTER — Ambulatory Visit (INDEPENDENT_AMBULATORY_CARE_PROVIDER_SITE_OTHER): Payer: Medicare Other

## 2020-06-26 DIAGNOSIS — I442 Atrioventricular block, complete: Secondary | ICD-10-CM | POA: Diagnosis not present

## 2020-06-26 LAB — CUP PACEART REMOTE DEVICE CHECK
Battery Impedance: 953 Ohm
Battery Remaining Longevity: 66 mo
Battery Voltage: 2.78 V
Brady Statistic AP VP Percent: 4 %
Brady Statistic AP VS Percent: 5 %
Brady Statistic AS VP Percent: 4 %
Brady Statistic AS VS Percent: 88 %
Date Time Interrogation Session: 20211129093404
Implantable Lead Implant Date: 20140421
Implantable Lead Implant Date: 20140421
Implantable Lead Location: 753859
Implantable Lead Location: 753860
Implantable Lead Model: 5076
Implantable Lead Model: 5592
Implantable Pulse Generator Implant Date: 20140421
Lead Channel Impedance Value: 554 Ohm
Lead Channel Impedance Value: 566 Ohm
Lead Channel Pacing Threshold Amplitude: 0.5 V
Lead Channel Pacing Threshold Amplitude: 0.875 V
Lead Channel Pacing Threshold Pulse Width: 0.4 ms
Lead Channel Pacing Threshold Pulse Width: 0.4 ms
Lead Channel Setting Pacing Amplitude: 1.75 V
Lead Channel Setting Pacing Amplitude: 2 V
Lead Channel Setting Pacing Pulse Width: 0.4 ms
Lead Channel Setting Sensing Sensitivity: 5.6 mV

## 2020-06-27 DIAGNOSIS — R9431 Abnormal electrocardiogram [ECG] [EKG]: Secondary | ICD-10-CM | POA: Diagnosis not present

## 2020-06-28 DIAGNOSIS — Z8719 Personal history of other diseases of the digestive system: Secondary | ICD-10-CM | POA: Diagnosis not present

## 2020-06-28 DIAGNOSIS — I480 Paroxysmal atrial fibrillation: Secondary | ICD-10-CM | POA: Diagnosis not present

## 2020-06-28 DIAGNOSIS — R9431 Abnormal electrocardiogram [ECG] [EKG]: Secondary | ICD-10-CM | POA: Diagnosis not present

## 2020-06-28 DIAGNOSIS — Z950 Presence of cardiac pacemaker: Secondary | ICD-10-CM | POA: Diagnosis not present

## 2020-06-28 DIAGNOSIS — R918 Other nonspecific abnormal finding of lung field: Secondary | ICD-10-CM | POA: Diagnosis not present

## 2020-06-28 DIAGNOSIS — I5032 Chronic diastolic (congestive) heart failure: Secondary | ICD-10-CM | POA: Diagnosis not present

## 2020-07-03 ENCOUNTER — Inpatient Hospital Stay: Payer: Medicare Other | Admitting: Nurse Practitioner

## 2020-07-03 DIAGNOSIS — Z79899 Other long term (current) drug therapy: Secondary | ICD-10-CM | POA: Diagnosis not present

## 2020-07-03 DIAGNOSIS — Z950 Presence of cardiac pacemaker: Secondary | ICD-10-CM | POA: Diagnosis not present

## 2020-07-03 DIAGNOSIS — R0600 Dyspnea, unspecified: Secondary | ICD-10-CM | POA: Diagnosis not present

## 2020-07-03 DIAGNOSIS — R9439 Abnormal result of other cardiovascular function study: Secondary | ICD-10-CM | POA: Diagnosis not present

## 2020-07-03 DIAGNOSIS — I5032 Chronic diastolic (congestive) heart failure: Secondary | ICD-10-CM | POA: Diagnosis not present

## 2020-07-03 DIAGNOSIS — Z6827 Body mass index (BMI) 27.0-27.9, adult: Secondary | ICD-10-CM | POA: Diagnosis not present

## 2020-07-03 DIAGNOSIS — I480 Paroxysmal atrial fibrillation: Secondary | ICD-10-CM | POA: Diagnosis not present

## 2020-07-03 NOTE — Progress Notes (Signed)
Remote pacemaker transmission.   

## 2020-07-04 DIAGNOSIS — I2510 Atherosclerotic heart disease of native coronary artery without angina pectoris: Secondary | ICD-10-CM | POA: Diagnosis not present

## 2020-07-04 DIAGNOSIS — I5032 Chronic diastolic (congestive) heart failure: Secondary | ICD-10-CM | POA: Diagnosis not present

## 2020-07-04 DIAGNOSIS — R0609 Other forms of dyspnea: Secondary | ICD-10-CM | POA: Diagnosis not present

## 2020-07-04 DIAGNOSIS — Z7901 Long term (current) use of anticoagulants: Secondary | ICD-10-CM | POA: Diagnosis not present

## 2020-07-04 DIAGNOSIS — R9439 Abnormal result of other cardiovascular function study: Secondary | ICD-10-CM | POA: Diagnosis not present

## 2020-07-04 DIAGNOSIS — Z950 Presence of cardiac pacemaker: Secondary | ICD-10-CM | POA: Diagnosis not present

## 2020-07-04 DIAGNOSIS — I480 Paroxysmal atrial fibrillation: Secondary | ICD-10-CM | POA: Diagnosis not present

## 2020-08-01 DIAGNOSIS — M0579 Rheumatoid arthritis with rheumatoid factor of multiple sites without organ or systems involvement: Secondary | ICD-10-CM | POA: Diagnosis not present

## 2020-08-02 DIAGNOSIS — I25119 Atherosclerotic heart disease of native coronary artery with unspecified angina pectoris: Secondary | ICD-10-CM | POA: Insufficient documentation

## 2020-08-02 DIAGNOSIS — E785 Hyperlipidemia, unspecified: Secondary | ICD-10-CM | POA: Diagnosis not present

## 2020-08-02 DIAGNOSIS — E1159 Type 2 diabetes mellitus with other circulatory complications: Secondary | ICD-10-CM | POA: Diagnosis not present

## 2020-08-02 DIAGNOSIS — Z6827 Body mass index (BMI) 27.0-27.9, adult: Secondary | ICD-10-CM | POA: Diagnosis not present

## 2020-08-02 DIAGNOSIS — E1169 Type 2 diabetes mellitus with other specified complication: Secondary | ICD-10-CM | POA: Diagnosis not present

## 2020-08-02 DIAGNOSIS — I152 Hypertension secondary to endocrine disorders: Secondary | ICD-10-CM | POA: Diagnosis not present

## 2020-08-02 DIAGNOSIS — I480 Paroxysmal atrial fibrillation: Secondary | ICD-10-CM | POA: Diagnosis not present

## 2020-08-02 DIAGNOSIS — I5032 Chronic diastolic (congestive) heart failure: Secondary | ICD-10-CM | POA: Diagnosis not present

## 2020-08-02 DIAGNOSIS — Z950 Presence of cardiac pacemaker: Secondary | ICD-10-CM | POA: Diagnosis not present

## 2020-08-03 ENCOUNTER — Other Ambulatory Visit: Payer: Self-pay | Admitting: Family Medicine

## 2020-08-03 DIAGNOSIS — I10 Essential (primary) hypertension: Secondary | ICD-10-CM

## 2020-08-03 DIAGNOSIS — E119 Type 2 diabetes mellitus without complications: Secondary | ICD-10-CM

## 2020-08-03 DIAGNOSIS — G4700 Insomnia, unspecified: Secondary | ICD-10-CM

## 2020-08-03 DIAGNOSIS — I480 Paroxysmal atrial fibrillation: Secondary | ICD-10-CM

## 2020-08-03 MED ORDER — MIRTAZAPINE 15 MG PO TABS: 15.0000 mg | ORAL_TABLET | Freq: Every day | ORAL | 0 refills | Status: DC

## 2020-08-03 NOTE — Addendum Note (Signed)
Addended by: Julious Payer D on: 08/03/2020 10:53 AM   Modules accepted: Orders

## 2020-08-07 DIAGNOSIS — K5520 Angiodysplasia of colon without hemorrhage: Secondary | ICD-10-CM | POA: Diagnosis not present

## 2020-08-07 DIAGNOSIS — D126 Benign neoplasm of colon, unspecified: Secondary | ICD-10-CM | POA: Diagnosis not present

## 2020-08-07 DIAGNOSIS — K5790 Diverticulosis of intestine, part unspecified, without perforation or abscess without bleeding: Secondary | ICD-10-CM | POA: Diagnosis not present

## 2020-08-07 DIAGNOSIS — D500 Iron deficiency anemia secondary to blood loss (chronic): Secondary | ICD-10-CM | POA: Diagnosis not present

## 2020-08-15 DIAGNOSIS — M25541 Pain in joints of right hand: Secondary | ICD-10-CM | POA: Diagnosis not present

## 2020-08-15 DIAGNOSIS — G894 Chronic pain syndrome: Secondary | ICD-10-CM | POA: Diagnosis not present

## 2020-08-15 DIAGNOSIS — M25569 Pain in unspecified knee: Secondary | ICD-10-CM | POA: Diagnosis not present

## 2020-08-15 DIAGNOSIS — Z79899 Other long term (current) drug therapy: Secondary | ICD-10-CM | POA: Diagnosis not present

## 2020-08-15 DIAGNOSIS — M545 Low back pain, unspecified: Secondary | ICD-10-CM | POA: Diagnosis not present

## 2020-08-17 ENCOUNTER — Telehealth: Payer: Self-pay

## 2020-08-17 DIAGNOSIS — E119 Type 2 diabetes mellitus without complications: Secondary | ICD-10-CM

## 2020-08-17 MED ORDER — METFORMIN HCL ER 500 MG PO TB24: 500.0000 mg | ORAL_TABLET | Freq: Two times a day (BID) | ORAL | 0 refills | Status: DC

## 2020-08-17 NOTE — Telephone Encounter (Signed)
Per my last note he was on Metformin XR 500 mg BID with meals. Once daily is what was sent in for some reason. I do not see where this could have been changed as patient has not been in for a visit so I sent a new prescription with BID directions. Please inform patient.

## 2020-08-18 ENCOUNTER — Other Ambulatory Visit: Payer: Self-pay | Admitting: Family Medicine

## 2020-08-18 NOTE — Telephone Encounter (Signed)
Pharmacist aware.

## 2020-08-28 DIAGNOSIS — J849 Interstitial pulmonary disease, unspecified: Secondary | ICD-10-CM | POA: Insufficient documentation

## 2020-08-28 DIAGNOSIS — Z6828 Body mass index (BMI) 28.0-28.9, adult: Secondary | ICD-10-CM | POA: Diagnosis not present

## 2020-08-28 DIAGNOSIS — R0602 Shortness of breath: Secondary | ICD-10-CM | POA: Diagnosis not present

## 2020-08-28 DIAGNOSIS — Z01811 Encounter for preprocedural respiratory examination: Secondary | ICD-10-CM | POA: Diagnosis not present

## 2020-08-31 DIAGNOSIS — J849 Interstitial pulmonary disease, unspecified: Secondary | ICD-10-CM | POA: Diagnosis not present

## 2020-08-31 DIAGNOSIS — J479 Bronchiectasis, uncomplicated: Secondary | ICD-10-CM | POA: Diagnosis not present

## 2020-08-31 DIAGNOSIS — R918 Other nonspecific abnormal finding of lung field: Secondary | ICD-10-CM | POA: Diagnosis not present

## 2020-09-01 ENCOUNTER — Other Ambulatory Visit: Payer: Self-pay | Admitting: Nurse Practitioner

## 2020-09-01 DIAGNOSIS — G4700 Insomnia, unspecified: Secondary | ICD-10-CM

## 2020-09-09 DIAGNOSIS — G894 Chronic pain syndrome: Secondary | ICD-10-CM | POA: Diagnosis not present

## 2020-09-09 DIAGNOSIS — Z6833 Body mass index (BMI) 33.0-33.9, adult: Secondary | ICD-10-CM | POA: Diagnosis not present

## 2020-09-09 DIAGNOSIS — M545 Low back pain, unspecified: Secondary | ICD-10-CM | POA: Diagnosis not present

## 2020-09-09 DIAGNOSIS — M25569 Pain in unspecified knee: Secondary | ICD-10-CM | POA: Diagnosis not present

## 2020-09-09 DIAGNOSIS — Z79899 Other long term (current) drug therapy: Secondary | ICD-10-CM | POA: Diagnosis not present

## 2020-09-09 DIAGNOSIS — M25541 Pain in joints of right hand: Secondary | ICD-10-CM | POA: Diagnosis not present

## 2020-09-15 ENCOUNTER — Encounter: Payer: Self-pay | Admitting: Family Medicine

## 2020-09-15 ENCOUNTER — Other Ambulatory Visit: Payer: Self-pay

## 2020-09-15 ENCOUNTER — Ambulatory Visit (INDEPENDENT_AMBULATORY_CARE_PROVIDER_SITE_OTHER): Payer: Medicare Other | Admitting: Family Medicine

## 2020-09-15 VITALS — BP 111/73 | HR 81 | Temp 97.1°F | Ht 66.0 in | Wt 177.8 lb

## 2020-09-15 DIAGNOSIS — I480 Paroxysmal atrial fibrillation: Secondary | ICD-10-CM

## 2020-09-15 DIAGNOSIS — M79604 Pain in right leg: Secondary | ICD-10-CM

## 2020-09-15 DIAGNOSIS — G8929 Other chronic pain: Secondary | ICD-10-CM

## 2020-09-15 DIAGNOSIS — R6889 Other general symptoms and signs: Secondary | ICD-10-CM | POA: Diagnosis not present

## 2020-09-15 DIAGNOSIS — M5442 Lumbago with sciatica, left side: Secondary | ICD-10-CM

## 2020-09-15 DIAGNOSIS — E119 Type 2 diabetes mellitus without complications: Secondary | ICD-10-CM | POA: Diagnosis not present

## 2020-09-15 DIAGNOSIS — M06042 Rheumatoid arthritis without rheumatoid factor, left hand: Secondary | ICD-10-CM

## 2020-09-15 DIAGNOSIS — M06041 Rheumatoid arthritis without rheumatoid factor, right hand: Secondary | ICD-10-CM

## 2020-09-15 DIAGNOSIS — E782 Mixed hyperlipidemia: Secondary | ICD-10-CM

## 2020-09-15 DIAGNOSIS — G4700 Insomnia, unspecified: Secondary | ICD-10-CM | POA: Diagnosis not present

## 2020-09-15 DIAGNOSIS — J849 Interstitial pulmonary disease, unspecified: Secondary | ICD-10-CM

## 2020-09-15 DIAGNOSIS — J449 Chronic obstructive pulmonary disease, unspecified: Secondary | ICD-10-CM | POA: Diagnosis not present

## 2020-09-15 DIAGNOSIS — I25119 Atherosclerotic heart disease of native coronary artery with unspecified angina pectoris: Secondary | ICD-10-CM | POA: Diagnosis not present

## 2020-09-15 DIAGNOSIS — M25512 Pain in left shoulder: Secondary | ICD-10-CM

## 2020-09-15 DIAGNOSIS — I509 Heart failure, unspecified: Secondary | ICD-10-CM

## 2020-09-15 DIAGNOSIS — D500 Iron deficiency anemia secondary to blood loss (chronic): Secondary | ICD-10-CM

## 2020-09-15 DIAGNOSIS — I10 Essential (primary) hypertension: Secondary | ICD-10-CM | POA: Diagnosis not present

## 2020-09-15 LAB — BAYER DCA HB A1C WAIVED: HB A1C (BAYER DCA - WAIVED): 8.4 % — ABNORMAL HIGH (ref <7.0)

## 2020-09-15 MED ORDER — METFORMIN HCL ER 500 MG PO TB24: ORAL_TABLET | ORAL | 1 refills | Status: DC

## 2020-09-15 MED ORDER — MIRTAZAPINE 30 MG PO TABS: 30.0000 mg | ORAL_TABLET | Freq: Every day | ORAL | 1 refills | Status: DC

## 2020-09-15 MED ORDER — HYDROCHLOROTHIAZIDE 12.5 MG PO CAPS: 12.5000 mg | ORAL_CAPSULE | Freq: Every day | ORAL | 1 refills | Status: DC

## 2020-09-15 NOTE — Progress Notes (Signed)
Assessment & Plan:  1. Type 2 diabetes mellitus without complication, unspecified whether long term insulin use (HCC) Lab Results  Component Value Date   HGBA1C 8.4 (H) 09/15/2020   HGBA1C 7.0 (H) 01/19/2020   HGBA1C 6.3 06/29/2019    - Diabetes is not at goal of A1c < 7. - Medications: increase Metformin from 500 mg BID to 1,000 mg in the AM and 500 mg in the PM - Patient is not currently taking a statin as he does not tolerate them. Patient is taking an ACE-inhibitor/ARB.  - Instruction/counseling given: reminded to get eye exam  Diabetes Health Maintenance Due  Topic Date Due  . OPHTHALMOLOGY EXAM  Never done  . FOOT EXAM  10/03/2020  . HEMOGLOBIN A1C  03/15/2021    Lab Results  Component Value Date   MICROALBUR 7.9 12/16/2014   - Lipid panel - CBC with Differential/Platelet - CMP14+EGFR - Bayer DCA Hb A1c Waived - metFORMIN (GLUCOPHAGE-XR) 500 MG 24 hr tablet; Take 2 tablets (1,000 mg total) by mouth daily with breakfast AND 1 tablet (500 mg total) daily with supper.  Dispense: 270 tablet; Refill: 1  2. Insomnia, unspecified type Uncontrolled. Remeron increased from 15 mg to 30 mg at bedtime. - CMP14+EGFR - mirtazapine (REMERON) 30 MG tablet; Take 1 tablet (30 mg total) by mouth at bedtime.  Dispense: 90 tablet; Refill: 1  3. Iron deficiency anemia due to chronic blood loss Labs to assess. - CBC with Differential/Platelet - Anemia Profile B  4. Essential hypertension Well controlled on current regimen. Managed by cardiology. - nebivolol (BYSTOLIC) 5 MG tablet; Take 1 tablet by mouth daily. - Lipid panel - CBC with Differential/Platelet - CMP14+EGFR - potassium chloride (MICRO-K) 10 MEQ CR capsule; Take 10 mEq by mouth daily. - hydrochlorothiazide (MICROZIDE) 12.5 MG capsule; Take 1 capsule (12.5 mg total) by mouth daily.  Dispense: 90 capsule; Refill: 1  5. Paroxysmal atrial fibrillation ([place]) Managed by cardiology. Patient getting the watchman in April. -  CBC with Differential/Platelet - CMP14+EGFR  6. Chronic heart failure, unspecified heart failure type [facility]) Managed by cardiology. - Lipid panel - CBC with Differential/Platelet - CMP14+EGFR  7. Coronary artery disease involving native coronary artery of native heart with angina pectoris ([place]) Unable to tolerate statins. Managed by cardiology. - Lipid panel - CMP14+EGFR  8. Mixed hyperlipidemia Unable to tolerate statins. Managed by cardiology. - Lipid panel - CMP14+EGFR  9. ILD (interstitial lung disease) ([place]) Managed by pulmonology.   10. Chronic obstructive pulmonary disease, unspecified COPD type ([place]) Managed by pulmonology.   11. Chronic midline low back pain with left-sided sciatica [facility] management and possible spinal cord stimulator. He would like to see Dr. Reyne Dumas or Dr. Starleen Blue. [phone] - CMP14+EGFR - Ambulatory referral to Pain Clinic  12. Rheumatoid arthritis involving both hands with negative rheumatoid factor ([place]) Managed by rheumatology. [facility] to [facility] for Intervention pain management and possible spinal cord stimulator. He would like to see Dr. Reyne Dumas or Dr. Starleen Blue. [phone] - CMP14+EGFR - Ambulatory referral to Pain Clinic  13. Pain in right leg Ref[facility] to [facility] for Intervention pain management and possible spinal cord stimulator. He would like to see Dr. Reyne Dumas or Dr. Starleen Blue. [phone] - Ambulatory referral to Pain Clinic  14. Chronic left shoulder pain Referred to [facility] for Intervention pain management and possible spinal cord stimulator. He  would like to see Dr. Reyne Dumas or Dr. Starleen Blue. [phone] - Ambulatory referral to Pain Clinic  15. Hypomagnesemia Labs to assess.  - Magnesium   Return in about 3 months (around 12/13/2020) for DM.  [name], MSN, APRN,  FNP-C [facility]  Subjective:    Patient ID: Adam Watts, male    DOB: 12/23/1950, 70 y.o.   MRN: [id number]  Patient Care Team: Loman Brooklyn, FNP as PCP - General (Family Medicine) Evans Lance, MD as PCP - Cardiology (Cardiology) Josue Hector, MD as Consulting Physician (Cardiology) Evans Lance, MD as Consulting Physician (Cardiology)   Chief Complaint:  Chief Complaint  Patient presents with  . Hypertension    Check up of chronic medical conditions     HPI: Adam Watts is a 70 y.o. male presenting on 09/15/2020 for Hypertension (Check up of chronic medical conditions )  Cardiology manages atrial fibrillation, heart failure, hypertension, and CAD.  Patient reports he is a candidate and is getting the watchman device on April 6th.   Pulmonology manages his COPD and interstitial lung disease.  His rheumatologist manages his rheumatoid arthritis.  He reports he was told they cannot do anything else for him because he is not a candidate for infusions due to his diagnosis of heart failure.  Pain went to [facility] to see if there was anything they could do for him and was advised to obtain a referral to the [facility] in [place] for interventional pain management to discuss the possibility of a spinal cord stimulator.   Diabetes: Patient presents for follow up of diabetes. Known diabetic complications: cardiovascular disease. Medication compliance: yes. Is he  on ACE inhibitor or angiotensin II receptor blocker? Yes. Is he on a statin? No, as he is intolerant.   New complaints: Patient is requesting an increase in Remeron to help with sleep. He reports he is only sleeping 2-3 hours per night. When he wakes up, his pain keeps him from going back to sleep.    Social history:  Relevant past medical, surgical, family and social history reviewed and updated as indicated. Interim medical history since our last visit  reviewed.  Allergies and medications reviewed and updated.  DATA REVIEWED: CHART IN EPIC  ROS: Negative unless specifically indicated above in HPI.    Current Outpatient Medications:  .  albuterol (VENTOLIN HFA) 108 (90 Base) MCG/ACT inhaler, Inhale 2 puffs into the lungs every 6 (six) hours as needed., Disp: 18 g, Rfl: 2 .  CINNAMON PO, Take 2,000 mg by mouth daily. , Disp: , Rfl:  .  clopidogrel (PLAVIX) 75 MG tablet, Take 75 mg by mouth daily., Disp: , Rfl:  .  Ferrous Sulfate (IRON PO), Take by mouth., Disp: , Rfl:  .  fexofenadine (ALLEGRA) 60 MG tablet, Take 60 mg by mouth daily., Disp: , Rfl:  .  furosemide (LASIX) 40 MG tablet, Take 40 mg by mouth daily., Disp: , Rfl:  .  golimumab (SIMPONI ARIA) 50 MG/4ML SOLN injection, Inject into the vein every 8 (eight) weeks., Disp: , Rfl:  .  hydrochlorothiazide (MICROZIDE) 12.5 MG capsule, Take 1 capsule (12.5 mg total) by mouth daily. (Needs to be seen before next refill), Disp: 30 capsule, Rfl: 0 .  HYDROcodone-acetaminophen (NORCO/VICODIN) 5-325 MG tablet, Take 1 tablet by mouth every 4 (four) hours as needed for moderate pain., Disp: , Rfl:  .  ipratropium-albuterol (DUONEB) 0.5-2.5 (3) MG/3ML SOLN, Take 3  mLs by nebulization every 4 (four) hours as needed., Disp: 360 mL, Rfl: 2 .  leflunomide (ARAVA) 20 MG tablet, Take 20 mg by mouth daily., Disp: , Rfl:  .  losartan (COZAAR) 100 MG tablet, TAKE 1 TABLET BY MOUTH EVERY DAY, Disp: 30 tablet, Rfl: 3 .  metFORMIN (GLUCOPHAGE-XR) 500 MG 24 hr tablet, Take 1 tablet (500 mg total) by mouth 2 (two) times daily with a meal. (Needs to be seen before next refill), Disp: 60 tablet, Rfl: 0 .  mirtazapine (REMERON) 15 MG tablet, Take 1 tablet (15 mg total) by mouth at bedtime. (Needs to be seen before next refill), Disp: 30 tablet, Rfl: 0 .  Multiple Vitamins-Minerals (MEGA MULTIVITAMIN FOR MEN PO), Take 1 tablet by mouth daily. , Disp: , Rfl:  .  nebivolol (BYSTOLIC) 5 MG tablet, Take 1 tablet by  mouth daily., Disp: , Rfl:  .  potassium chloride (MICRO-K) 10 MEQ CR capsule, Take 10 mEq by mouth daily., Disp: , Rfl:  .  predniSONE (DELTASONE) 5 MG tablet, Take 5 mg by mouth daily with breakfast., Disp: , Rfl:  .  rivaroxaban (XARELTO) 20 MG TABS tablet, Take 1 tablet (20 mg total) by mouth daily with supper. (Needs to be seen before next refill), Disp: 30 tablet, Rfl: 0 No current facility-administered medications for this visit.  Facility-Administered Medications Ordered in Other Visits:  .  ondansetron (ZOFRAN) 4 mg in sodium chloride 0.9 % 50 mL IVPB, 4 mg, Intravenous, Q6H PRN, Ashok Pall, MD   Allergies  Allergen Reactions  . Other Other (See Comments)   Past Medical History:  Diagnosis Date  . A-fib ([place])   . Chest pain   . Chronic back pain   . Dysrhythmia    a-fib  . Fracture of lateral malleolus 02/11/2015  . Gastro-esophageal reflux disease with esophagitis   . Hyperlipidemia, unspecified   . Hypertension   . Insomnia, unspecified   . Lumbar radiculopathy   . Nicotine dependence, unspecified, uncomplicated   . Pacemaker    CHB, PAF  . Pain in left shoulder   . Pain in right leg   . Pneumonia   . Pre-diabetes   . Presence of cardiac pacemaker   . Rheumatoid arthritis, unspecified ([place])   . Type 2 diabetes mellitus without complications ([place])   . Unilateral inguinal hernia, without obstruction or gangrene, not specified as recurrent     Past Surgical History:  Procedure Laterality Date  . CHOLECYSTECTOMY    . DENTAL SURGERY     implants  . INGUINAL HERNIA REPAIR Bilateral 04/24/2016   Procedure: LAPAROSCOPIC BILATERAL INGUINAL HERNIA REPAIR;  Surgeon: Coralie Keens, MD;  Location: [place];  Service: General;  Laterality: Bilateral;  . INSERTION OF MESH Bilateral 04/24/2016   Procedure: INSERTION OF MESH;  Surgeon: Coralie Keens, MD;  Location: [place];  Service: General;  Laterality: Bilateral;  . LUMBAR  LAMINECTOMY/DECOMPRESSION MICRODISCECTOMY Left 04/10/2018   Procedure: Left Lumbar two-three Laminectomy/Foraminotomy;  Surgeon: Ashok Pall, MD;  Location: [place];  Service: Neurosurgery;  Laterality: Left;  . REVERSE SHOULDER ARTHROPLASTY Left 02/24/2019   Procedure: REVERSE TOTAL SHOULDER ARTHROPLASTY;  Surgeon: Hiram Gash, MD;  Location: [facility];  Service: Orthopedics;  Laterality: Left;  . SHOULDER ARTHROSCOPY Right    x2  . VASECTOMY      Social History   Socioeconomic History  . Marital status: Married    Spouse name: Not on file  . Number of children: Not on  file  . Years of education: Not on file  . Highest education level: Not on file  Occupational History  . Not on file  Tobacco Use  . Smoking status: Former Smoker    Packs/day: 1.00    Years: 25.00    Pack years: 25.00    Types: Cigarettes    Quit date: 02/15/2020    Years since quitting: 0.5  . Smokeless tobacco: Never Used  Vaping Use  . Vaping Use: Never used  Substance and Sexual Activity  . Alcohol use: Yes    Comment: social  . Drug use: No  . Sexual activity: Yes    Birth control/protection: None  Other Topics Concern  . Not on file  Social History Narrative  . Not on file   Social Determinants of Health   Financial Resource Strain: Not on file  Food Insecurity: Not on file  Transportation Needs: Not on file  Physical Activity: Not on file  Stress: Not on file  Social Connections: Not on file  Intimate Partner Violence: Not on file        Objective:    BP 111/73   Pulse 81   Temp (!) 97.1 F (36.2 C) (Temporal)   Ht 5' 6"  (1.676 m)   Wt 177 lb 12.8 oz (80.6 kg)   SpO2 96%   BMI 28.70 kg/m   Wt Readings from Last 3 Encounters:  09/15/20 177 lb 12.8 oz (80.6 kg)  05/31/20 165 lb 9.6 oz (75.1 kg)  04/27/20 176 lb 6.4 oz (80 kg)    Physical Exam Vitals reviewed.  Constitutional:      General: He is not in acute distress.    Appearance: Normal appearance. He is overweight. He is  not ill-appearing, toxic-appearing or diaphoretic.  HENT:     Head: Normocephalic and atraumatic.  Eyes:     General: No scleral icterus.       Right eye: No discharge.        Left eye: No discharge.     Conjunctiva/sclera: Conjunctivae normal.  Cardiovascular:     Rate and Rhythm: Normal rate. Rhythm irregular.     Heart sounds: Normal heart sounds. No murmur heard. No friction rub. No gallop.   Pulmonary:     Effort: Pulmonary effort is normal. No respiratory distress.     Breath sounds: Normal breath sounds. No stridor. No wheezing, rhonchi or rales.  Musculoskeletal:        General: Normal range of motion.     Cervical back: Normal range of motion.  Skin:    General: Skin is warm and dry.  Neurological:     Mental Status: He is alert and oriented to person, place, and time. Mental status is at baseline.  Psychiatric:        Mood and Affect: Mood normal.        Behavior: Behavior normal.        Thought Content: Thought content normal.        Judgment: Judgment normal.     Lab Results  Component Value Date   TSH 3.610 04/26/2020   Lab Results  Component Value Date   WBC 8.4 04/26/2020   HGB 9.3 (L) 04/26/2020   HCT 32.3 (L) 04/26/2020   MCV 77 (L) 04/26/2020   PLT 402 04/26/2020   Lab Results  Component Value Date   NA 141 04/26/2020   K 3.6 04/26/2020   CO2 26 04/26/2020   GLUCOSE 206 (H) 04/26/2020   BUN 13  04/26/2020   CREATININE 1.26 04/26/2020   BILITOT 0.2 04/26/2020   ALKPHOS 114 04/26/2020   AST 13 04/26/2020   ALT 8 04/26/2020   PROT 6.4 04/26/2020   ALBUMIN 3.7 (L) 04/26/2020   CALCIUM 9.2 04/26/2020   ANIONGAP 13 02/15/2019   Lab Results  Component Value Date   CHOL 176 01/19/2020   Lab Results  Component Value Date   HDL 52 01/19/2020   Lab Results  Component Value Date   LDLCALC 95 01/19/2020   Lab Results  Component Value Date   TRIG 169 (H) 01/19/2020   Lab Results  Component Value Date   CHOLHDL 3.4 01/19/2020   Lab  Results  Component Value Date   HGBA1C 7.0 (H) 01/19/2020

## 2020-09-16 LAB — LIPID PANEL
Chol/HDL Ratio: 4 ratio (ref 0.0–5.0)
Cholesterol, Total: 201 mg/dL — ABNORMAL HIGH (ref 100–199)
HDL: 50 mg/dL (ref >39)
LDL Chol Calc (NIH): 98 mg/dL (ref 0–99)
Triglycerides: 317 mg/dL — ABNORMAL HIGH (ref 0–149)
VLDL Cholesterol Cal: 53 mg/dL — ABNORMAL HIGH (ref 5–40)

## 2020-09-16 LAB — CBC WITH DIFFERENTIAL/PLATELET
Basophils Absolute: 0.2 10*3/uL (ref 0.0–0.2)
Basos: 2 %
EOS (ABSOLUTE): 0.3 10*3/uL (ref 0.0–0.4)
Eos: 3 %
Hematocrit: 37 % — ABNORMAL LOW (ref 37.5–51.0)
Hemoglobin: 11.9 g/dL — ABNORMAL LOW (ref 13.0–17.7)
Immature Grans (Abs): 0.1 10*3/uL (ref 0.0–0.1)
Immature Granulocytes: 1 %
Lymphocytes Absolute: 1.2 10*3/uL (ref 0.7–3.1)
Lymphs: 13 %
MCH: 25.2 pg — ABNORMAL LOW (ref 26.6–33.0)
MCHC: 32.2 g/dL (ref 31.5–35.7)
MCV: 78 fL — ABNORMAL LOW (ref 79–97)
Monocytes Absolute: 0.7 10*3/uL (ref 0.1–0.9)
Monocytes: 8 %
Neutrophils Absolute: 7 10*3/uL (ref 1.4–7.0)
Neutrophils: 73 %
Platelets: 313 10*3/uL (ref 150–450)
RBC: 4.73 x10E6/uL (ref 4.14–5.80)
RDW: 16.8 % — ABNORMAL HIGH (ref 11.6–15.4)
WBC: 9.4 10*3/uL (ref 3.4–10.8)

## 2020-09-16 LAB — CMP14+EGFR
ALT: 9 IU/L (ref 0–44)
AST: 8 IU/L (ref 0–40)
Albumin/Globulin Ratio: 1.4 (ref 1.2–2.2)
Albumin: 3.8 g/dL (ref 3.8–4.8)
Alkaline Phosphatase: 125 IU/L — ABNORMAL HIGH (ref 44–121)
BUN/Creatinine Ratio: 9 — ABNORMAL LOW (ref 10–24)
BUN: 9 mg/dL (ref 8–27)
Bilirubin Total: <0.2 mg/dL (ref 0.0–1.2)
CO2: 22 mmol/L (ref 20–29)
Calcium: 9.1 mg/dL (ref 8.6–10.2)
Chloride: 97 mmol/L (ref 96–106)
Creatinine, Ser: 0.97 mg/dL (ref 0.76–1.27)
GFR calc Af Amer: 91 mL/min/{1.73_m2} (ref >59)
GFR calc non Af Amer: 79 mL/min/{1.73_m2} (ref >59)
Globulin, Total: 2.8 g/dL (ref 1.5–4.5)
Glucose: 340 mg/dL — ABNORMAL HIGH (ref 65–99)
Potassium: 3.8 mmol/L (ref 3.5–5.2)
Sodium: 138 mmol/L (ref 134–144)
Total Protein: 6.6 g/dL (ref 6.0–8.5)

## 2020-09-16 LAB — MICROALBUMIN / CREATININE URINE RATIO
Creatinine, Urine: 131.2 mg/dL
Microalb/Creat Ratio: 45 mg/g creat — ABNORMAL HIGH (ref 0–29)
Microalbumin, Urine: 59.4 ug/mL

## 2020-09-17 ENCOUNTER — Encounter: Payer: Self-pay | Admitting: Family Medicine

## 2020-09-17 DIAGNOSIS — E1121 Type 2 diabetes mellitus with diabetic nephropathy: Secondary | ICD-10-CM | POA: Insufficient documentation

## 2020-09-17 HISTORY — DX: Type 2 diabetes mellitus with diabetic nephropathy: E11.21

## 2020-09-19 LAB — ANEMIA PROFILE B
Basophils Absolute: 0.2 10*3/uL (ref 0.0–0.2)
Basos: 2 %
EOS (ABSOLUTE): 0.3 10*3/uL (ref 0.0–0.4)
Eos: 3 %
Ferritin: 36 ng/mL (ref 30–400)
Folate: 8.6 ng/mL (ref >3.0)
Hematocrit: 37 % — ABNORMAL LOW (ref 37.5–51.0)
Hemoglobin: 11.9 g/dL — ABNORMAL LOW (ref 13.0–17.7)
Immature Grans (Abs): 0.1 10*3/uL (ref 0.0–0.1)
Immature Granulocytes: 1 %
Iron Saturation: 12 % — ABNORMAL LOW (ref 15–55)
Iron: 41 ug/dL (ref 38–169)
Lymphocytes Absolute: 1.2 10*3/uL (ref 0.7–3.1)
Lymphs: 13 %
MCH: 25.2 pg — ABNORMAL LOW (ref 26.6–33.0)
MCHC: 32.2 g/dL (ref 31.5–35.7)
MCV: 78 fL — ABNORMAL LOW (ref 79–97)
Monocytes Absolute: 0.7 10*3/uL (ref 0.1–0.9)
Monocytes: 8 %
Neutrophils Absolute: 7 10*3/uL (ref 1.4–7.0)
Neutrophils: 73 %
Platelets: 313 10*3/uL (ref 150–450)
RBC: 4.73 x10E6/uL (ref 4.14–5.80)
RDW: 16.8 % — ABNORMAL HIGH (ref 11.6–15.4)
Total Iron Binding Capacity: 329 ug/dL (ref 250–450)
UIBC: 288 ug/dL (ref 111–343)
Vitamin B-12: 577 pg/mL (ref 232–1245)
WBC: 9.4 10*3/uL (ref 3.4–10.8)

## 2020-09-19 LAB — SPECIMEN STATUS REPORT

## 2020-09-19 LAB — MAGNESIUM: Magnesium: 1.5 mg/dL — ABNORMAL LOW (ref 1.6–2.3)

## 2020-09-20 DIAGNOSIS — J849 Interstitial pulmonary disease, unspecified: Secondary | ICD-10-CM | POA: Diagnosis not present

## 2020-09-20 DIAGNOSIS — D500 Iron deficiency anemia secondary to blood loss (chronic): Secondary | ICD-10-CM | POA: Diagnosis not present

## 2020-09-20 DIAGNOSIS — I2510 Atherosclerotic heart disease of native coronary artery without angina pectoris: Secondary | ICD-10-CM | POA: Diagnosis not present

## 2020-09-20 DIAGNOSIS — I152 Hypertension secondary to endocrine disorders: Secondary | ICD-10-CM | POA: Diagnosis not present

## 2020-09-20 DIAGNOSIS — Z6828 Body mass index (BMI) 28.0-28.9, adult: Secondary | ICD-10-CM | POA: Diagnosis not present

## 2020-09-20 DIAGNOSIS — E785 Hyperlipidemia, unspecified: Secondary | ICD-10-CM | POA: Diagnosis not present

## 2020-09-20 DIAGNOSIS — E1169 Type 2 diabetes mellitus with other specified complication: Secondary | ICD-10-CM | POA: Diagnosis not present

## 2020-09-20 DIAGNOSIS — I5032 Chronic diastolic (congestive) heart failure: Secondary | ICD-10-CM | POA: Diagnosis not present

## 2020-09-20 DIAGNOSIS — I480 Paroxysmal atrial fibrillation: Secondary | ICD-10-CM | POA: Diagnosis not present

## 2020-09-20 DIAGNOSIS — E1159 Type 2 diabetes mellitus with other circulatory complications: Secondary | ICD-10-CM | POA: Diagnosis not present

## 2020-09-22 DIAGNOSIS — J438 Other emphysema: Secondary | ICD-10-CM | POA: Diagnosis not present

## 2020-09-22 DIAGNOSIS — Z6828 Body mass index (BMI) 28.0-28.9, adult: Secondary | ICD-10-CM | POA: Diagnosis not present

## 2020-09-22 DIAGNOSIS — J849 Interstitial pulmonary disease, unspecified: Secondary | ICD-10-CM | POA: Diagnosis not present

## 2020-09-25 ENCOUNTER — Ambulatory Visit (INDEPENDENT_AMBULATORY_CARE_PROVIDER_SITE_OTHER): Payer: Medicare Other

## 2020-09-25 DIAGNOSIS — I509 Heart failure, unspecified: Secondary | ICD-10-CM

## 2020-09-27 LAB — CUP PACEART REMOTE DEVICE CHECK
Battery Impedance: 1033 Ohm
Battery Remaining Longevity: 63 mo
Battery Voltage: 2.78 V
Brady Statistic AP VP Percent: 6 %
Brady Statistic AP VS Percent: 3 %
Brady Statistic AS VP Percent: 33 %
Brady Statistic AS VS Percent: 58 %
Date Time Interrogation Session: 20220228090348
Implantable Lead Implant Date: 20140421
Implantable Lead Implant Date: 20140421
Implantable Lead Location: 753859
Implantable Lead Location: 753860
Implantable Lead Model: 5076
Implantable Lead Model: 5592
Implantable Pulse Generator Implant Date: 20140421
Lead Channel Impedance Value: 478 Ohm
Lead Channel Impedance Value: 486 Ohm
Lead Channel Pacing Threshold Amplitude: 0.375 V
Lead Channel Pacing Threshold Amplitude: 0.5 V
Lead Channel Pacing Threshold Pulse Width: 0.4 ms
Lead Channel Pacing Threshold Pulse Width: 0.4 ms
Lead Channel Setting Pacing Amplitude: 1.5 V
Lead Channel Setting Pacing Amplitude: 2 V
Lead Channel Setting Pacing Pulse Width: 0.4 ms
Lead Channel Setting Sensing Sensitivity: 5.6 mV

## 2020-09-29 DIAGNOSIS — E119 Type 2 diabetes mellitus without complications: Secondary | ICD-10-CM | POA: Diagnosis not present

## 2020-09-29 DIAGNOSIS — H40033 Anatomical narrow angle, bilateral: Secondary | ICD-10-CM | POA: Diagnosis not present

## 2020-09-29 LAB — HM DIABETES EYE EXAM

## 2020-10-03 ENCOUNTER — Telehealth: Payer: Self-pay | Admitting: Family Medicine

## 2020-10-03 NOTE — Progress Notes (Signed)
Remote pacemaker transmission.   

## 2020-10-05 ENCOUNTER — Other Ambulatory Visit: Payer: Self-pay | Admitting: *Deleted

## 2020-10-05 MED ORDER — LOSARTAN POTASSIUM 100 MG PO TABS: 100.0000 mg | ORAL_TABLET | Freq: Every day | ORAL | 0 refills | Status: DC

## 2020-10-07 DIAGNOSIS — M545 Low back pain, unspecified: Secondary | ICD-10-CM | POA: Diagnosis not present

## 2020-10-07 DIAGNOSIS — Z6833 Body mass index (BMI) 33.0-33.9, adult: Secondary | ICD-10-CM | POA: Diagnosis not present

## 2020-10-07 DIAGNOSIS — M25541 Pain in joints of right hand: Secondary | ICD-10-CM | POA: Diagnosis not present

## 2020-10-07 DIAGNOSIS — Z79899 Other long term (current) drug therapy: Secondary | ICD-10-CM | POA: Diagnosis not present

## 2020-10-07 DIAGNOSIS — M25569 Pain in unspecified knee: Secondary | ICD-10-CM | POA: Diagnosis not present

## 2020-10-07 DIAGNOSIS — G894 Chronic pain syndrome: Secondary | ICD-10-CM | POA: Diagnosis not present

## 2020-10-12 ENCOUNTER — Other Ambulatory Visit: Payer: Self-pay

## 2020-10-12 ENCOUNTER — Ambulatory Visit (INDEPENDENT_AMBULATORY_CARE_PROVIDER_SITE_OTHER): Payer: Medicare Other | Admitting: Internal Medicine

## 2020-10-12 ENCOUNTER — Encounter: Payer: Self-pay | Admitting: Internal Medicine

## 2020-10-12 VITALS — BP 118/58 | HR 90 | Wt 177.0 lb

## 2020-10-12 DIAGNOSIS — I442 Atrioventricular block, complete: Secondary | ICD-10-CM

## 2020-10-12 DIAGNOSIS — I25119 Atherosclerotic heart disease of native coronary artery with unspecified angina pectoris: Secondary | ICD-10-CM

## 2020-10-12 LAB — CUP PACEART INCLINIC DEVICE CHECK
Battery Impedance: 1004 Ohm
Battery Remaining Longevity: 65 mo
Battery Voltage: 2.78 V
Brady Statistic AP VP Percent: 6 %
Brady Statistic AP VS Percent: 3 %
Brady Statistic AS VP Percent: 41 %
Brady Statistic AS VS Percent: 51 %
Date Time Interrogation Session: 20220317084659
Implantable Lead Implant Date: 20140421
Implantable Lead Implant Date: 20140421
Implantable Lead Location: 753859
Implantable Lead Location: 753860
Implantable Lead Model: 5076
Implantable Lead Model: 5592
Implantable Pulse Generator Implant Date: 20140421
Lead Channel Impedance Value: 492 Ohm
Lead Channel Impedance Value: 534 Ohm
Lead Channel Pacing Threshold Amplitude: 0.5 V
Lead Channel Pacing Threshold Amplitude: 1 V
Lead Channel Pacing Threshold Pulse Width: 0.4 ms
Lead Channel Pacing Threshold Pulse Width: 0.4 ms
Lead Channel Sensing Intrinsic Amplitude: 15.67 mV
Lead Channel Sensing Intrinsic Amplitude: 4 mV
Lead Channel Setting Pacing Amplitude: 1.5 V
Lead Channel Setting Pacing Amplitude: 2 V
Lead Channel Setting Pacing Pulse Width: 0.4 ms
Lead Channel Setting Sensing Sensitivity: 4 mV

## 2020-10-12 NOTE — Progress Notes (Signed)
HPI Adam Watts returns today for ongoing evaluation and management of his CHB, PAF, s/p PPM insertion. He has HTN. [place]. He denies chest pain. No syncope. He was placed on lasix. He denies palpitations. He would like to start with accupuncture but has to come off of xarelto and is pending a Watchman procedure in a couple of weeks at [employer]. [place]. Allergies  Allergen Reactions  . Other Other (See Comments)  . Statins Other (See Comments)    Pain     Current Outpatient Medications  Medication Sig Dispense Refill  . CINNAMON PO Take 2,000 mg by mouth daily.     . Ferrous Sulfate (IRON PO) Take by mouth.    . fexofenadine (ALLEGRA) 60 MG tablet Take 60 mg by mouth daily.    . furosemide (LASIX) 40 MG tablet Take 40 mg by mouth daily.    . hydrochlorothiazide (MICROZIDE) 12.5 MG capsule Take 1 capsule (12.5 mg total) by mouth daily. 90 capsule 1  . HYDROcodone-acetaminophen (NORCO/VICODIN) 5-325 MG tablet Take 1 tablet by mouth every 4 (four) hours as needed for moderate pain.    Marland Kitchen ipratropium-albuterol (DUONEB) 0.5-2.5 (3) MG/3ML SOLN Take 3 mLs by nebulization every 4 (four) hours as needed. 360 mL 2  . leflunomide (ARAVA) 20 MG tablet Take 20 mg by mouth daily.    Marland Kitchen losartan (COZAAR) 100 MG tablet Take 1 tablet (100 mg total) by mouth daily. 90 tablet 0  . metFORMIN (GLUCOPHAGE-XR) 500 MG 24 hr tablet Take 2 tablets (1,000 mg total) by mouth daily with breakfast AND 1 tablet (500 mg total) daily with supper. 270 tablet 1  . mirtazapine (REMERON) 30 MG tablet Take 1 tablet (30 mg total) by mouth at bedtime. 90 tablet 1  . Multiple Vitamins-Minerals (MEGA MULTIVITAMIN FOR MEN PO) Take 1 tablet by mouth daily.     . nebivolol (BYSTOLIC) 5 MG tablet Take 1 tablet by mouth daily.    . potassium chloride (MICRO-K) 10 MEQ CR capsule Take 10 mEq by mouth daily.    . predniSONE  (DELTASONE) 5 MG tablet Take 5 mg by mouth daily with breakfast.    . rivaroxaban (XARELTO) 20 MG TABS tablet Take 1 tablet (20 mg total) by mouth daily with supper. (Needs to be seen before next refill) 30 tablet 0  . albuterol (VENTOLIN HFA) 108 (90 Base) MCG/ACT inhaler Inhale 2 puffs into the lungs every 6 (six) hours as needed. (Patient not taking: Reported on 10/12/2020) 18 g 2   No current facility-administered medications for this visit.   Facility-Administered Medications Ordered in Other Visits  Medication Dose Route Frequency Provider Last Rate Last Admin  . ondansetron (ZOFRAN) 4 mg in sodium chloride 0.9 % 50 mL IVPB  4 mg Intravenous Q6H PRN Ashok Pall, MD         Past Medical History:  Diagnosis Date  . A-fib ([place])   . Chest pain   . Chronic back pain   . Diabetic nephropathy ([place]) 09/17/2020  . Dysrhythmia    a-fib  . Fracture of lateral malleolus 02/11/2015  . Gastro-esophageal reflux disease with esophagitis   . Hyperlipidemia, unspecified   . Hypertension   . Insomnia, unspecified   . Lumbar radiculopathy   . Nicotine dependence, unspecified, uncomplicated   . Pacemaker    CHB, PAF  . Pain in left  shoulder   . Pain in right leg   . Pneumonia   . Pre-diabetes   . Presence of cardiac pacemaker   . Rheumatoid arthritis, unspecified ([place])   . Type 2 diabetes mellitus without complications ([place])   . Unilateral inguinal hernia, without obstruction or gangrene, not specified as recurrent     ROS:   All systems reviewed and negative except as noted in the HPI.   Past Surgical History:  Procedure Laterality Date  . CHOLECYSTECTOMY    . DENTAL SURGERY     implants  . INGUINAL HERNIA REPAIR Bilateral 04/24/2016   Procedure: LAPAROSCOPIC BILATERAL INGUINAL HERNIA REPAIR;  Surgeon: Coralie Keens, MD;  Location: [place];  Service: General;  Laterality: Bilateral;  . INSERTION OF MESH Bilateral 04/24/2016   Procedure: INSERTION OF MESH;   Surgeon: Coralie Keens, MD;  Location: [place];  Service: General;  Laterality: Bilateral;  . LUMBAR LAMINECTOMY/DECOMPRESSION MICRODISCECTOMY Left 04/10/2018   Procedure: Left Lumbar two-three Laminectomy/Foraminotomy;  Surgeon: Ashok Pall, MD;  Location: [place];  Service: Neurosurgery;  Laterality: Left;  . REVERSE SHOULDER ARTHROPLASTY Left 02/24/2019   Procedure: REVERSE TOTAL SHOULDER ARTHROPLASTY;  Surgeon: Hiram Gash, MD;  [facility];  Service: Orthopedics;  Laterality: Left;  . SHOULDER ARTHROSCOPY Right    x2  . VASECTOMY       Family History  Problem Relation Age of Onset  . Hypertension Mother   . Hypertension Father      Social History   Socioeconomic History  . Marital status: Married    Spouse name: Not on file  . Number of children: Not on file  . Years of education: Not on file  . Highest education level: Not on file  Occupational History  . Not on file  Tobacco Use  . Smoking status: Former Smoker    Packs/day: 1.00    Years: 25.00    Pack years: 25.00    Types: Cigarettes    Quit date: 02/15/2020    Years since quitting: 0.6  . Smokeless tobacco: Never Used  Vaping Use  . Vaping Use: Never used  Substance and Sexual Activity  . Alcohol use: Yes    Comment: social  . Drug use: No  . Sexual activity: Yes    Birth control/protection: None  Other Topics Concern  . Not on file  Social History Narrative  . Not on file   Social Determinants of Health   Financial Resource Strain: Not on file  Food Insecurity: Not on file  Transportation Needs: Not on file  Physical Activity: Not on file  Stress: Not on file  Social Connections: Not on file  Intimate Partner Violence: Not on file     BP (!) 118/58   Pulse 90   Wt 177 lb (80.3 kg)   SpO2 94%   BMI 28.57 kg/m   Physical Exam:  Well appearing NAD HEENT: Unremarkable Neck:  No JVD, no thyromegally Lymphatics:  No adenopathy Back:  No CVA tenderness Lungs:   Clear with no wheezes; scattered rales are present. HEART:  Regular rate rhythm, no murmurs, no rubs, no clicks Abd:  soft, positive bowel sounds, no organomegally, no rebound, no guarding Ext:  2 plus pulses, no edema, no cyanosis, no clubbing Skin:  No rashes no nodules Neuro:  CN II through XII intact, motor grossly intact   DEVICE  Normal device function.  See PaceArt for details.   Assess/Plan: 1. Atrial fib - his VR is  well controlled. He will continue his beta blocker. He is maintaining NSR about 50% of the time and is asymptomatic. He is pending a Forensic scientist. 2. Chronic distolic heart failure - he will continue lasix. I strongly encouraged him to avoid sodium. 3. Recurrent pneumonia/COPD/Tobacco abuse - He has baseline oxygen sats in the low 90's and high 80's. I suspect he is desaturating with acitivity. He has stopped smoking 4. 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 We will recheck in several months.  Carleene Overlie Taylor,MD

## 2020-10-12 NOTE — Patient Instructions (Signed)
Medication Instructions:  Your physician recommends that you continue on your current medications as directed. Please refer to the Current Medication list given to you today.  *If you need a refill on your cardiac medications before your next appointment, please call your pharmacy*   Lab Work: NONE   If you have labs (blood work) drawn today and your tests are completely normal, you will receive your results only by: . MyChart Message (if you have MyChart) OR . A paper copy in the mail If you have any lab test that is abnormal or we need to change your treatment, we will call you to review the results.   Testing/Procedures: NONE    Follow-Up: At [facility], you and your health needs are our priority.  As part of our continuing mission to provide you with exceptional heart care, we have created designated Provider Care Teams.  These Care Teams include your primary Cardiologist (physician) and Advanced Practice Providers (APPs -  Physician Assistants and Nurse Practitioners) who all work together to provide you with the care you need, when you need it.  We recommend signing up for the patient portal called "MyChart".  Sign up information is provided on this After Visit Summary.  MyChart is used to connect with patients for Virtual Visits (Telemedicine).  Patients are able to view lab/test results, encounter notes, upcoming appointments, etc.  Non-urgent messages can be sent to your provider as well.   To learn more about what you can do with MyChart, go to [email or web address].    Your next appointment:   1 year(s)  The format for your next appointment:   In Person  Provider:   Gregg Taylor, MD   Other Instructions Thank you for choosing [facility]!    

## 2020-10-13 DIAGNOSIS — Z7901 Long term (current) use of anticoagulants: Secondary | ICD-10-CM | POA: Insufficient documentation

## 2020-10-13 DIAGNOSIS — Z955 Presence of coronary angioplasty implant and graft: Secondary | ICD-10-CM | POA: Insufficient documentation

## 2020-10-18 DIAGNOSIS — E785 Hyperlipidemia, unspecified: Secondary | ICD-10-CM | POA: Diagnosis not present

## 2020-10-18 DIAGNOSIS — J849 Interstitial pulmonary disease, unspecified: Secondary | ICD-10-CM | POA: Diagnosis not present

## 2020-10-18 DIAGNOSIS — I480 Paroxysmal atrial fibrillation: Secondary | ICD-10-CM | POA: Diagnosis not present

## 2020-10-18 DIAGNOSIS — E1169 Type 2 diabetes mellitus with other specified complication: Secondary | ICD-10-CM | POA: Diagnosis not present

## 2020-10-18 DIAGNOSIS — I152 Hypertension secondary to endocrine disorders: Secondary | ICD-10-CM | POA: Diagnosis not present

## 2020-10-18 DIAGNOSIS — E1159 Type 2 diabetes mellitus with other circulatory complications: Secondary | ICD-10-CM | POA: Diagnosis not present

## 2020-10-18 DIAGNOSIS — Z7901 Long term (current) use of anticoagulants: Secondary | ICD-10-CM | POA: Diagnosis not present

## 2020-10-18 DIAGNOSIS — I5032 Chronic diastolic (congestive) heart failure: Secondary | ICD-10-CM | POA: Diagnosis not present

## 2020-10-18 DIAGNOSIS — I4819 Other persistent atrial fibrillation: Secondary | ICD-10-CM | POA: Diagnosis not present

## 2020-10-18 DIAGNOSIS — J438 Other emphysema: Secondary | ICD-10-CM | POA: Diagnosis not present

## 2020-10-18 DIAGNOSIS — I25118 Atherosclerotic heart disease of native coronary artery with other forms of angina pectoris: Secondary | ICD-10-CM | POA: Diagnosis not present

## 2020-10-18 DIAGNOSIS — Z955 Presence of coronary angioplasty implant and graft: Secondary | ICD-10-CM | POA: Diagnosis not present

## 2020-10-20 DIAGNOSIS — I4819 Other persistent atrial fibrillation: Secondary | ICD-10-CM | POA: Diagnosis not present

## 2020-10-25 DIAGNOSIS — E663 Overweight: Secondary | ICD-10-CM | POA: Diagnosis not present

## 2020-10-25 DIAGNOSIS — M5417 Radiculopathy, lumbosacral region: Secondary | ICD-10-CM | POA: Diagnosis not present

## 2020-10-25 DIAGNOSIS — I501 Left ventricular failure: Secondary | ICD-10-CM | POA: Diagnosis not present

## 2020-10-25 DIAGNOSIS — M0579 Rheumatoid arthritis with rheumatoid factor of multiple sites without organ or systems involvement: Secondary | ICD-10-CM | POA: Diagnosis not present

## 2020-10-25 DIAGNOSIS — M150 Primary generalized (osteo)arthritis: Secondary | ICD-10-CM | POA: Diagnosis not present

## 2020-10-25 DIAGNOSIS — Z6827 Body mass index (BMI) 27.0-27.9, adult: Secondary | ICD-10-CM | POA: Diagnosis not present

## 2020-10-25 DIAGNOSIS — G6289 Other specified polyneuropathies: Secondary | ICD-10-CM | POA: Diagnosis not present

## 2020-10-25 DIAGNOSIS — G894 Chronic pain syndrome: Secondary | ICD-10-CM | POA: Diagnosis not present

## 2020-10-25 DIAGNOSIS — Z79899 Other long term (current) drug therapy: Secondary | ICD-10-CM | POA: Diagnosis not present

## 2020-10-28 DIAGNOSIS — Z79899 Other long term (current) drug therapy: Secondary | ICD-10-CM | POA: Diagnosis not present

## 2020-10-28 DIAGNOSIS — M25569 Pain in unspecified knee: Secondary | ICD-10-CM | POA: Diagnosis not present

## 2020-10-28 DIAGNOSIS — G894 Chronic pain syndrome: Secondary | ICD-10-CM | POA: Diagnosis not present

## 2020-10-28 DIAGNOSIS — Z6833 Body mass index (BMI) 33.0-33.9, adult: Secondary | ICD-10-CM | POA: Diagnosis not present

## 2020-10-28 DIAGNOSIS — M545 Low back pain, unspecified: Secondary | ICD-10-CM | POA: Diagnosis not present

## 2020-10-28 DIAGNOSIS — Z9181 History of falling: Secondary | ICD-10-CM | POA: Diagnosis not present

## 2020-10-28 DIAGNOSIS — M25541 Pain in joints of right hand: Secondary | ICD-10-CM | POA: Diagnosis not present

## 2020-10-31 ENCOUNTER — Other Ambulatory Visit: Payer: Self-pay | Admitting: Family Medicine

## 2020-10-31 DIAGNOSIS — E119 Type 2 diabetes mellitus without complications: Secondary | ICD-10-CM

## 2020-11-01 DIAGNOSIS — Z8701 Personal history of pneumonia (recurrent): Secondary | ICD-10-CM | POA: Diagnosis not present

## 2020-11-01 DIAGNOSIS — I429 Cardiomyopathy, unspecified: Secondary | ICD-10-CM | POA: Insufficient documentation

## 2020-11-01 DIAGNOSIS — I480 Paroxysmal atrial fibrillation: Secondary | ICD-10-CM | POA: Diagnosis not present

## 2020-11-01 DIAGNOSIS — Z950 Presence of cardiac pacemaker: Secondary | ICD-10-CM | POA: Diagnosis not present

## 2020-11-01 DIAGNOSIS — I152 Hypertension secondary to endocrine disorders: Secondary | ICD-10-CM | POA: Diagnosis not present

## 2020-11-01 DIAGNOSIS — M069 Rheumatoid arthritis, unspecified: Secondary | ICD-10-CM | POA: Diagnosis present

## 2020-11-01 DIAGNOSIS — I2510 Atherosclerotic heart disease of native coronary artery without angina pectoris: Secondary | ICD-10-CM | POA: Diagnosis not present

## 2020-11-01 DIAGNOSIS — G8929 Other chronic pain: Secondary | ICD-10-CM | POA: Diagnosis present

## 2020-11-01 DIAGNOSIS — Z20822 Contact with and (suspected) exposure to covid-19: Secondary | ICD-10-CM | POA: Diagnosis present

## 2020-11-01 DIAGNOSIS — I442 Atrioventricular block, complete: Secondary | ICD-10-CM | POA: Diagnosis present

## 2020-11-01 DIAGNOSIS — K2100 Gastro-esophageal reflux disease with esophagitis, without bleeding: Secondary | ICD-10-CM | POA: Diagnosis not present

## 2020-11-01 DIAGNOSIS — E1169 Type 2 diabetes mellitus with other specified complication: Secondary | ICD-10-CM | POA: Diagnosis not present

## 2020-11-01 DIAGNOSIS — K3184 Gastroparesis: Secondary | ICD-10-CM | POA: Diagnosis present

## 2020-11-01 DIAGNOSIS — Z7901 Long term (current) use of anticoagulants: Secondary | ICD-10-CM | POA: Diagnosis not present

## 2020-11-01 DIAGNOSIS — K274 Chronic or unspecified peptic ulcer, site unspecified, with hemorrhage: Secondary | ICD-10-CM | POA: Diagnosis not present

## 2020-11-01 DIAGNOSIS — Z006 Encounter for examination for normal comparison and control in clinical research program: Secondary | ICD-10-CM | POA: Diagnosis not present

## 2020-11-01 DIAGNOSIS — M48061 Spinal stenosis, lumbar region without neurogenic claudication: Secondary | ICD-10-CM | POA: Diagnosis present

## 2020-11-01 DIAGNOSIS — J849 Interstitial pulmonary disease, unspecified: Secondary | ICD-10-CM | POA: Diagnosis not present

## 2020-11-01 DIAGNOSIS — I25118 Atherosclerotic heart disease of native coronary artery with other forms of angina pectoris: Secondary | ICD-10-CM | POA: Diagnosis present

## 2020-11-01 DIAGNOSIS — K5790 Diverticulosis of intestine, part unspecified, without perforation or abscess without bleeding: Secondary | ICD-10-CM | POA: Diagnosis present

## 2020-11-01 DIAGNOSIS — F17210 Nicotine dependence, cigarettes, uncomplicated: Secondary | ICD-10-CM | POA: Diagnosis present

## 2020-11-01 DIAGNOSIS — M5442 Lumbago with sciatica, left side: Secondary | ICD-10-CM | POA: Diagnosis present

## 2020-11-01 DIAGNOSIS — E1143 Type 2 diabetes mellitus with diabetic autonomic (poly)neuropathy: Secondary | ICD-10-CM | POA: Diagnosis present

## 2020-11-01 DIAGNOSIS — E785 Hyperlipidemia, unspecified: Secondary | ICD-10-CM | POA: Diagnosis not present

## 2020-11-01 DIAGNOSIS — J438 Other emphysema: Secondary | ICD-10-CM | POA: Diagnosis present

## 2020-11-01 DIAGNOSIS — I5032 Chronic diastolic (congestive) heart failure: Secondary | ICD-10-CM | POA: Diagnosis not present

## 2020-11-01 DIAGNOSIS — M1990 Unspecified osteoarthritis, unspecified site: Secondary | ICD-10-CM | POA: Diagnosis present

## 2020-11-01 DIAGNOSIS — E1159 Type 2 diabetes mellitus with other circulatory complications: Secondary | ICD-10-CM | POA: Diagnosis not present

## 2020-11-01 DIAGNOSIS — I4819 Other persistent atrial fibrillation: Secondary | ICD-10-CM | POA: Diagnosis not present

## 2020-11-01 DIAGNOSIS — K219 Gastro-esophageal reflux disease without esophagitis: Secondary | ICD-10-CM | POA: Diagnosis present

## 2020-11-06 ENCOUNTER — Other Ambulatory Visit: Payer: Self-pay

## 2020-11-16 DIAGNOSIS — I4819 Other persistent atrial fibrillation: Secondary | ICD-10-CM | POA: Diagnosis not present

## 2020-11-16 DIAGNOSIS — E1169 Type 2 diabetes mellitus with other specified complication: Secondary | ICD-10-CM | POA: Diagnosis not present

## 2020-11-16 DIAGNOSIS — I5032 Chronic diastolic (congestive) heart failure: Secondary | ICD-10-CM | POA: Diagnosis not present

## 2020-11-16 DIAGNOSIS — Z8719 Personal history of other diseases of the digestive system: Secondary | ICD-10-CM | POA: Diagnosis not present

## 2020-11-16 DIAGNOSIS — I25118 Atherosclerotic heart disease of native coronary artery with other forms of angina pectoris: Secondary | ICD-10-CM | POA: Diagnosis not present

## 2020-11-16 DIAGNOSIS — Z6828 Body mass index (BMI) 28.0-28.9, adult: Secondary | ICD-10-CM | POA: Diagnosis not present

## 2020-11-16 DIAGNOSIS — Z95818 Presence of other cardiac implants and grafts: Secondary | ICD-10-CM | POA: Diagnosis not present

## 2020-11-27 ENCOUNTER — Ambulatory Visit (INDEPENDENT_AMBULATORY_CARE_PROVIDER_SITE_OTHER): Payer: Medicare Other | Admitting: Family Medicine

## 2020-11-27 ENCOUNTER — Other Ambulatory Visit: Payer: Self-pay

## 2020-11-27 ENCOUNTER — Encounter: Payer: Self-pay | Admitting: Family Medicine

## 2020-11-27 VITALS — BP 122/68 | HR 80 | Temp 97.0°F | Ht 66.0 in | Wt 179.0 lb

## 2020-11-27 DIAGNOSIS — E119 Type 2 diabetes mellitus without complications: Secondary | ICD-10-CM

## 2020-11-27 DIAGNOSIS — I10 Essential (primary) hypertension: Secondary | ICD-10-CM | POA: Diagnosis not present

## 2020-11-27 DIAGNOSIS — F5101 Primary insomnia: Secondary | ICD-10-CM | POA: Diagnosis not present

## 2020-11-27 DIAGNOSIS — F332 Major depressive disorder, recurrent severe without psychotic features: Secondary | ICD-10-CM

## 2020-11-27 LAB — BAYER DCA HB A1C WAIVED: HB A1C (BAYER DCA - WAIVED): 7.2 % — ABNORMAL HIGH (ref <7.0)

## 2020-11-27 MED ORDER — METFORMIN HCL ER 500 MG PO TB24: 500.0000 mg | ORAL_TABLET | Freq: Three times a day (TID) | ORAL | 1 refills | Status: DC

## 2020-11-27 MED ORDER — TRAZODONE HCL 50 MG PO TABS: 50.0000 mg | ORAL_TABLET | Freq: Every day | ORAL | 2 refills | Status: DC

## 2020-11-27 MED ORDER — LOSARTAN POTASSIUM 100 MG PO TABS: 100.0000 mg | ORAL_TABLET | Freq: Every day | ORAL | 1 refills | Status: DC

## 2020-11-27 NOTE — Progress Notes (Signed)
Assessment & Plan:  1. Type 2 diabetes mellitus without complication, unspecified whether long term insulin use (HCC) Lab Results  Component Value Date   HGBA1C 7.2 (H) 11/27/2020   HGBA1C 8.4 (H) 09/15/2020   HGBA1C 7.0 (H) 01/19/2020    - Diabetes is at goal of A1c 7. - Medications: continue current medications - Home glucose monitoring: continue monitoring - Patient is not currently taking a statin. Patient is taking an ACE-inhibitor/ARB.   Diabetes Health Maintenance Due  Topic Date Due  . HEMOGLOBIN A1C  05/30/2021  . OPHTHALMOLOGY EXAM  09/29/2021  . FOOT EXAM  11/27/2021    Lab Results  Component Value Date   LABMICR 59.4 09/15/2020   MICROALBUR 7.9 12/16/2014   - Bayer DCA Hb A1c Waived - metFORMIN (GLUCOPHAGE-XR) 500 MG 24 hr tablet; Take 1 tablet (500 mg total) by mouth 3 (three) times daily with meals.  Dispense: 270 tablet; Refill: 1  2. Essential hypertension Well controlled on current regimen.  - losartan (COZAAR) 100 MG tablet; Take 1 tablet (100 mg total) by mouth daily.  Dispense: 90 tablet; Refill: 1  3. Primary insomnia Uncontrolled. D/C'd Remeron. Rx'd Trazodone. Advised patient to decrease Remeron to 15 mg x1 week, then stop. - traZODone (DESYREL) 50 MG tablet; Take 1 tablet (50 mg total) by mouth at bedtime.  Dispense: 30 tablet; Refill: 2  4. Severe episode of recurrent major depressive disorder, without psychotic features ([place]) Uncontrolled. Rx'd Trazodone.  - traZODone (DESYREL) 50 MG tablet; Take 1 tablet (50 mg total) by mouth at bedtime.  Dispense: 30 tablet; Refill: 2   Return in about 3 weeks (around 12/18/2020) for sleep (telephone); 3 months chronic follow-up.  [name], MSN, APRN, FNP-C [facility]  Subjective:    Patient ID: Adam Watts, male    DOB: 02-05-1951, 70 y.o.   MRN: [id number]  Patient Care Team: Loman Brooklyn, FNP as PCP - General (Family Medicine) Evans Lance, MD as PCP -  Cardiology (Cardiology) Josue Hector, MD as Consulting Physician (Cardiology) Evans Lance, MD as Consulting Physician (Cardiology) Harlen Labs, MD as Referring Physician (Optometry)   Chief Complaint:  Chief Complaint  Patient presents with  . Diabetes    3 month follow up     HPI: Adam Watts is a 70 y.o. male presenting on 11/27/2020 for Diabetes (3 month follow up )  Patient is accompanied by his wife, who he is okay with being present.   Pain: patient is going to see his rheumatologist next this month and plans to ask for an increase in gabapentin for his neuropathy. He is also scheduled for an appointment with [facility] tomorrow for pain management.   Diabetes: Patient presents for follow up of diabetes. Current symptoms include: hyperglycemia. Known diabetic complications: nephropathy, peripheral neuropathy and cardiovascular disease. Medication compliance: yes, although he has to take the metformin three times/day as his stomach did not tolerate 1,000 mg at once. Current diet: in general, a "healthy" diet  . Current exercise: none. Home blood sugar records: BGs range between 125 and 155. Is he  on ACE inhibitor or angiotensin II receptor blocker? Yes. Is he on a statin? No, as he is intolerant.   Insomnia: patient has been taking Remeron to help with sleep but states it is not working.  Depression screen [facility] 2/9 11/27/2020 01/19/2020 10/04/2019  Decreased Interest 0 0 0  Down, Depressed, Hopeless 3 0 0  PHQ - 2  Score 3 0 0  Altered sleeping 3 - -  Tired, decreased energy 3 - -  Change in appetite 2 - -  Feeling bad or failure about yourself  3 - -  Trouble concentrating 2 - -  Moving slowly or fidgety/restless 1 - -  Suicidal thoughts 1 - -  PHQ-9 Score 18 - -  Difficult doing work/chores Very difficult - -    New complaints: None   Social history:  Relevant past medical, surgical, family and social history reviewed and updated as indicated.  Interim medical history since our last visit reviewed.  Allergies and medications reviewed and updated.  DATA REVIEWED: CHART IN EPIC  ROS: Negative unless specifically indicated above in HPI.    Current Outpatient Medications:  .  albuterol (VENTOLIN HFA) 108 (90 Base) MCG/ACT inhaler, Inhale 2 puffs into the lungs every 6 (six) hours as needed., Disp: 18 g, Rfl: 2 .  CINNAMON PO, Take 2,000 mg by mouth daily. , Disp: , Rfl:  .  diltiazem (TIAZAC) 360 MG 24 hr capsule, Take by mouth., Disp: , Rfl:  .  Ferrous Sulfate (IRON PO), Take by mouth., Disp: , Rfl:  .  fexofenadine (ALLEGRA) 60 MG tablet, Take 60 mg by mouth daily., Disp: , Rfl:  .  folic acid (FOLVITE) 1 MG tablet, Take 2 mg by mouth daily., Disp: , Rfl:  .  furosemide (LASIX) 40 MG tablet, Take 40 mg by mouth daily., Disp: , Rfl:  .  gabapentin (NEURONTIN) 100 MG capsule, Take by mouth., Disp: , Rfl:  .  hydrochlorothiazide (MICROZIDE) 12.5 MG capsule, Take 1 capsule (12.5 mg total) by mouth daily., Disp: 90 capsule, Rfl: 1 .  HYDROcodone-acetaminophen (NORCO/VICODIN) 5-325 MG tablet, Take 1 tablet by mouth every 4 (four) hours as needed for moderate pain., Disp: , Rfl:  .  ipratropium-albuterol (DUONEB) 0.5-2.5 (3) MG/3ML SOLN, Take 3 mLs by nebulization every 4 (four) hours as needed., Disp: 360 mL, Rfl: 2 .  leflunomide (ARAVA) 20 MG tablet, Take 20 mg by mouth daily., Disp: , Rfl:  .  losartan (COZAAR) 100 MG tablet, Take 1 tablet (100 mg total) by mouth daily., Disp: 90 tablet, Rfl: 0 .  Magnesium 250 MG TABS, Take by mouth., Disp: , Rfl:  .  metFORMIN (GLUCOPHAGE-XR) 500 MG 24 hr tablet, Take 2 tablets (1,000 mg total) by mouth daily with breakfast AND 1 tablet (500 mg total) daily with supper., Disp: 270 tablet, Rfl: 1 .  metoprolol succinate (TOPROL-XL) 25 MG 24 hr tablet, Take 1 tablet by mouth daily., Disp: , Rfl:  .  mirtazapine (REMERON) 30 MG tablet, Take 1 tablet (30 mg total) by mouth at bedtime., Disp: 90 tablet,  Rfl: 1 .  Multiple Vitamins-Minerals (MEGA MULTIVITAMIN FOR MEN PO), Take 1 tablet by mouth daily. , Disp: , Rfl:  .  nebivolol (BYSTOLIC) 5 MG tablet, Take 1 tablet by mouth daily., Disp: , Rfl:  .  pantoprazole (PROTONIX) 40 MG tablet, Take 1 tablet by mouth daily., Disp: , Rfl:  .  potassium chloride (MICRO-K) 10 MEQ CR capsule, Take 10 mEq by mouth daily., Disp: , Rfl:  .  predniSONE (DELTASONE) 5 MG tablet, Take 5 mg by mouth daily with breakfast., Disp: , Rfl:  .  rivaroxaban (XARELTO) 20 MG TABS tablet, Take 1 tablet (20 mg total) by mouth daily with supper. (Needs to be seen before next refill), Disp: 30 tablet, Rfl: 0 No current facility-administered medications for this visit.  Facility-Administered Medications Ordered  in Other Visits:  .  ondansetron (ZOFRAN) 4 mg in sodium chloride 0.9 % 50 mL IVPB, 4 mg, Intravenous, Q6H PRN, Ashok Pall, MD   Allergies  Allergen Reactions  . Metoprolol Shortness Of Breath    Patient reports SOB while taking  . Other Other (See Comments)  . Statins Other (See Comments)    Pain   Past Medical History:  Diagnosis Date  . A-fib ([place])   . Chest pain   . Chronic back pain   . Diabetic nephropathy ([place]) 09/17/2020  . Dysrhythmia    a-fib  . Fracture of lateral malleolus 02/11/2015  . Gastro-esophageal reflux disease with esophagitis   . Hyperlipidemia, unspecified   . Hypertension   . Insomnia, unspecified   . Lumbar radiculopathy   . Nicotine dependence, unspecified, uncomplicated   . Pacemaker    CHB, PAF  . Pain in left shoulder   . Pain in right leg   . Pneumonia   . Pre-diabetes   . Presence of cardiac pacemaker   . Rheumatoid arthritis, unspecified ([place])   . Type 2 diabetes mellitus without complications ([place])   . Unilateral inguinal hernia, without obstruction or gangrene, not specified as recurrent     Past Surgical History:  Procedure Laterality Date  . CHOLECYSTECTOMY    . DENTAL SURGERY     implants  . INGUINAL  HERNIA REPAIR Bilateral 04/24/2016   Procedure: LAPAROSCOPIC BILATERAL INGUINAL HERNIA REPAIR;  Surgeon: Coralie Keens, MD;  Location: [place];  Service: General;  Laterality: Bilateral;  . INSERTION OF MESH Bilateral 04/24/2016   Procedure: INSERTION OF MESH;  Surgeon: Coralie Keens, MD;  Location: [place];  Service: General;  Laterality: Bilateral;  . LUMBAR LAMINECTOMY/DECOMPRESSION MICRODISCECTOMY Left 04/10/2018   Procedure: Left Lumbar two-three Laminectomy/Foraminotomy;  Surgeon: Ashok Pall, MD;  Location: [place];  Service: Neurosurgery;  Laterality: Left;  . REVERSE SHOULDER ARTHROPLASTY Left 02/24/2019   Procedure: REVERSE TOTAL SHOULDER ARTHROPLASTY;  Surgeon: Hiram Gash, MD;  Location: [facility];  Service: Orthopedics;  Laterality: Left;  . SHOULDER ARTHROSCOPY Right    x2  . VASECTOMY      Social History   Socioeconomic History  . Marital status: Married    Spouse name: Not on file  . Number of children: Not on file  . Years of education: Not on file  . Highest education level: Not on file  Occupational History  . Not on file  Tobacco Use  . Smoking status: Former Smoker    Packs/day: 1.00    Years: 25.00    Pack years: 25.00    Types: Cigarettes    Quit date: 02/15/2020    Years since quitting: 0.7  . Smokeless tobacco: Never Used  Vaping Use  . Vaping Use: Never used  Substance and Sexual Activity  . Alcohol use: Yes    Comment: social  . Drug use: No  . Sexual activity: Yes    Birth control/protection: None  Other Topics Concern  . Not on file  Social History Narrative  . Not on file   Social Determinants of Health   Financial Resource Strain: Not on file  Food Insecurity: Not on file  Transportation Needs: Not on file  Physical Activity: Not on file  Stress: Not on file  Social Connections: Not on file  Intimate Partner Violence: Not on file        Objective:    BP 122/68   Pulse 80   Temp (!)  97 F  (36.1 C) (Temporal)   Ht 5\' 6"  (1.676 m)   Wt 179 lb (81.2 kg)   SpO2 96%   BMI 28.89 kg/m   Wt Readings from Last 3 Encounters:  11/27/20 179 lb (81.2 kg)  10/12/20 177 lb (80.3 kg)  09/15/20 177 lb 12.8 oz (80.6 kg)    Physical Exam Vitals reviewed.  Constitutional:      General: He is not in acute distress.    Appearance: Normal appearance. He is overweight. He is not ill-appearing, toxic-appearing or diaphoretic.  HENT:     Head: Normocephalic and atraumatic.  Eyes:     General: No scleral icterus.       Right eye: No discharge.        Left eye: No discharge.     Conjunctiva/sclera: Conjunctivae normal.  Cardiovascular:     Rate and Rhythm: Normal rate.  Pulmonary:     Effort: Pulmonary effort is normal. No respiratory distress.  Musculoskeletal:        General: Normal range of motion.     Cervical back: Normal range of motion.  Skin:    General: Skin is warm and dry.  Neurological:     Mental Status: He is alert and oriented to person, place, and time. Mental status is at baseline.  Psychiatric:        Mood and Affect: Mood normal.        Behavior: Behavior normal.        Thought Content: Thought content normal.        Judgment: Judgment normal.     Lab Results  Component Value Date   TSH 3.610 04/26/2020   Lab Results  Component Value Date   WBC 9.4 09/15/2020   WBC 9.4 09/15/2020   HGB 11.9 (L) 09/15/2020   HGB 11.9 (L) 09/15/2020   HCT 37.0 (L) 09/15/2020   HCT 37.0 (L) 09/15/2020   MCV 78 (L) 09/15/2020   MCV 78 (L) 09/15/2020   PLT 313 09/15/2020   PLT 313 09/15/2020   Lab Results  Component Value Date   NA 138 09/15/2020   K 3.8 09/15/2020   CO2 22 09/15/2020   GLUCOSE 340 (H) 09/15/2020   BUN 9 09/15/2020   CREATININE 0.97 09/15/2020   BILITOT <0.2 09/15/2020   ALKPHOS 125 (H) 09/15/2020   AST 8 09/15/2020   ALT 9 09/15/2020   PROT 6.6 09/15/2020   ALBUMIN 3.8 09/15/2020   CALCIUM 9.1 09/15/2020   ANIONGAP 13 02/15/2019   Lab  Results  Component Value Date   CHOL 201 (H) 09/15/2020   Lab Results  Component Value Date   HDL 50 09/15/2020   Lab Results  Component Value Date   LDLCALC 98 09/15/2020   Lab Results  Component Value Date   TRIG 317 (H) 09/15/2020   Lab Results  Component Value Date   CHOLHDL 4.0 09/15/2020   Lab Results  Component Value Date   HGBA1C 8.4 (H) 09/15/2020

## 2020-11-27 NOTE — Patient Instructions (Signed)
Decrease Remeron to 1/2 tablet (15 mg) at bedtime x1 week. May go ahead and start Trazodone. tr

## 2020-11-28 DIAGNOSIS — M25541 Pain in joints of right hand: Secondary | ICD-10-CM | POA: Diagnosis not present

## 2020-11-28 DIAGNOSIS — M25569 Pain in unspecified knee: Secondary | ICD-10-CM | POA: Diagnosis not present

## 2020-11-28 DIAGNOSIS — Z6833 Body mass index (BMI) 33.0-33.9, adult: Secondary | ICD-10-CM | POA: Diagnosis not present

## 2020-11-28 DIAGNOSIS — Z9181 History of falling: Secondary | ICD-10-CM | POA: Diagnosis not present

## 2020-11-28 DIAGNOSIS — Z79899 Other long term (current) drug therapy: Secondary | ICD-10-CM | POA: Diagnosis not present

## 2020-11-28 DIAGNOSIS — M545 Low back pain, unspecified: Secondary | ICD-10-CM | POA: Diagnosis not present

## 2020-11-28 DIAGNOSIS — G894 Chronic pain syndrome: Secondary | ICD-10-CM | POA: Diagnosis not present

## 2020-11-29 DIAGNOSIS — Z6829 Body mass index (BMI) 29.0-29.9, adult: Secondary | ICD-10-CM | POA: Diagnosis not present

## 2020-11-29 DIAGNOSIS — M0579 Rheumatoid arthritis with rheumatoid factor of multiple sites without organ or systems involvement: Secondary | ICD-10-CM | POA: Diagnosis not present

## 2020-11-29 DIAGNOSIS — M5417 Radiculopathy, lumbosacral region: Secondary | ICD-10-CM | POA: Diagnosis not present

## 2020-11-29 DIAGNOSIS — G6289 Other specified polyneuropathies: Secondary | ICD-10-CM | POA: Diagnosis not present

## 2020-11-29 DIAGNOSIS — G894 Chronic pain syndrome: Secondary | ICD-10-CM | POA: Diagnosis not present

## 2020-11-29 DIAGNOSIS — I501 Left ventricular failure: Secondary | ICD-10-CM | POA: Diagnosis not present

## 2020-11-29 DIAGNOSIS — D508 Other iron deficiency anemias: Secondary | ICD-10-CM | POA: Diagnosis not present

## 2020-11-29 DIAGNOSIS — M150 Primary generalized (osteo)arthritis: Secondary | ICD-10-CM | POA: Diagnosis not present

## 2020-11-29 DIAGNOSIS — Z79899 Other long term (current) drug therapy: Secondary | ICD-10-CM | POA: Diagnosis not present

## 2020-11-29 DIAGNOSIS — E663 Overweight: Secondary | ICD-10-CM | POA: Diagnosis not present

## 2020-12-11 DIAGNOSIS — Z950 Presence of cardiac pacemaker: Secondary | ICD-10-CM | POA: Diagnosis not present

## 2020-12-11 DIAGNOSIS — I4891 Unspecified atrial fibrillation: Secondary | ICD-10-CM | POA: Diagnosis not present

## 2020-12-11 DIAGNOSIS — K3184 Gastroparesis: Secondary | ICD-10-CM | POA: Diagnosis not present

## 2020-12-11 DIAGNOSIS — M069 Rheumatoid arthritis, unspecified: Secondary | ICD-10-CM | POA: Diagnosis not present

## 2020-12-11 DIAGNOSIS — I480 Paroxysmal atrial fibrillation: Secondary | ICD-10-CM | POA: Diagnosis not present

## 2020-12-11 DIAGNOSIS — Z95818 Presence of other cardiac implants and grafts: Secondary | ICD-10-CM | POA: Diagnosis not present

## 2020-12-11 DIAGNOSIS — Z7901 Long term (current) use of anticoagulants: Secondary | ICD-10-CM | POA: Diagnosis not present

## 2020-12-11 DIAGNOSIS — Z7902 Long term (current) use of antithrombotics/antiplatelets: Secondary | ICD-10-CM | POA: Diagnosis not present

## 2020-12-11 DIAGNOSIS — J449 Chronic obstructive pulmonary disease, unspecified: Secondary | ICD-10-CM | POA: Diagnosis not present

## 2020-12-11 DIAGNOSIS — I2510 Atherosclerotic heart disease of native coronary artery without angina pectoris: Secondary | ICD-10-CM | POA: Diagnosis not present

## 2020-12-11 DIAGNOSIS — E785 Hyperlipidemia, unspecified: Secondary | ICD-10-CM | POA: Diagnosis not present

## 2020-12-11 DIAGNOSIS — Z955 Presence of coronary angioplasty implant and graft: Secondary | ICD-10-CM | POA: Diagnosis not present

## 2020-12-11 DIAGNOSIS — I10 Essential (primary) hypertension: Secondary | ICD-10-CM | POA: Diagnosis not present

## 2020-12-11 DIAGNOSIS — Z87891 Personal history of nicotine dependence: Secondary | ICD-10-CM | POA: Diagnosis not present

## 2020-12-11 DIAGNOSIS — K219 Gastro-esophageal reflux disease without esophagitis: Secondary | ICD-10-CM | POA: Diagnosis not present

## 2020-12-11 DIAGNOSIS — E1143 Type 2 diabetes mellitus with diabetic autonomic (poly)neuropathy: Secondary | ICD-10-CM | POA: Diagnosis not present

## 2020-12-13 ENCOUNTER — Ambulatory Visit: Payer: Medicare Other | Admitting: Family Medicine

## 2020-12-13 DIAGNOSIS — I5032 Chronic diastolic (congestive) heart failure: Secondary | ICD-10-CM | POA: Diagnosis not present

## 2020-12-13 DIAGNOSIS — E1169 Type 2 diabetes mellitus with other specified complication: Secondary | ICD-10-CM | POA: Diagnosis not present

## 2020-12-13 DIAGNOSIS — Z95818 Presence of other cardiac implants and grafts: Secondary | ICD-10-CM | POA: Diagnosis not present

## 2020-12-13 DIAGNOSIS — D649 Anemia, unspecified: Secondary | ICD-10-CM | POA: Diagnosis not present

## 2020-12-13 DIAGNOSIS — D509 Iron deficiency anemia, unspecified: Secondary | ICD-10-CM | POA: Diagnosis not present

## 2020-12-13 DIAGNOSIS — I4891 Unspecified atrial fibrillation: Secondary | ICD-10-CM | POA: Diagnosis not present

## 2020-12-13 DIAGNOSIS — Z6828 Body mass index (BMI) 28.0-28.9, adult: Secondary | ICD-10-CM | POA: Diagnosis not present

## 2020-12-13 DIAGNOSIS — I25118 Atherosclerotic heart disease of native coronary artery with other forms of angina pectoris: Secondary | ICD-10-CM | POA: Diagnosis not present

## 2020-12-13 DIAGNOSIS — I4819 Other persistent atrial fibrillation: Secondary | ICD-10-CM | POA: Diagnosis not present

## 2020-12-13 DIAGNOSIS — K279 Peptic ulcer, site unspecified, unspecified as acute or chronic, without hemorrhage or perforation: Secondary | ICD-10-CM | POA: Diagnosis not present

## 2020-12-19 ENCOUNTER — Ambulatory Visit (INDEPENDENT_AMBULATORY_CARE_PROVIDER_SITE_OTHER): Payer: Medicare Other | Admitting: Family Medicine

## 2020-12-19 DIAGNOSIS — F332 Major depressive disorder, recurrent severe without psychotic features: Secondary | ICD-10-CM | POA: Diagnosis not present

## 2020-12-19 DIAGNOSIS — F5101 Primary insomnia: Secondary | ICD-10-CM | POA: Diagnosis not present

## 2020-12-19 MED ORDER — TEMAZEPAM 15 MG PO CAPS: 15.0000 mg | ORAL_CAPSULE | Freq: Every day | ORAL | 0 refills | Status: DC

## 2020-12-19 NOTE — Progress Notes (Signed)
Virtual Visit via Telephone Note  I connected with Adam Watts on 12/19/20 at 8:33 AM by telephone and verified that I am speaking with the correct person using two identifiers. Adam Watts is currently located at home and his wife is currently with him during this visit. The provider, Loman Brooklyn, FNP is located in their office at time of visit.  I discussed the limitations, risks, security and privacy concerns of performing an evaluation and management service by telephone and the availability of in person appointments. I also discussed with the patient that there may be a patient responsible charge related to this service. The patient expressed understanding and agreed to proceed.  Subjective: PCP: Loman Brooklyn, FNP  Chief Complaint  Patient presents with  . Insomnia   Patient was started on trazodone to help with depression and insomnia.  He reports it is not helping his insomnia at all.  The only other medication he has tried for insomnia is Remeron, which was also not effective.   ROS: Per HPI  Current Outpatient Medications:  .  aspirin 81 MG EC tablet, Take 1 tablet by mouth daily., Disp: , Rfl:  .  albuterol (VENTOLIN HFA) 108 (90 Base) MCG/ACT inhaler, Inhale 2 puffs into the lungs every 6 (six) hours as needed., Disp: 18 g, Rfl: 2 .  CINNAMON PO, Take 2,000 mg by mouth daily. , Disp: , Rfl:  .  diltiazem (TIAZAC) 360 MG 24 hr capsule, Take by mouth., Disp: , Rfl:  .  Ferrous Sulfate (IRON PO), Take by mouth., Disp: , Rfl:  .  fexofenadine (ALLEGRA) 60 MG tablet, Take 60 mg by mouth daily., Disp: , Rfl:  .  folic acid (FOLVITE) 1 MG tablet, Take 2 mg by mouth daily., Disp: , Rfl:  .  furosemide (LASIX) 40 MG tablet, Take 40 mg by mouth daily., Disp: , Rfl:  .  gabapentin (NEURONTIN) 100 MG capsule, Take by mouth., Disp: , Rfl:  .  hydrochlorothiazide (MICROZIDE) 12.5 MG capsule, Take 1 capsule (12.5 mg total) by mouth daily., Disp: 90 capsule, Rfl: 1 .   HYDROcodone-acetaminophen (NORCO/VICODIN) 5-325 MG tablet, Take 1 tablet by mouth every 4 (four) hours as needed for moderate pain., Disp: , Rfl:  .  ipratropium-albuterol (DUONEB) 0.5-2.5 (3) MG/3ML SOLN, Take 3 mLs by nebulization every 4 (four) hours as needed., Disp: 360 mL, Rfl: 2 .  leflunomide (ARAVA) 20 MG tablet, Take 20 mg by mouth daily., Disp: , Rfl:  .  losartan (COZAAR) 100 MG tablet, Take 1 tablet (100 mg total) by mouth daily., Disp: 90 tablet, Rfl: 1 .  Magnesium 250 MG TABS, Take by mouth., Disp: , Rfl:  .  metFORMIN (GLUCOPHAGE-XR) 500 MG 24 hr tablet, Take 1 tablet (500 mg total) by mouth 3 (three) times daily with meals., Disp: 270 tablet, Rfl: 1 .  methotrexate 50 MG/2ML injection, Inject 20 mg into the skin once a week., Disp: , Rfl:  .  metoprolol succinate (TOPROL-XL) 25 MG 24 hr tablet, Take 1 tablet by mouth daily., Disp: , Rfl:  .  Multiple Vitamins-Minerals (MEGA MULTIVITAMIN FOR MEN PO), Take 1 tablet by mouth daily. , Disp: , Rfl:  .  nebivolol (BYSTOLIC) 5 MG tablet, Take 1 tablet by mouth daily., Disp: , Rfl:  .  pantoprazole (PROTONIX) 40 MG tablet, Take 1 tablet by mouth daily., Disp: , Rfl:  .  potassium chloride (MICRO-K) 10 MEQ CR capsule, Take 10 mEq by mouth daily., Disp: ,  Rfl:  .  predniSONE (DELTASONE) 5 MG tablet, Take 5 mg by mouth daily with breakfast., Disp: , Rfl:  .  rivaroxaban (XARELTO) 20 MG TABS tablet, Take 1 tablet (20 mg total) by mouth daily with supper. (Needs to be seen before next refill), Disp: 30 tablet, Rfl: 0 .  traZODone (DESYREL) 50 MG tablet, Take 1 tablet (50 mg total) by mouth at bedtime., Disp: 30 tablet, Rfl: 2 No current facility-administered medications for this visit.  Facility-Administered Medications Ordered in Other Visits:  .  ondansetron (ZOFRAN) 4 mg in sodium chloride 0.9 % 50 mL IVPB, 4 mg, Intravenous, Q6H PRN, Ashok Pall, MD  Allergies  Allergen Reactions  . Metoprolol Shortness Of Breath    Patient reports  SOB while taking  . Other Other (See Comments)  . Statins Other (See Comments)    Pain   Past Medical History:  Diagnosis Date  . A-fib ([place])   . Chest pain   . Chronic back pain   . Diabetic nephropathy ([place]) 09/17/2020  . Dysrhythmia    a-fib  . Fracture of lateral malleolus 02/11/2015  . Gastro-esophageal reflux disease with esophagitis   . Hyperlipidemia, unspecified   . Hypertension   . Insomnia, unspecified   . Lumbar radiculopathy   . Nicotine dependence, unspecified, uncomplicated   . Pacemaker    CHB, PAF  . Pain in left shoulder   . Pain in right leg   . Pneumonia   . Pre-diabetes   . Presence of cardiac pacemaker   . Rheumatoid arthritis, unspecified ([place])   . Type 2 diabetes mellitus without complications ([place])   . Unilateral inguinal hernia, without obstruction or gangrene, not specified as recurrent     Observations/Objective: A&O  No respiratory distress or wheezing audible over the phone Mood, judgement, and thought processes all WNL   Assessment and Plan: 1. Primary insomnia Uncontrolled.  Failed treatment with trazodone and Remeron.  Started patient on temazepam. - temazepam (RESTORIL) 15 MG capsule; Take 1 capsule (15 mg total) by mouth at bedtime.  Dispense: 30 capsule; Refill: 0  2. Severe episode of recurrent major depressive disorder, without psychotic features ([place]) Continue trazodone to see if that will help with depression.   Follow Up Instructions: Return in about 5 weeks (around 01/23/2021) for Sleep (telephone).  I discussed the assessment and treatment plan with the patient. The patient was provided an opportunity to ask questions and all were answered. The patient agreed with the plan and demonstrated an understanding of the instructions.   The patient was advised to call back or seek an in-person evaluation if the symptoms worsen or if the condition fails to improve as anticipated.  The above assessment and management plan was  discussed with the patient. The patient verbalized understanding of and has agreed to the management plan. Patient is aware to call the clinic if symptoms persist or worsen. Patient is aware when to return to the clinic for a follow-up visit. Patient educated on when it is appropriate to go to the emergency department.   Time call ended: 8:44 AM  I provided 11 minutes of non-face-to-face time during this encounter.  [name], MSN, APRN, FNP-C [facility] 12/19/20

## 2020-12-20 ENCOUNTER — Other Ambulatory Visit: Payer: Self-pay | Admitting: Family Medicine

## 2020-12-20 ENCOUNTER — Ambulatory Visit (INDEPENDENT_AMBULATORY_CARE_PROVIDER_SITE_OTHER): Payer: Medicare Other

## 2020-12-20 VITALS — Ht 66.0 in | Wt 178.0 lb

## 2020-12-20 DIAGNOSIS — Z0000 Encounter for general adult medical examination without abnormal findings: Secondary | ICD-10-CM

## 2020-12-20 MED ORDER — DILTIAZEM HCL ER BEADS 360 MG PO CP24: 360.0000 mg | ORAL_CAPSULE | Freq: Every day | ORAL | 1 refills | Status: DC

## 2020-12-20 NOTE — Patient Instructions (Signed)
Adam Watts , Thank you for taking time to come for your Medicare Wellness Visit. I appreciate your ongoing commitment to your health goals. Please review the following plan we discussed and let me know if I can assist you in the future.   Screening recommendations/referrals: Colonoscopy: Done 04/19/2020 - Repeat in 5 years Recommended yearly ophthalmology/optometry visit for glaucoma screening and checkup Recommended yearly dental visit for hygiene and checkup  Vaccinations: Influenza vaccine: Done 03/29/2020 - Repeat every fall Pneumococcal vaccine: Done 06/14/2016 & 08/06/2017 Tdap vaccine: DUE (every 10 years) Shingles vaccine: DUE Shingrix discussed. Please contact your pharmacy for coverage information.    Covid-19:  Done 08/30/2019 & 10/07/2019  Advanced directives: Please bring a copy of your health care power of attorney and living will to the office to be added to your chart at your convenience.  Conditions/risks identified:  Continue fall prevention, try to increase physical activity to 30 minutes per day as tolderated.  Next appointment: Follow up in one year for your annual wellness visit.   Preventive Care 71 Years and Older, Male  Preventive care refers to lifestyle choices and visits with your health care provider that can promote health and wellness. What does preventive care include?  A yearly physical exam. This is also called an annual well check.  Dental exams once or twice a year.  Routine eye exams. Ask your health care provider how often you should have your eyes checked.  Personal lifestyle choices, including:  Daily care of your teeth and gums.  Regular physical activity.  Eating a healthy diet.  Avoiding tobacco and drug use.  Limiting alcohol use.  Practicing safe sex.  Taking low doses of aspirin every day.  Taking vitamin and mineral supplements as recommended by your health care provider. What happens during an annual well check? The services  and screenings done by your health care provider during your annual well check will depend on your age, overall health, lifestyle risk factors, and family history of disease. Counseling  Your health care provider may ask you questions about your:  Alcohol use.  Tobacco use.  Drug use.  Emotional well-being.  Home and relationship well-being.  Sexual activity.  Eating habits.  History of falls.  Memory and ability to understand (cognition).  Work and work Statistician. Screening  You may have the following tests or measurements:  Height, weight, and BMI.  Blood pressure.  Lipid and cholesterol levels. These may be checked every 5 years, or more frequently if you are over 49 years old.  Skin check.  Lung cancer screening. You may have this screening every year starting at age 37 if you have a 30-pack-year history of smoking and currently smoke or have quit within the past 15 years.  Fecal occult blood test (FOBT) of the stool. You may have this test every year starting at age 13.  Flexible sigmoidoscopy or colonoscopy. You may have a sigmoidoscopy every 5 years or a colonoscopy every 10 years starting at age 1.  Prostate cancer screening. Recommendations will vary depending on your family history and other risks.  Hepatitis C blood test.  Hepatitis B blood test.  Sexually transmitted disease (STD) testing.  Diabetes screening. This is done by checking your blood sugar (glucose) after you have not eaten for a while (fasting). You may have this done every 1-3 years.  Abdominal aortic aneurysm (AAA) screening. You may need this if you are a current or former smoker.  Osteoporosis. You may be screened starting at  age 48 if you are at high risk. Talk with your health care provider about your test results, treatment options, and if necessary, the need for more tests. Vaccines  Your health care provider may recommend certain vaccines, such as:  Influenza vaccine. This  is recommended every year.  Tetanus, diphtheria, and acellular pertussis (Tdap, Td) vaccine. You may need a Td booster every 10 years.  Zoster vaccine. You may need this after age 68.  Pneumococcal 13-valent conjugate (PCV13) vaccine. One dose is recommended after age 14.  Pneumococcal polysaccharide (PPSV23) vaccine. One dose is recommended after age 57. Talk to your health care provider about which screenings and vaccines you need and how often you need them. This information is not intended to replace advice given to you by your health care provider. Make sure you discuss any questions you have with your health care provider. Document Released: 08/11/2015 Document Revised: 04/03/2016 Document Reviewed: 05/16/2015 Elsevier Interactive Patient Education  2017 [place] Prevention in the Home Falls can cause injuries. They can happen to people of all ages. There are many things you can do to make your home safe and to help prevent falls. What can I do on the outside of my home?  Regularly fix the edges of walkways and driveways and fix any cracks.  Remove anything that might make you trip as you walk through a door, such as a raised step or threshold.  Trim any bushes or trees on the path to your home.  Use bright outdoor lighting.  Clear any walking paths of anything that might make someone trip, such as rocks or tools.  Regularly check to see if handrails are loose or broken. Make sure that both sides of any steps have handrails.  Any raised decks and porches should have guardrails on the edges.  Have any leaves, snow, or ice cleared regularly.  Use sand or salt on walking paths during winter.  Clean up any spills in your garage right away. This includes oil or grease spills. What can I do in the bathroom?  Use night lights.  Install grab bars by the toilet and in the tub and shower. Do not use towel bars as grab bars.  Use non-skid mats or decals in the tub or  shower.  If you need to sit down in the shower, use a plastic, non-slip stool.  Keep the floor dry. Clean up any water that spills on the floor as soon as it happens.  Remove soap buildup in the tub or shower regularly.  Attach bath mats securely with double-sided non-slip rug tape.  Do not have throw rugs and other things on the floor that can make you trip. What can I do in the bedroom?  Use night lights.  Make sure that you have a light by your bed that is easy to reach.  Do not use any sheets or blankets that are too big for your bed. They should not hang down onto the floor.  Have a firm chair that has side arms. You can use this for support while you get dressed.  Do not have throw rugs and other things on the floor that can make you trip. What can I do in the kitchen?  Clean up any spills right away.  Avoid walking on wet floors.  Keep items that you use a lot in easy-to-reach places.  If you need to reach something above you, use a strong step stool that has a grab bar.  Keep electrical cords out of the way.  Do not use floor polish or wax that makes floors slippery. If you must use wax, use non-skid floor wax.  Do not have throw rugs and other things on the floor that can make you trip. What can I do with my stairs?  Do not leave any items on the stairs.  Make sure that there are handrails on both sides of the stairs and use them. Fix handrails that are broken or loose. Make sure that handrails are as long as the stairways.  Check any carpeting to make sure that it is firmly attached to the stairs. Fix any carpet that is loose or worn.  Avoid having throw rugs at the top or bottom of the stairs. If you do have throw rugs, attach them to the floor with carpet tape.  Make sure that you have a light switch at the top of the stairs and the bottom of the stairs. If you do not have them, ask someone to add them for you. What else can I do to help prevent  falls?  Wear shoes that:  Do not have high heels.  Have rubber bottoms.  Are comfortable and fit you well.  Are closed at the toe. Do not wear sandals.  If you use a stepladder:  Make sure that it is fully opened. Do not climb a closed stepladder.  Make sure that both sides of the stepladder are locked into place.  Ask someone to hold it for you, if possible.  Clearly mark and make sure that you can see:  Any grab bars or handrails.  First and last steps.  Where the edge of each step is.  Use tools that help you move around (mobility aids) if they are needed. These include:  Canes.  Walkers.  Scooters.  Crutches.  Turn on the lights when you go into a dark area. Replace any light bulbs as soon as they burn out.  Set up your furniture so you have a clear path. Avoid moving your furniture around.  If any of your floors are uneven, fix them.  If there are any pets around you, be aware of where they are.  Review your medicines with your doctor. Some medicines can make you feel dizzy. This can increase your chance of falling. Ask your doctor what other things that you can do to help prevent falls. This information is not intended to replace advice given to you by your health care provider. Make sure you discuss any questions you have with your health care provider. Document Released: 05/11/2009 Document Revised: 12/21/2015 Document Reviewed: 08/19/2014 Elsevier Interactive Patient Education  2017 [employer]  Managing Pain Without Opioids Opioids are strong medicines used to treat moderate to severe pain. For some people, especially those who have long-term (chronic) pain, opioids may not be the best choice for pain management due to:  Side effects like nausea, constipation, and sleepiness.  The risk of addiction (opioid use disorder). The longer you take opioids, the greater your risk of addiction. Pain that lasts for more than 3 months is called chronic pain.  Managing chronic pain usually requires more than one approach and is often provided by a team of health care providers working together (multidisciplinary approach). Pain management may be done at a pain management center or pain clinic. Types of pain management without opioids Managing pain without opioids can involve:  Non-opioid medicines.  Exercises to help relieve pain and improve strength and range of  motion (physical therapy).  Therapy to help with everyday tasks and activities (occupational therapy).  Therapy to help you find ways to relieve pain by doing things you enjoy (recreational therapy).  Talk therapy (psychotherapy) and other mental health therapies.  Medical treatments such as injections or devices.  Making lifestyle changes. Pain management options Non-opioid medicines Non-opioid medicines for pain may include medicines taken by mouth (oral medicines), such as:  Over-the-counter or prescription NSAIDs. These may be the first medicines used for pain. They work well for muscle and bone pain, and they reduce swelling.  Acetaminophen. This over-the-counter medicine may work well for milder pain but not swelling.  Antidepressants. These may be used to treat chronic pain. A certain type of antidepressant (tricyclics) is often used. These medicines are given in lower doses for pain than when used for depression.  Anticonvulsants. These are usually used to treat seizures but may also reduce nerve (neuropathic) pain.  Muscle relaxants. These relieve pain caused by sudden muscle tightening (spasms). You may also use a type of pain medicine that is applied to the skin as a patch, cream, or gel (topical analgesic), such as a numbing medicine. These may cause fewer side effects than oral medicines. Therapy Physical therapy involves doing exercises to gain strength and flexibility. A physical therapist may teach you exercises to move and stretch parts of your body that are weak,  stiff, or painful. You can learn these exercises at physical therapy visits and practice them at home. Physical therapy may also involve:  Massage.  Heat wraps or applying heat or cold to affected areas.  Sending electrical signals through the skin to interrupt pain signals (transcutaneous electrical nerve stimulation, TENS).  Sending weak lasers through the skin to reduce pain and swelling (low-level laser therapy).  Using signals from your body to help you learn to regulate pain (biofeedback). Occupational therapy helps you learn ways to function at home and work with less pain. Recreational therapy may involve trying new activities or hobbies, such as drawing or a physical activity. Types of mental health therapy for pain include:  Cognitive behavioral therapy (CBT) to help you learn coping skills for dealing with pain.  Acceptance and commitment therapy (ACT) to change the way you think and react to pain.  Relaxation therapies, including muscle relaxation exercises and focusing your mind on the present moment to lower stress (mindfulness-based stress reduction).  Pain management counseling. This may be individual, family, or group counseling.   Medical treatments Medical treatments for pain management include:  Nerve block injections. These may include a pain blocker and anti-inflammatory medicines. You may have injections: ? Near the spine to relieve chronic back or neck pain. ? Into joints to relieve back or joint pain. ? Into nerve areas that supply a painful area to relieve body pain. ? Into muscles (trigger point injections) to relieve some painful muscle conditions.  A medical device placed near your spine to help block pain signals and relieve nerve pain or chronic back pain (spinal cord stimulation device).  Acupuncture. Follow these instructions at home Medicines  Take over-the-counter and prescription medicines only as told by your health care provider.  If you  are taking pain medicine, ask your health care providers about possible side effects to watch out for.  Do not drive or use heavy machinery while taking prescription pain medicine. Lifestyle  Do not use drugs or alcohol to reduce pain. Limit alcohol intake to no more than 1 drink a day for nonpregnant women  and 2 drinks a day for men. One drink equals 12 oz of beer, 5 oz of wine, or 1 oz of hard liquor.  Do not use any products that contain nicotine or tobacco, such as cigarettes and e-cigarettes. These can delay healing. If you need help quitting, ask your health care provider.  Eat a healthy diet and maintain a healthy weight. Poor diet and excess weight may make pain worse. ? Eat foods that are high in fiber. These include fresh fruits and vegetables, whole grains, and beans. ? Limit foods that are high in fat and processed sugars, such as fried and sweet foods.  Exercise regularly. Exercise lowers stress and may help relieve pain. ? Ask your health care provider what activities and exercises are safe for you. ? If your health care provider approves, join an exercise class that combines movement and stress reduction. Examples include yoga and tai chi.  Get enough sleep. Lack of sleep may make pain worse.  Lower stress as much as possible. Practice stress reduction techniques as told by your therapist.   General instructions  Work with all your pain management providers to find the treatments that work best for you. You are an important member of your pain management team. There are many things you can do to reduce pain on your own.  Consider joining an online or in-person support group for people who have chronic pain.  Keep all follow-up visits as told by your health care providers. This is important. Where to find more information You can find more information about managing pain without opioids from:  American Academy of Pain Medicine: painmed.[place] for Chronic Pain:  instituteforchronicpain.org  American Chronic Pain Association: theacpa.org Contact a health care provider if:  You have side effects from pain medicine.  Your pain gets worse or does not get better with treatments or home care.  You are struggling with anxiety or depression. Summary  Many types of pain can be managed without opioids. Chronic pain may respond better to pain management without opioids.  Pain is best managed with a team of providers working together.  Pain management without opioids may include non-opioid medicines, medical treatments, physical therapy, mental health therapy, and lifestyle changes.  Tell your health care providers if your pain gets worse or is not being managed well enough. This information is not intended to replace advice given to you by your health care provider. Make sure you discuss any questions you have with your health care provider. Document Revised: 04/27/2020 Document Reviewed: 04/27/2020 Elsevier Patient Education  [place].

## 2020-12-20 NOTE — Telephone Encounter (Signed)
Pt aware refill sent to pharmacy 

## 2020-12-20 NOTE — Progress Notes (Signed)
Subjective:   Adam Watts is a 70 y.o. male who presents for an Initial Medicare Annual Wellness Visit.  Virtual Visit via Telephone Note  I connected with  Adam Watts on 12/20/20 at  1:15 PM EDT by telephone and verified that I am speaking with the correct person using two identifiers.  Location: Patient: Home Provider: [facility] Persons participating in the virtual visit: patient/Nurse Health Advisor   I discussed the limitations, risks, security and privacy concerns of performing an evaluation and management service by telephone and the availability of in person appointments. The patient expressed understanding and agreed to proceed.  Interactive audio and video telecommunications were attempted between this nurse and patient, however failed, due to patient having technical difficulties OR patient did not have access to video capability.  We continued and completed visit with audio only.  Some vital signs may be absent or patient reported.   Bethanne Mule E Miarose Lippert, LPN   Review of Systems     Cardiac Risk Factors include: advanced age (>52men, >58 women);diabetes mellitus;dyslipidemia;hypertension;male gender;sedentary lifestyle;smoking/ tobacco exposure     Objective:    Today's Vitals   12/20/20 1309 12/20/20 1310  Weight: 178 lb (80.7 kg)   Height: 5\' 6"  (1.676 m)   PainSc:  6    Body mass index is 28.73 kg/m.  Advanced Directives 12/20/2020 02/24/2019 02/15/2019 04/03/2018 01/12/2018 04/13/2017 04/24/2016  Does Patient Have a Medical Advance Directive? Yes Yes Yes Yes No Yes Yes  Type of Paramedic of [place];Living will Living will Living will [place];Living will - Living will -  Does patient want to make changes to medical advance directive? - No - Patient declined No - Patient declined No - Patient declined - - -  Copy of [place] in Chart? No - copy requested - - No - copy requested - - No - copy  requested  Would patient like information on creating a medical advance directive? - - - - No - Patient declined - -    Current Medications (verified) Outpatient Encounter Medications as of 12/20/2020  Medication Sig  . albuterol (VENTOLIN HFA) 108 (90 Base) MCG/ACT inhaler Inhale 2 puffs into the lungs every 6 (six) hours as needed.  Marland Kitchen aspirin 81 MG EC tablet Take 1 tablet by mouth daily.  Marland Kitchen CINNAMON PO Take 2,000 mg by mouth daily.   . clopidogrel (PLAVIX) 75 MG tablet Take 75 mg by mouth daily.  Marland Kitchen diltiazem (TIAZAC) 360 MG 24 hr capsule Take 1 capsule (360 mg total) by mouth daily.  . Ferrous Sulfate (IRON PO) Take by mouth.  . fexofenadine (ALLEGRA) 60 MG tablet Take 60 mg by mouth daily.  . folic acid (FOLVITE) 1 MG tablet Take 2 mg by mouth daily.  . furosemide (LASIX) 40 MG tablet Take 40 mg by mouth daily.  Marland Kitchen gabapentin (NEURONTIN) 100 MG capsule Take by mouth.  . hydrochlorothiazide (MICROZIDE) 12.5 MG capsule Take 1 capsule (12.5 mg total) by mouth daily.  Marland Kitchen HYDROcodone-acetaminophen (NORCO/VICODIN) 5-325 MG tablet Take 1 tablet by mouth every 4 (four) hours as needed for moderate pain.  Marland Kitchen ipratropium-albuterol (DUONEB) 0.5-2.5 (3) MG/3ML SOLN Take 3 mLs by nebulization every 4 (four) hours as needed.  . leflunomide (ARAVA) 20 MG tablet Take 20 mg by mouth daily.  Marland Kitchen losartan (COZAAR) 100 MG tablet Take 1 tablet (100 mg total) by mouth daily.  . Magnesium 250 MG TABS Take by mouth.  . metFORMIN (GLUCOPHAGE-XR) 500  MG 24 hr tablet Take 1 tablet (500 mg total) by mouth 3 (three) times daily with meals.  . methotrexate 50 MG/2ML injection Inject 20 mg into the skin once a week.  . metoprolol succinate (TOPROL-XL) 25 MG 24 hr tablet Take 1 tablet by mouth daily.  . Multiple Vitamins-Minerals (MEGA MULTIVITAMIN FOR MEN PO) Take 1 tablet by mouth daily.   . nebivolol (BYSTOLIC) 5 MG tablet Take 1 tablet by mouth daily.  . pantoprazole (PROTONIX) 40 MG tablet Take 1 tablet by mouth  daily.  . potassium chloride (MICRO-K) 10 MEQ CR capsule Take 10 mEq by mouth daily.  . predniSONE (DELTASONE) 5 MG tablet Take 5 mg by mouth daily with breakfast.  . temazepam (RESTORIL) 15 MG capsule Take 1 capsule (15 mg total) by mouth at bedtime.  . traZODone (DESYREL) 50 MG tablet Take 1 tablet (50 mg total) by mouth at bedtime.   Facility-Administered Encounter Medications as of 12/20/2020  Medication  . ondansetron (ZOFRAN) 4 mg in sodium chloride 0.9 % 50 mL IVPB    Allergies (verified) Metoprolol, Other, and Statins   History: Past Medical History:  Diagnosis Date  . A-fib ([employer])   . Chest pain   . Chronic back pain   . Diabetic nephropathy ([place]) 09/17/2020  . Dysrhythmia    a-fib  . Fracture of lateral malleolus 02/11/2015  . Gastro-esophageal reflux disease with esophagitis   . Hyperlipidemia, unspecified   . Hypertension   . Insomnia, unspecified   . Lumbar radiculopathy   . Nicotine dependence, unspecified, uncomplicated   . Pacemaker    CHB, PAF  . Pain in left shoulder   . Pain in right leg   . Pneumonia   . Pre-diabetes   . Presence of cardiac pacemaker   . Rheumatoid arthritis, unspecified ([place])   . Type 2 diabetes mellitus without complications ([place])   . Unilateral inguinal hernia, without obstruction or gangrene, not specified as recurrent    Past Surgical History:  Procedure Laterality Date  . CHOLECYSTECTOMY    . DENTAL SURGERY     implants  . INGUINAL HERNIA REPAIR Bilateral 04/24/2016   Procedure: LAPAROSCOPIC BILATERAL INGUINAL HERNIA REPAIR;  Surgeon: Coralie Keens, MD;  Location: [facility];  Service: General;  Laterality: Bilateral;  . INSERTION OF MESH Bilateral 04/24/2016   Procedure: INSERTION OF MESH;  Surgeon: Coralie Keens, MD;  Location: [facility];  Service: General;  Laterality: Bilateral;  . LEFT ATRIAL APPENDAGE OCCLUSION    . LUMBAR LAMINECTOMY/DECOMPRESSION MICRODISCECTOMY Left 04/10/2018    Procedure: Left Lumbar two-three Laminectomy/Foraminotomy;  Surgeon: Ashok Pall, MD;  Location: [facility];  Service: Neurosurgery;  Laterality: Left;  . REVERSE SHOULDER ARTHROPLASTY Left 02/24/2019   Procedure: REVERSE TOTAL SHOULDER ARTHROPLASTY;  Surgeon: Hiram Gash, MD;  Location: [facility];  Service: Orthopedics;  Laterality: Left;  . SHOULDER ARTHROSCOPY Right    x2  . VASECTOMY     Family History  Problem Relation Age of Onset  . Hypertension Mother   . Hypertension Father    Social History   Socioeconomic History  . Marital status: Married    Spouse name: Not on file  . Number of children: 2  . Years of education: Not on file  . Highest education level: Not on file  Occupational History  . Not on file  Tobacco Use  . Smoking status: Former Smoker    Packs/day: 1.00    Years: 25.00    Pack years: 25.00  Types: Cigarettes    Quit date: 02/15/2020    Years since quitting: 0.8  . Smokeless tobacco: Never Used  Vaping Use  . Vaping Use: Never used  Substance and Sexual Activity  . Alcohol use: Yes    Comment: social  . Drug use: No  . Sexual activity: Yes    Birth control/protection: None  Other Topics Concern  . Not on file  Social History Narrative   Lives home with wife - children live nearby   Social Determinants of Health   Financial Resource Strain: Low Risk   . Difficulty of Paying Living Expenses: Not hard at all  Food Insecurity: No Food Insecurity  . Worried About Charity fundraiser in the Last Year: Never true  . Ran Out of Food in the Last Year: Never true  Transportation Needs: No Transportation Needs  . Lack of Transportation (Medical): No  . Lack of Transportation (Non-Medical): No  Physical Activity: Insufficiently Active  . Days of Exercise per Week: 7 days  . Minutes of Exercise per Session: 10 min  Stress: Stress Concern Present  . Feeling of Stress : Very much  Social Connections: Moderately Integrated  . Frequency of Communication  with Friends and Family: More than three times a week  . Frequency of Social Gatherings with Friends and Family: Once a week  . Attends Religious Services: 1 to 4 times per year  . Active Member of Clubs or Organizations: No  . Attends Archivist Meetings: Never  . Marital Status: Married    Tobacco Counseling Counseling given: Not Answered   Clinical Intake:  Pre-visit preparation completed: Yes  Pain : 0-10 Pain Score: 6  Pain Type: Neuropathic pain Pain Location: Foot Pain Orientation: Right,Left Pain Descriptors / Indicators: Aching,Constant,Burning,Sore Pain Onset: More than a month ago Pain Frequency: Intermittent     BMI - recorded: 28.73 Nutritional Status: BMI 25 -29 Overweight Nutritional Risks: None Diabetes: Yes CBG done?: No Did pt. bring in CBG monitor from home?: No  How often do you need to have someone help you when you read instructions, pamphlets, or other written materials from your doctor or pharmacy?: 1 - Never  Nutrition Risk Assessment:  Has the patient had any N/V/D within the last 2 months?  No  Does the patient have any non-healing wounds?  No  Has the patient had any unintentional weight loss or weight gain?  No   Diabetes:  Is the patient diabetic?  Yes  If diabetic, was a CBG obtained today?  No  Did the patient bring in their glucometer from home?  No  How often do you monitor your CBG's? Once daily fasting - 138 this morning per patient.   Financial Strains and Diabetes Management:  Are you having any financial strains with the device, your supplies or your medication? No .  Does the patient want to be seen by Chronic Care Management for management of their diabetes?  No  Would the patient like to be referred to a Nutritionist or for Diabetic Management?  No   Diabetic Exams:  Diabetic Eye Exam: Completed 09/29/2020.   Diabetic Foot Exam: Completed 11/27/2020. Pt has been advised about the importance in completing this  exam. Pt is scheduled for diabetic foot exam on 02/27/2021.    Interpreter Needed?: No  Information entered by :: Jacson Rapaport, LPN   Activities of Daily Living In your present state of health, do you have any difficulty performing the following activities: 12/20/2020  Hearing? Y  Vision? N  Walking or climbing stairs? Y  Dressing or bathing? N  Doing errands, shopping? N  Preparing Food and eating ? N  Using the Toilet? N  In the past six months, have you accidently leaked urine? Y  Do you have problems with loss of bowel control? Y  Comment sometimes since cholecystectomy  Managing your Medications? N  Managing your Finances? N  Housekeeping or managing your Housekeeping? N  Some recent data might be hidden    Patient Care Team: Loman Brooklyn, FNP as PCP - General (Family Medicine) Evans Lance, MD as PCP - Cardiology (Cardiology) Josue Hector, MD as Consulting Physician (Cardiology) Evans Lance, MD as Consulting Physician (Cardiology) Harlen Labs, MD as Referring Physician (Optometry)  Indicate any recent Medical Services you may have received from other than [facility] providers in the past year (date may be approximate).     Assessment:   This is a routine wellness examination for Krithik.  Hearing/Vision screen  Hearing Screening   125Hz  250Hz  500Hz  1000Hz  2000Hz  3000Hz  4000Hz  6000Hz  8000Hz   Right ear:           Left ear:           Comments: C/o moderate hearing loss - Will have hearing officially checked soon and let us know if he needs assistance with hearing aids (will contact care guide to check)  Vision Screening Comments: Wears eyeglasses - Annual visits with Dr Marin Comment in [place] - up to date with eye exam  Dietary issues and exercise activities discussed: Current Exercise Habits: Home exercise routine, Type of exercise: stretching;walking, Time (Minutes): 15, Frequency (Times/Week): 7, Weekly Exercise (Minutes/Week): 105, Intensity: Mild, Exercise  limited by: orthopedic condition(s);neurologic condition(s);cardiac condition(s)  Goals Addressed            This Visit's Progress   . Patient Stated       Wants to get neuropathy pain under control so he can be more active and feel better       Depression Screen PHQ 2/9 Scores 12/20/2020 11/27/2020 01/19/2020 10/04/2019  PHQ - 2 Score 3 3 0 0  PHQ- 9 Score 16 18 - -    Fall Risk Fall Risk  12/20/2020 11/27/2020 01/19/2020 10/04/2019 02/26/2018  Falls in the past year? 1 0 0 0 No  Comment - - - - Emmi Telephone Survey: data to providers prior to load  Number falls in past yr: 1 - - 0 -  Injury with Fall? 0 - - 0 -  Risk for fall due to : Impaired balance/gait;Orthopedic patient;Medication side effect;History of fall(s);Other (Comment) - - - -  Risk for fall due to: Comment neuropathy - - - -  Follow up Education provided;Falls prevention discussed - - Falls evaluation completed -    FALL RISK PREVENTION PERTAINING TO THE HOME:  Any stairs in or around the home? Yes  If so, are there any without handrails? No  Home free of loose throw rugs in walkways, pet beds, electrical cords, etc? Yes  Adequate lighting in your home to reduce risk of falls? Yes   ASSISTIVE DEVICES UTILIZED TO PREVENT FALLS:  Life alert? No  Use of a cane, walker or w/c? Yes  Grab bars in the bathroom? Yes  Shower chair or bench in shower? No  Elevated toilet seat or a handicapped toilet? No   TIMED UP AND GO:  Was the test performed? No . Telephonic visit.  Cognitive Function:  6CIT Screen 12/20/2020  What Year? 0 points  What month? 0 points  What time? 0 points  Count back from 20 0 points  Months in reverse 0 points  Repeat phrase 0 points  Total Score 0    Immunizations Immunization History  Administered Date(s) Administered  . Fluad Quad(high Dose 65+) 03/29/2020  . Influenza, High Dose Seasonal PF 04/29/2019  . Influenza-Unspecified 05/29/2018  . Moderna Sars-Covid-2 Vaccination  09/09/2019, 10/07/2019  . Pneumococcal Conjugate-13 06/14/2016  . Pneumococcal Polysaccharide-23 08/06/2017    TDAP status: Due, Education has been provided regarding the importance of this vaccine. Advised may receive this vaccine at local pharmacy or Health Dept. Aware to provide a copy of the vaccination record if obtained from local pharmacy or Health Dept. Verbalized acceptance and understanding.  Flu Vaccine status: Up to date  Pneumococcal vaccine status: Up to date  Covid-19 vaccine status: Information provided on how to obtain vaccines.   Qualifies for Shingles Vaccine? Yes   Zostavax completed No   Shingrix Completed?: No.    Education has been provided regarding the importance of this vaccine. Patient has been advised to call insurance company to determine out of pocket expense if they have not yet received this vaccine. Advised may also receive vaccine at local pharmacy or Health Dept. Verbalized acceptance and understanding.  Screening Tests Health Maintenance  Topic Date Due  . COVID-19 Vaccine (3 - Booster for Moderna series) 03/08/2020  . TETANUS/TDAP  09/15/2021 (Originally 11/07/1969)  . Hepatitis C Screening  09/15/2021 (Originally 11/08/1968)  . INFLUENZA VACCINE  02/26/2021  . HEMOGLOBIN A1C  05/30/2021  . OPHTHALMOLOGY EXAM  09/29/2021  . FOOT EXAM  11/27/2021  . COLONOSCOPY (Pts 45-1yrs Insurance coverage will need to be confirmed)  04/19/2025  . PNA vac Low Risk Adult  Completed  . HPV VACCINES  Aged Out    Health Maintenance  Health Maintenance Due  Topic Date Due  . COVID-19 Vaccine (3 - Booster for Moderna series) 03/08/2020    Colorectal cancer screening: Type of screening: Colonoscopy. Completed 04/19/2020. Repeat every 5 years  Lung Cancer Screening: (Low Dose CT Chest recommended if Age 70-80 years, 30 pack-year currently smoking OR have quit w/in 15years.) does qualify.   Lung Cancer Screening Referral: He is having screenings every 6 months -  1 year at this time for Insterstitial lung disease   Additional Screening:  Hepatitis C Screening: does qualify; Do at next visit.  Vision Screening: Recommended annual ophthalmology exams for early detection of glaucoma and other disorders of the eye. Is the patient up to date with their annual eye exam?  Yes  Who is the provider or what is the name of the office in which the patient attends annual eye exams? Dr Marin Comment If pt is not established with a provider, would they like to be referred to a provider to establish care? No .   Dental Screening: Recommended annual dental exams for proper oral hygiene  Community Resource Referral / Chronic Care Management: CRR required this visit?  No   CCM required this visit?  No      Plan:     I have personally reviewed and noted the following in the patient's chart:   . Medical and social history . Use of alcohol, tobacco or illicit drugs  . Current medications and supplements including opioid prescriptions. Patient is currently taking opioid prescriptions. Information provided to patient regarding non-opioid alternatives. Patient advised to discuss non-opioid treatment plan with their provider. . Functional  ability and status . Nutritional status . Physical activity . Advanced directives . List of other physicians . Hospitalizations, surgeries, and ER visits in previous 12 months . Vitals . Screenings to include cognitive, depression, and falls . Referrals and appointments  In addition, I have reviewed and discussed with patient certain preventive protocols, quality metrics, and best practice recommendations. A written personalized care plan for preventive services as well as general preventive health recommendations were provided to patient.     Sandrea Hammond, LPN   1/47/0929   Nurse Notes: None

## 2020-12-20 NOTE — Telephone Encounter (Signed)
  Prescription Request  12/20/2020  What is the name of the medication or equipment? diltiazem  Have you contacted your pharmacy to request a refill? (if applicable) yes  Which pharmacy would you like this sent to? [facility]   Patient notified that their request is being sent to the clinical staff for review and that they should receive a response within 2 business days.

## 2020-12-26 ENCOUNTER — Ambulatory Visit (INDEPENDENT_AMBULATORY_CARE_PROVIDER_SITE_OTHER): Payer: Medicare Other

## 2020-12-26 DIAGNOSIS — I429 Cardiomyopathy, unspecified: Secondary | ICD-10-CM | POA: Diagnosis not present

## 2020-12-26 LAB — CUP PACEART REMOTE DEVICE CHECK
Battery Impedance: 1112 Ohm
Battery Remaining Longevity: 54 mo
Battery Voltage: 2.77 V
Brady Statistic AP VP Percent: 7 %
Brady Statistic AP VS Percent: 0 %
Brady Statistic AS VP Percent: 71 %
Brady Statistic AS VS Percent: 21 %
Date Time Interrogation Session: 20220531084229
Implantable Lead Implant Date: 20140421
Implantable Lead Implant Date: 20140421
Implantable Lead Location: 753859
Implantable Lead Location: 753860
Implantable Lead Model: 5076
Implantable Lead Model: 5592
Implantable Pulse Generator Implant Date: 20140421
Lead Channel Impedance Value: 440 Ohm
Lead Channel Impedance Value: 448 Ohm
Lead Channel Pacing Threshold Amplitude: 0.5 V
Lead Channel Pacing Threshold Amplitude: 1.125 V
Lead Channel Pacing Threshold Pulse Width: 0.4 ms
Lead Channel Pacing Threshold Pulse Width: 0.4 ms
Lead Channel Setting Pacing Amplitude: 2 V
Lead Channel Setting Pacing Amplitude: 2.25 V
Lead Channel Setting Pacing Pulse Width: 0.4 ms
Lead Channel Setting Sensing Sensitivity: 4 mV

## 2020-12-29 DIAGNOSIS — M545 Low back pain, unspecified: Secondary | ICD-10-CM | POA: Diagnosis not present

## 2020-12-29 DIAGNOSIS — Z79899 Other long term (current) drug therapy: Secondary | ICD-10-CM | POA: Diagnosis not present

## 2020-12-29 DIAGNOSIS — G6289 Other specified polyneuropathies: Secondary | ICD-10-CM | POA: Diagnosis not present

## 2020-12-29 DIAGNOSIS — E119 Type 2 diabetes mellitus without complications: Secondary | ICD-10-CM | POA: Diagnosis not present

## 2020-12-29 DIAGNOSIS — M150 Primary generalized (osteo)arthritis: Secondary | ICD-10-CM | POA: Diagnosis not present

## 2020-12-29 DIAGNOSIS — Z9181 History of falling: Secondary | ICD-10-CM | POA: Diagnosis not present

## 2020-12-29 DIAGNOSIS — D508 Other iron deficiency anemias: Secondary | ICD-10-CM | POA: Diagnosis not present

## 2020-12-29 DIAGNOSIS — E663 Overweight: Secondary | ICD-10-CM | POA: Diagnosis not present

## 2020-12-29 DIAGNOSIS — M0579 Rheumatoid arthritis with rheumatoid factor of multiple sites without organ or systems involvement: Secondary | ICD-10-CM | POA: Diagnosis not present

## 2020-12-29 DIAGNOSIS — I501 Left ventricular failure: Secondary | ICD-10-CM | POA: Diagnosis not present

## 2020-12-29 DIAGNOSIS — Z6828 Body mass index (BMI) 28.0-28.9, adult: Secondary | ICD-10-CM | POA: Diagnosis not present

## 2020-12-29 DIAGNOSIS — G894 Chronic pain syndrome: Secondary | ICD-10-CM | POA: Diagnosis not present

## 2020-12-29 DIAGNOSIS — M25569 Pain in unspecified knee: Secondary | ICD-10-CM | POA: Diagnosis not present

## 2020-12-29 DIAGNOSIS — M5417 Radiculopathy, lumbosacral region: Secondary | ICD-10-CM | POA: Diagnosis not present

## 2020-12-29 DIAGNOSIS — Z6833 Body mass index (BMI) 33.0-33.9, adult: Secondary | ICD-10-CM | POA: Diagnosis not present

## 2020-12-29 DIAGNOSIS — M25541 Pain in joints of right hand: Secondary | ICD-10-CM | POA: Diagnosis not present

## 2021-01-05 DIAGNOSIS — K219 Gastro-esophageal reflux disease without esophagitis: Secondary | ICD-10-CM | POA: Diagnosis not present

## 2021-01-05 DIAGNOSIS — J811 Chronic pulmonary edema: Secondary | ICD-10-CM | POA: Diagnosis not present

## 2021-01-05 DIAGNOSIS — Z8674 Personal history of sudden cardiac arrest: Secondary | ICD-10-CM | POA: Diagnosis not present

## 2021-01-05 DIAGNOSIS — E119 Type 2 diabetes mellitus without complications: Secondary | ICD-10-CM | POA: Diagnosis not present

## 2021-01-05 DIAGNOSIS — J156 Pneumonia due to other aerobic Gram-negative bacteria: Secondary | ICD-10-CM | POA: Diagnosis not present

## 2021-01-05 DIAGNOSIS — R1010 Upper abdominal pain, unspecified: Secondary | ICD-10-CM | POA: Diagnosis not present

## 2021-01-05 DIAGNOSIS — E785 Hyperlipidemia, unspecified: Secondary | ICD-10-CM | POA: Diagnosis not present

## 2021-01-05 DIAGNOSIS — Z955 Presence of coronary angioplasty implant and graft: Secondary | ICD-10-CM | POA: Diagnosis not present

## 2021-01-05 DIAGNOSIS — J849 Interstitial pulmonary disease, unspecified: Secondary | ICD-10-CM | POA: Diagnosis not present

## 2021-01-05 DIAGNOSIS — R079 Chest pain, unspecified: Secondary | ICD-10-CM | POA: Diagnosis not present

## 2021-01-05 DIAGNOSIS — I5033 Acute on chronic diastolic (congestive) heart failure: Secondary | ICD-10-CM | POA: Diagnosis not present

## 2021-01-05 DIAGNOSIS — J9601 Acute respiratory failure with hypoxia: Secondary | ICD-10-CM | POA: Diagnosis not present

## 2021-01-05 DIAGNOSIS — Z7984 Long term (current) use of oral hypoglycemic drugs: Secondary | ICD-10-CM | POA: Diagnosis not present

## 2021-01-05 DIAGNOSIS — I4821 Permanent atrial fibrillation: Secondary | ICD-10-CM | POA: Diagnosis not present

## 2021-01-05 DIAGNOSIS — I2510 Atherosclerotic heart disease of native coronary artery without angina pectoris: Secondary | ICD-10-CM | POA: Diagnosis not present

## 2021-01-05 DIAGNOSIS — R072 Precordial pain: Secondary | ICD-10-CM | POA: Diagnosis not present

## 2021-01-06 DIAGNOSIS — R1010 Upper abdominal pain, unspecified: Secondary | ICD-10-CM | POA: Diagnosis not present

## 2021-01-06 DIAGNOSIS — Z7902 Long term (current) use of antithrombotics/antiplatelets: Secondary | ICD-10-CM | POA: Diagnosis not present

## 2021-01-06 DIAGNOSIS — I4821 Permanent atrial fibrillation: Secondary | ICD-10-CM | POA: Diagnosis not present

## 2021-01-06 DIAGNOSIS — R918 Other nonspecific abnormal finding of lung field: Secondary | ICD-10-CM | POA: Diagnosis not present

## 2021-01-06 DIAGNOSIS — R0789 Other chest pain: Secondary | ICD-10-CM | POA: Diagnosis not present

## 2021-01-06 DIAGNOSIS — M069 Rheumatoid arthritis, unspecified: Secondary | ICD-10-CM | POA: Diagnosis present

## 2021-01-06 DIAGNOSIS — I495 Sick sinus syndrome: Secondary | ICD-10-CM | POA: Diagnosis not present

## 2021-01-06 DIAGNOSIS — Z7984 Long term (current) use of oral hypoglycemic drugs: Secondary | ICD-10-CM | POA: Diagnosis not present

## 2021-01-06 DIAGNOSIS — I5033 Acute on chronic diastolic (congestive) heart failure: Secondary | ICD-10-CM | POA: Diagnosis not present

## 2021-01-06 DIAGNOSIS — Z8674 Personal history of sudden cardiac arrest: Secondary | ICD-10-CM | POA: Diagnosis not present

## 2021-01-06 DIAGNOSIS — I2510 Atherosclerotic heart disease of native coronary artery without angina pectoris: Secondary | ICD-10-CM | POA: Diagnosis not present

## 2021-01-06 DIAGNOSIS — Z8701 Personal history of pneumonia (recurrent): Secondary | ICD-10-CM | POA: Diagnosis not present

## 2021-01-06 DIAGNOSIS — E785 Hyperlipidemia, unspecified: Secondary | ICD-10-CM | POA: Diagnosis not present

## 2021-01-06 DIAGNOSIS — Z888 Allergy status to other drugs, medicaments and biological substances status: Secondary | ICD-10-CM | POA: Diagnosis not present

## 2021-01-06 DIAGNOSIS — R7989 Other specified abnormal findings of blood chemistry: Secondary | ICD-10-CM | POA: Diagnosis not present

## 2021-01-06 DIAGNOSIS — R079 Chest pain, unspecified: Secondary | ICD-10-CM | POA: Diagnosis not present

## 2021-01-06 DIAGNOSIS — Z95818 Presence of other cardiac implants and grafts: Secondary | ICD-10-CM | POA: Diagnosis not present

## 2021-01-06 DIAGNOSIS — Z96612 Presence of left artificial shoulder joint: Secondary | ICD-10-CM | POA: Diagnosis present

## 2021-01-06 DIAGNOSIS — Z950 Presence of cardiac pacemaker: Secondary | ICD-10-CM | POA: Diagnosis not present

## 2021-01-06 DIAGNOSIS — Z955 Presence of coronary angioplasty implant and graft: Secondary | ICD-10-CM | POA: Diagnosis not present

## 2021-01-06 DIAGNOSIS — E119 Type 2 diabetes mellitus without complications: Secondary | ICD-10-CM | POA: Diagnosis present

## 2021-01-06 DIAGNOSIS — J849 Interstitial pulmonary disease, unspecified: Secondary | ICD-10-CM | POA: Diagnosis present

## 2021-01-06 DIAGNOSIS — J189 Pneumonia, unspecified organism: Secondary | ICD-10-CM | POA: Diagnosis not present

## 2021-01-06 DIAGNOSIS — J9601 Acute respiratory failure with hypoxia: Secondary | ICD-10-CM | POA: Diagnosis not present

## 2021-01-06 DIAGNOSIS — I10 Essential (primary) hypertension: Secondary | ICD-10-CM | POA: Diagnosis not present

## 2021-01-06 DIAGNOSIS — K219 Gastro-esophageal reflux disease without esophagitis: Secondary | ICD-10-CM | POA: Diagnosis present

## 2021-01-06 DIAGNOSIS — J156 Pneumonia due to other aerobic Gram-negative bacteria: Secondary | ICD-10-CM | POA: Diagnosis present

## 2021-01-06 DIAGNOSIS — Z87891 Personal history of nicotine dependence: Secondary | ICD-10-CM | POA: Diagnosis not present

## 2021-01-06 DIAGNOSIS — Z95810 Presence of automatic (implantable) cardiac defibrillator: Secondary | ICD-10-CM | POA: Diagnosis not present

## 2021-01-06 DIAGNOSIS — Z79899 Other long term (current) drug therapy: Secondary | ICD-10-CM | POA: Diagnosis not present

## 2021-01-11 ENCOUNTER — Telehealth: Payer: Self-pay

## 2021-01-11 NOTE — Telephone Encounter (Signed)
Transition Care Management Follow-up Telephone Call Date of discharge and from where: [facility] in [place], [place] 01/10/21 Diagnosis: Acute Respiratory Failure; Acute on Chronic CHF How have you been since you were released from the hospital? He is having a terrible day. His chest is hurting from coughing so much and he feels so anxious - almost like he is having mini panic attacks all day (He is taking some new medications - please advise if you think he should d/c anything?) Any questions or concerns? Yes as noted above  Items Reviewed: Did the pt receive and understand the discharge instructions provided? Yes  Medications obtained and verified? Yes  Other? No  Any new allergies since your discharge? No  Dietary orders reviewed? Yes Do you have support at home? Yes   Home Care and Equipment/Supplies: Were home health services ordered? no Were any new equipment or medical supplies ordered?  No  Functional Questionnaire: (I = Independent and D = Dependent) ADLs: I  Bathing/Dressing- I  Meal Prep- I  Eating- I  Maintaining continence- I  Transferring/Ambulation- I  Managing Meds- I  Follow up appointments reviewed:  PCP Hospital f/u appt confirmed? Yes  Scheduled to see [name] on 01/16/21 @ 3:50. [facility] f/u appt confirmed? No   Are transportation arrangements needed? No  If their condition worsens, is the pt aware to call PCP or go to the Emergency Dept.? Yes Was the patient provided with contact information for the PCP's office or ED? Yes Was to pt encouraged to call back with questions or concerns? Yes

## 2021-01-14 DIAGNOSIS — R0602 Shortness of breath: Secondary | ICD-10-CM | POA: Diagnosis not present

## 2021-01-14 DIAGNOSIS — J811 Chronic pulmonary edema: Secondary | ICD-10-CM | POA: Diagnosis not present

## 2021-01-14 DIAGNOSIS — N179 Acute kidney failure, unspecified: Secondary | ICD-10-CM | POA: Diagnosis present

## 2021-01-14 DIAGNOSIS — I25118 Atherosclerotic heart disease of native coronary artery with other forms of angina pectoris: Secondary | ICD-10-CM | POA: Diagnosis present

## 2021-01-14 DIAGNOSIS — R0902 Hypoxemia: Secondary | ICD-10-CM | POA: Diagnosis not present

## 2021-01-14 DIAGNOSIS — J8410 Pulmonary fibrosis, unspecified: Secondary | ICD-10-CM | POA: Diagnosis present

## 2021-01-14 DIAGNOSIS — J90 Pleural effusion, not elsewhere classified: Secondary | ICD-10-CM | POA: Diagnosis not present

## 2021-01-14 DIAGNOSIS — J449 Chronic obstructive pulmonary disease, unspecified: Secondary | ICD-10-CM | POA: Diagnosis present

## 2021-01-14 DIAGNOSIS — A4189 Other specified sepsis: Secondary | ICD-10-CM | POA: Diagnosis present

## 2021-01-14 DIAGNOSIS — R339 Retention of urine, unspecified: Secondary | ICD-10-CM | POA: Diagnosis present

## 2021-01-14 DIAGNOSIS — D509 Iron deficiency anemia, unspecified: Secondary | ICD-10-CM | POA: Diagnosis present

## 2021-01-14 DIAGNOSIS — J189 Pneumonia, unspecified organism: Secondary | ICD-10-CM | POA: Diagnosis not present

## 2021-01-14 DIAGNOSIS — Z9981 Dependence on supplemental oxygen: Secondary | ICD-10-CM | POA: Diagnosis not present

## 2021-01-14 DIAGNOSIS — I472 Ventricular tachycardia: Secondary | ICD-10-CM | POA: Diagnosis not present

## 2021-01-14 DIAGNOSIS — J849 Interstitial pulmonary disease, unspecified: Secondary | ICD-10-CM | POA: Diagnosis present

## 2021-01-14 DIAGNOSIS — I4821 Permanent atrial fibrillation: Secondary | ICD-10-CM | POA: Diagnosis present

## 2021-01-14 DIAGNOSIS — J158 Pneumonia due to other specified bacteria: Secondary | ICD-10-CM | POA: Diagnosis present

## 2021-01-14 DIAGNOSIS — Z20822 Contact with and (suspected) exposure to covid-19: Secondary | ICD-10-CM | POA: Diagnosis not present

## 2021-01-14 DIAGNOSIS — I509 Heart failure, unspecified: Secondary | ICD-10-CM | POA: Diagnosis not present

## 2021-01-14 DIAGNOSIS — A419 Sepsis, unspecified organism: Secondary | ICD-10-CM | POA: Diagnosis not present

## 2021-01-14 DIAGNOSIS — I480 Paroxysmal atrial fibrillation: Secondary | ICD-10-CM | POA: Diagnosis not present

## 2021-01-14 DIAGNOSIS — N4889 Other specified disorders of penis: Secondary | ICD-10-CM | POA: Diagnosis not present

## 2021-01-14 DIAGNOSIS — Z950 Presence of cardiac pacemaker: Secondary | ICD-10-CM | POA: Diagnosis not present

## 2021-01-14 DIAGNOSIS — R079 Chest pain, unspecified: Secondary | ICD-10-CM | POA: Diagnosis not present

## 2021-01-14 DIAGNOSIS — R7989 Other specified abnormal findings of blood chemistry: Secondary | ICD-10-CM | POA: Diagnosis present

## 2021-01-14 DIAGNOSIS — I442 Atrioventricular block, complete: Secondary | ICD-10-CM | POA: Diagnosis present

## 2021-01-14 DIAGNOSIS — H9190 Unspecified hearing loss, unspecified ear: Secondary | ICD-10-CM | POA: Diagnosis present

## 2021-01-14 DIAGNOSIS — R0789 Other chest pain: Secondary | ICD-10-CM | POA: Diagnosis not present

## 2021-01-14 DIAGNOSIS — Z95818 Presence of other cardiac implants and grafts: Secondary | ICD-10-CM | POA: Diagnosis not present

## 2021-01-14 DIAGNOSIS — K279 Peptic ulcer, site unspecified, unspecified as acute or chronic, without hemorrhage or perforation: Secondary | ICD-10-CM | POA: Diagnosis present

## 2021-01-14 DIAGNOSIS — I110 Hypertensive heart disease with heart failure: Secondary | ICD-10-CM | POA: Diagnosis not present

## 2021-01-14 DIAGNOSIS — M069 Rheumatoid arthritis, unspecified: Secondary | ICD-10-CM | POA: Diagnosis present

## 2021-01-14 DIAGNOSIS — I5033 Acute on chronic diastolic (congestive) heart failure: Secondary | ICD-10-CM | POA: Diagnosis present

## 2021-01-14 DIAGNOSIS — E1165 Type 2 diabetes mellitus with hyperglycemia: Secondary | ICD-10-CM | POA: Diagnosis present

## 2021-01-14 DIAGNOSIS — I495 Sick sinus syndrome: Secondary | ICD-10-CM | POA: Diagnosis present

## 2021-01-14 DIAGNOSIS — K219 Gastro-esophageal reflux disease without esophagitis: Secondary | ICD-10-CM | POA: Diagnosis present

## 2021-01-14 DIAGNOSIS — I4819 Other persistent atrial fibrillation: Secondary | ICD-10-CM | POA: Diagnosis not present

## 2021-01-14 DIAGNOSIS — R410 Disorientation, unspecified: Secondary | ICD-10-CM | POA: Diagnosis present

## 2021-01-14 DIAGNOSIS — E785 Hyperlipidemia, unspecified: Secondary | ICD-10-CM | POA: Diagnosis present

## 2021-01-14 DIAGNOSIS — I10 Essential (primary) hypertension: Secondary | ICD-10-CM | POA: Diagnosis not present

## 2021-01-14 DIAGNOSIS — J9601 Acute respiratory failure with hypoxia: Secondary | ICD-10-CM | POA: Diagnosis not present

## 2021-01-14 DIAGNOSIS — I2510 Atherosclerotic heart disease of native coronary artery without angina pectoris: Secondary | ICD-10-CM | POA: Diagnosis not present

## 2021-01-15 DIAGNOSIS — R339 Retention of urine, unspecified: Secondary | ICD-10-CM | POA: Insufficient documentation

## 2021-01-15 DIAGNOSIS — Z95818 Presence of other cardiac implants and grafts: Secondary | ICD-10-CM | POA: Insufficient documentation

## 2021-01-16 ENCOUNTER — Inpatient Hospital Stay: Payer: Medicare Other | Admitting: Family Medicine

## 2021-01-18 NOTE — Progress Notes (Signed)
Remote pacemaker transmission.   

## 2021-01-22 ENCOUNTER — Telehealth: Payer: Self-pay

## 2021-01-22 NOTE — Telephone Encounter (Signed)
Contact Date: 01/22/2021 Contacted By: Frutoso Schatz, LPN  Transition Care Management Follow-up Telephone Call Date of discharge and from where: 01/22/2021 Discharge Diagnosis: [place] How have you been since you were released from the hospital? Just got home today, feeling okay and resting Any questions or concerns? No   Items Reviewed: Did the pt receive and understand the discharge instructions provided? Yes  Medications obtained and verified? Yes  Any new allergies since your discharge? No  Dietary orders reviewed? Yes Do you have support at home? Yes   Discontinued Medications None New Medications Added Probiotic, Augmentin, Flomax, and Magnesium  Current Medication List Allergies as of 01/22/2021       Reactions   Metoprolol Shortness Of Breath   Patient reports SOB while taking   Abatacept Other (See Comments)   Infliximab Other (See Comments)   Methotrexate Other (See Comments)   Other Other (See Comments)   Statins Other (See Comments)   Pain        Medication List        Accurate as of January 22, 2021  2:11 PM. If you have any questions, ask your nurse or doctor.          albuterol 108 (90 Base) MCG/ACT inhaler Commonly known as: VENTOLIN HFA Inhale 2 puffs into the lungs every 6 (six) hours as needed.   aspirin 81 MG EC tablet Take 1 tablet by mouth daily.   aspirin 81 MG EC tablet Take by mouth.   CINNAMON PO Take 2,000 mg by mouth daily.   clopidogrel 75 MG tablet Commonly known as: PLAVIX Take 75 mg by mouth daily.   diltiazem 360 MG 24 hr capsule Commonly known as: TIAZAC Take 1 capsule (360 mg total) by mouth daily.   fexofenadine 60 MG tablet Commonly known as: ALLEGRA Take 60 mg by mouth daily.   folic acid 1 MG tablet Commonly known as: FOLVITE Take 2 mg by mouth daily.   furosemide 40 MG tablet Commonly known as: LASIX Take 40 mg by mouth daily.   gabapentin 100 MG capsule Commonly known as:  NEURONTIN Take by mouth.   hydrochlorothiazide 12.5 MG capsule Commonly known as: MICROZIDE Take 1 capsule (12.5 mg total) by mouth daily.   HYDROcodone bit-homatropine 5-1.5 MG/5ML syrup Commonly known as: HYCODAN Take by mouth.   HYDROcodone-acetaminophen 5-325 MG tablet Commonly known as: NORCO/VICODIN Take 1 tablet by mouth every 4 (four) hours as needed for moderate pain.   HYDROcodone-acetaminophen 10-325 MG tablet Commonly known as: NORCO Take by mouth.   ipratropium-albuterol 0.5-2.5 (3) MG/3ML Soln Commonly known as: DUONEB Take 3 mLs by nebulization every 4 (four) hours as needed.   IRON PO Take by mouth.   leflunomide 20 MG tablet Commonly known as: ARAVA Take 20 mg by mouth daily.   losartan 100 MG tablet Commonly known as: COZAAR Take 1 tablet (100 mg total) by mouth daily.   losartan 100 MG tablet Commonly known as: COZAAR Take by mouth.   Magnesium 250 MG Tabs Take by mouth.   MEGA MULTIVITAMIN FOR MEN PO Take 1 tablet by mouth daily.   metFORMIN 500 MG 24 hr tablet Commonly known as: GLUCOPHAGE-XR Take 1 tablet (500 mg total) by mouth 3 (three) times daily with meals.   methotrexate 50 MG/2ML injection Inject 20 mg into the skin once a week.   metoprolol succinate 25 MG 24 hr tablet Commonly known as: TOPROL-XL Take 1 tablet by mouth daily.   nebivolol 5 MG  tablet Commonly known as: BYSTOLIC Take 1 tablet by mouth daily.   pantoprazole 40 MG tablet Commonly known as: PROTONIX Take 1 tablet by mouth daily.   potassium chloride 10 MEQ CR capsule Commonly known as: MICRO-K Take 10 mEq by mouth daily.   predniSONE 5 MG tablet Commonly known as: DELTASONE Take 5 mg by mouth daily with breakfast.   pregabalin 50 MG capsule Commonly known as: LYRICA Take 50 mg by mouth 2 (two) times daily.   temazepam 15 MG capsule Commonly known as: RESTORIL Take 1 capsule (15 mg total) by mouth at bedtime.   traZODone 50 MG tablet Commonly  known as: DESYREL Take 1 tablet (50 mg total) by mouth at bedtime.         Home Care and Equipment/Supplies: Were home health services ordered? no If so, what is the name of the agency? Not applicable  Has the agency set up a time to come to the patient's home? not applicable Were any new equipment or medical supplies ordered?  No What is the name of the medical supply agency? Not applicable Were you able to get the supplies/equipment? not applicable Do you have any questions related to the use of the equipment or supplies? No  Functional Questionnaire: (I = Independent and D = Dependent) ADLs: Dependent  Bathing/Dressing- Independent   Cooking Dependent  Eating- Independent  Maintaining continence- Independent  Transferring/Ambulation- Independent  Managing Meds- Independent  Follow up appointments reviewed:  PCP Hospital f/u appt confirmed? Yes  Scheduled to see [name] on 02/14/21 @ 2:05 pm . [facility] f/u appt confirmed? No   Are transportation arrangements needed? No  If their condition worsens, is the pt aware to call PCP or go to the Emergency Dept.? Yes Was the patient provided with contact information for the PCP's office or ED? Yes Was to pt encouraged to call back with questions or concerns? Yes

## 2021-01-23 ENCOUNTER — Ambulatory Visit: Payer: Medicare Other | Admitting: Family Medicine

## 2021-01-23 DIAGNOSIS — Z7984 Long term (current) use of oral hypoglycemic drugs: Secondary | ICD-10-CM | POA: Diagnosis not present

## 2021-01-23 DIAGNOSIS — J189 Pneumonia, unspecified organism: Secondary | ICD-10-CM | POA: Diagnosis not present

## 2021-01-23 DIAGNOSIS — Z96612 Presence of left artificial shoulder joint: Secondary | ICD-10-CM | POA: Diagnosis not present

## 2021-01-23 DIAGNOSIS — I5032 Chronic diastolic (congestive) heart failure: Secondary | ICD-10-CM | POA: Diagnosis not present

## 2021-01-23 DIAGNOSIS — K219 Gastro-esophageal reflux disease without esophagitis: Secondary | ICD-10-CM | POA: Diagnosis not present

## 2021-01-23 DIAGNOSIS — I2510 Atherosclerotic heart disease of native coronary artery without angina pectoris: Secondary | ICD-10-CM | POA: Diagnosis not present

## 2021-01-23 DIAGNOSIS — Z87891 Personal history of nicotine dependence: Secondary | ICD-10-CM | POA: Diagnosis not present

## 2021-01-23 DIAGNOSIS — M069 Rheumatoid arthritis, unspecified: Secondary | ICD-10-CM | POA: Diagnosis not present

## 2021-01-23 DIAGNOSIS — Z7902 Long term (current) use of antithrombotics/antiplatelets: Secondary | ICD-10-CM | POA: Diagnosis not present

## 2021-01-23 DIAGNOSIS — Z79891 Long term (current) use of opiate analgesic: Secondary | ICD-10-CM | POA: Diagnosis not present

## 2021-01-23 DIAGNOSIS — I110 Hypertensive heart disease with heart failure: Secondary | ICD-10-CM | POA: Diagnosis not present

## 2021-01-23 DIAGNOSIS — Z7982 Long term (current) use of aspirin: Secondary | ICD-10-CM | POA: Diagnosis not present

## 2021-01-23 DIAGNOSIS — I480 Paroxysmal atrial fibrillation: Secondary | ICD-10-CM | POA: Diagnosis not present

## 2021-01-23 DIAGNOSIS — Z950 Presence of cardiac pacemaker: Secondary | ICD-10-CM | POA: Diagnosis not present

## 2021-01-23 DIAGNOSIS — Z9181 History of falling: Secondary | ICD-10-CM | POA: Diagnosis not present

## 2021-01-23 DIAGNOSIS — Z95818 Presence of other cardiac implants and grafts: Secondary | ICD-10-CM | POA: Diagnosis not present

## 2021-01-25 DIAGNOSIS — I5032 Chronic diastolic (congestive) heart failure: Secondary | ICD-10-CM | POA: Diagnosis not present

## 2021-01-25 DIAGNOSIS — I480 Paroxysmal atrial fibrillation: Secondary | ICD-10-CM | POA: Diagnosis not present

## 2021-01-25 DIAGNOSIS — M069 Rheumatoid arthritis, unspecified: Secondary | ICD-10-CM | POA: Diagnosis not present

## 2021-01-25 DIAGNOSIS — J189 Pneumonia, unspecified organism: Secondary | ICD-10-CM | POA: Diagnosis not present

## 2021-01-25 DIAGNOSIS — I110 Hypertensive heart disease with heart failure: Secondary | ICD-10-CM | POA: Diagnosis not present

## 2021-01-25 DIAGNOSIS — I2510 Atherosclerotic heart disease of native coronary artery without angina pectoris: Secondary | ICD-10-CM | POA: Diagnosis not present

## 2021-01-26 DIAGNOSIS — I480 Paroxysmal atrial fibrillation: Secondary | ICD-10-CM | POA: Diagnosis not present

## 2021-01-26 DIAGNOSIS — J189 Pneumonia, unspecified organism: Secondary | ICD-10-CM | POA: Diagnosis not present

## 2021-01-26 DIAGNOSIS — I2510 Atherosclerotic heart disease of native coronary artery without angina pectoris: Secondary | ICD-10-CM | POA: Diagnosis not present

## 2021-01-26 DIAGNOSIS — I5032 Chronic diastolic (congestive) heart failure: Secondary | ICD-10-CM | POA: Diagnosis not present

## 2021-01-26 DIAGNOSIS — I110 Hypertensive heart disease with heart failure: Secondary | ICD-10-CM | POA: Diagnosis not present

## 2021-01-26 DIAGNOSIS — M069 Rheumatoid arthritis, unspecified: Secondary | ICD-10-CM | POA: Diagnosis not present

## 2021-01-30 ENCOUNTER — Other Ambulatory Visit: Payer: Self-pay | Admitting: Family Medicine

## 2021-01-30 ENCOUNTER — Other Ambulatory Visit: Payer: Self-pay

## 2021-01-30 ENCOUNTER — Ambulatory Visit (INDEPENDENT_AMBULATORY_CARE_PROVIDER_SITE_OTHER): Payer: Medicare Other

## 2021-01-30 DIAGNOSIS — J189 Pneumonia, unspecified organism: Secondary | ICD-10-CM

## 2021-01-30 DIAGNOSIS — I110 Hypertensive heart disease with heart failure: Secondary | ICD-10-CM | POA: Diagnosis not present

## 2021-01-30 DIAGNOSIS — Z7902 Long term (current) use of antithrombotics/antiplatelets: Secondary | ICD-10-CM

## 2021-01-30 DIAGNOSIS — I2510 Atherosclerotic heart disease of native coronary artery without angina pectoris: Secondary | ICD-10-CM | POA: Diagnosis not present

## 2021-01-30 DIAGNOSIS — Z95818 Presence of other cardiac implants and grafts: Secondary | ICD-10-CM

## 2021-01-30 DIAGNOSIS — I480 Paroxysmal atrial fibrillation: Secondary | ICD-10-CM | POA: Diagnosis not present

## 2021-01-30 DIAGNOSIS — I5032 Chronic diastolic (congestive) heart failure: Secondary | ICD-10-CM

## 2021-01-30 DIAGNOSIS — M069 Rheumatoid arthritis, unspecified: Secondary | ICD-10-CM | POA: Diagnosis not present

## 2021-01-30 DIAGNOSIS — Z9181 History of falling: Secondary | ICD-10-CM

## 2021-01-30 DIAGNOSIS — Z7982 Long term (current) use of aspirin: Secondary | ICD-10-CM | POA: Diagnosis not present

## 2021-01-30 DIAGNOSIS — Z950 Presence of cardiac pacemaker: Secondary | ICD-10-CM | POA: Diagnosis not present

## 2021-01-30 DIAGNOSIS — F5101 Primary insomnia: Secondary | ICD-10-CM

## 2021-01-30 DIAGNOSIS — K219 Gastro-esophageal reflux disease without esophagitis: Secondary | ICD-10-CM

## 2021-01-30 DIAGNOSIS — Z79891 Long term (current) use of opiate analgesic: Secondary | ICD-10-CM

## 2021-01-30 DIAGNOSIS — Z96612 Presence of left artificial shoulder joint: Secondary | ICD-10-CM

## 2021-01-30 DIAGNOSIS — Z87891 Personal history of nicotine dependence: Secondary | ICD-10-CM

## 2021-01-30 DIAGNOSIS — Z7984 Long term (current) use of oral hypoglycemic drugs: Secondary | ICD-10-CM

## 2021-01-30 DIAGNOSIS — F332 Major depressive disorder, recurrent severe without psychotic features: Secondary | ICD-10-CM

## 2021-01-31 ENCOUNTER — Ambulatory Visit: Payer: Medicare Other | Admitting: Family Medicine

## 2021-01-31 DIAGNOSIS — I110 Hypertensive heart disease with heart failure: Secondary | ICD-10-CM | POA: Diagnosis not present

## 2021-01-31 DIAGNOSIS — I2510 Atherosclerotic heart disease of native coronary artery without angina pectoris: Secondary | ICD-10-CM | POA: Diagnosis not present

## 2021-01-31 DIAGNOSIS — I5032 Chronic diastolic (congestive) heart failure: Secondary | ICD-10-CM | POA: Diagnosis not present

## 2021-01-31 DIAGNOSIS — I480 Paroxysmal atrial fibrillation: Secondary | ICD-10-CM | POA: Diagnosis not present

## 2021-01-31 DIAGNOSIS — M069 Rheumatoid arthritis, unspecified: Secondary | ICD-10-CM | POA: Diagnosis not present

## 2021-01-31 DIAGNOSIS — J189 Pneumonia, unspecified organism: Secondary | ICD-10-CM | POA: Diagnosis not present

## 2021-02-02 ENCOUNTER — Telehealth: Payer: Self-pay

## 2021-02-02 DIAGNOSIS — I110 Hypertensive heart disease with heart failure: Secondary | ICD-10-CM | POA: Diagnosis not present

## 2021-02-02 DIAGNOSIS — J189 Pneumonia, unspecified organism: Secondary | ICD-10-CM | POA: Diagnosis not present

## 2021-02-02 DIAGNOSIS — I2510 Atherosclerotic heart disease of native coronary artery without angina pectoris: Secondary | ICD-10-CM | POA: Diagnosis not present

## 2021-02-02 DIAGNOSIS — I5032 Chronic diastolic (congestive) heart failure: Secondary | ICD-10-CM | POA: Diagnosis not present

## 2021-02-02 DIAGNOSIS — I480 Paroxysmal atrial fibrillation: Secondary | ICD-10-CM | POA: Diagnosis not present

## 2021-02-02 DIAGNOSIS — M069 Rheumatoid arthritis, unspecified: Secondary | ICD-10-CM | POA: Diagnosis not present

## 2021-02-02 NOTE — Telephone Encounter (Signed)
Please call, patient no longer needs OT

## 2021-02-06 DIAGNOSIS — I480 Paroxysmal atrial fibrillation: Secondary | ICD-10-CM | POA: Diagnosis not present

## 2021-02-06 DIAGNOSIS — I5032 Chronic diastolic (congestive) heart failure: Secondary | ICD-10-CM | POA: Diagnosis not present

## 2021-02-06 DIAGNOSIS — M069 Rheumatoid arthritis, unspecified: Secondary | ICD-10-CM | POA: Diagnosis not present

## 2021-02-06 DIAGNOSIS — I110 Hypertensive heart disease with heart failure: Secondary | ICD-10-CM | POA: Diagnosis not present

## 2021-02-06 DIAGNOSIS — J189 Pneumonia, unspecified organism: Secondary | ICD-10-CM | POA: Diagnosis not present

## 2021-02-06 DIAGNOSIS — I2510 Atherosclerotic heart disease of native coronary artery without angina pectoris: Secondary | ICD-10-CM | POA: Diagnosis not present

## 2021-02-06 NOTE — Telephone Encounter (Signed)
LMOVM thanking her for letting us know that pt no longer needs OT, if there is anything else she needs for pt to please let me know & gave her my direct #

## 2021-02-07 ENCOUNTER — Other Ambulatory Visit: Payer: Self-pay | Admitting: Family Medicine

## 2021-02-07 DIAGNOSIS — F5101 Primary insomnia: Secondary | ICD-10-CM

## 2021-02-09 DIAGNOSIS — I480 Paroxysmal atrial fibrillation: Secondary | ICD-10-CM | POA: Diagnosis not present

## 2021-02-09 DIAGNOSIS — I110 Hypertensive heart disease with heart failure: Secondary | ICD-10-CM | POA: Diagnosis not present

## 2021-02-09 DIAGNOSIS — I2510 Atherosclerotic heart disease of native coronary artery without angina pectoris: Secondary | ICD-10-CM | POA: Diagnosis not present

## 2021-02-09 DIAGNOSIS — J189 Pneumonia, unspecified organism: Secondary | ICD-10-CM | POA: Diagnosis not present

## 2021-02-09 DIAGNOSIS — I5032 Chronic diastolic (congestive) heart failure: Secondary | ICD-10-CM | POA: Diagnosis not present

## 2021-02-09 DIAGNOSIS — M069 Rheumatoid arthritis, unspecified: Secondary | ICD-10-CM | POA: Diagnosis not present

## 2021-02-14 ENCOUNTER — Encounter: Payer: Self-pay | Admitting: Family Medicine

## 2021-02-14 ENCOUNTER — Other Ambulatory Visit: Payer: Self-pay

## 2021-02-14 ENCOUNTER — Ambulatory Visit (INDEPENDENT_AMBULATORY_CARE_PROVIDER_SITE_OTHER): Payer: Medicare Other

## 2021-02-14 ENCOUNTER — Ambulatory Visit (INDEPENDENT_AMBULATORY_CARE_PROVIDER_SITE_OTHER): Payer: Medicare Other | Admitting: Family Medicine

## 2021-02-14 VITALS — BP 135/81 | HR 88 | Temp 98.2°F | Ht 66.0 in | Wt 176.0 lb

## 2021-02-14 DIAGNOSIS — J849 Interstitial pulmonary disease, unspecified: Secondary | ICD-10-CM

## 2021-02-14 DIAGNOSIS — I509 Heart failure, unspecified: Secondary | ICD-10-CM | POA: Diagnosis not present

## 2021-02-14 DIAGNOSIS — F5101 Primary insomnia: Secondary | ICD-10-CM | POA: Diagnosis not present

## 2021-02-14 DIAGNOSIS — F332 Major depressive disorder, recurrent severe without psychotic features: Secondary | ICD-10-CM

## 2021-02-14 DIAGNOSIS — R5383 Other fatigue: Secondary | ICD-10-CM

## 2021-02-14 DIAGNOSIS — I517 Cardiomegaly: Secondary | ICD-10-CM | POA: Diagnosis not present

## 2021-02-14 DIAGNOSIS — J90 Pleural effusion, not elsewhere classified: Secondary | ICD-10-CM | POA: Diagnosis not present

## 2021-02-14 DIAGNOSIS — I5032 Chronic diastolic (congestive) heart failure: Secondary | ICD-10-CM | POA: Diagnosis not present

## 2021-02-14 DIAGNOSIS — R0989 Other specified symptoms and signs involving the circulatory and respiratory systems: Secondary | ICD-10-CM | POA: Diagnosis not present

## 2021-02-14 MED ORDER — BELSOMRA 10 MG PO TABS: 10.0000 mg | ORAL_TABLET | Freq: Every evening | ORAL | 2 refills | Status: DC | PRN

## 2021-02-14 MED ORDER — FLUOXETINE HCL 20 MG PO CAPS: 20.0000 mg | ORAL_CAPSULE | Freq: Every day | ORAL | 2 refills | Status: DC

## 2021-02-14 NOTE — Patient Instructions (Signed)
Decrease Trazodone to 25 mg at bedtime x2 weeks, then decrease to every other bedtime x2 weeks, then stop. Go ahead and start Belsomra and Prozac.

## 2021-02-14 NOTE — Progress Notes (Signed)
Assessment & Plan:  1. Chronic diastolic heart failure ([place]) Keep appointment with cardiology next month. - Anemia Profile B - CMP14+EGFR - Sedimentation rate - Magnesium  2. ILD (interstitial lung disease) ([place]) Keep appointment with pulmonology next month. Continue wearing oxygen as needed.  3. Severe episode of recurrent major depressive disorder, without psychotic features ([place]) Uncontrolled. Started Prozac. - FLUoxetine (PROZAC) 20 MG capsule; Take 1 capsule (20 mg total) by mouth daily.  Dispense: 30 capsule; Refill: 2 - CMP14+EGFR  4. Primary insomnia Uncontrolled. Started Belsomra. Wean off Trazodone by decreasing to 25 mg at bedtime x2 weeks, then decrease to every other bedtime x2 weeks, then stop.  - Suvorexant (BELSOMRA) 10 MG TABS; Take 10 mg by mouth at bedtime as needed.  Dispense: 30 tablet; Refill: 2 - CMP14+EGFR  5. Fatigue, unspecified type - Anemia Profile B - CMP14+EGFR  6. Abnormal lung sounds - DG Chest 2 View   Return as scheduled.  [name], MSN, APRN, FNP-C [facility]  Subjective:    Patient ID: Adam Watts, male    DOB: [birth year], 70 y.o.   MRN: [id number]  Patient Care Team: Loman Brooklyn, FNP as PCP - General (Family Medicine) Evans Lance, MD as PCP - Cardiology (Cardiology) Josue Hector, MD as Consulting Physician (Cardiology) Evans Lance, MD as Consulting Physician (Cardiology) Harlen Labs, MD as Referring Physician (Optometry)   Chief Complaint:  Chief Complaint  Patient presents with   Transitions Of Care    [facility] - CHF/ acute respiratory failure. Patient states he has fatigue and not sleeping at night.  Also states his mood is worse.     HPI: Adam Watts is a 70 y.o. male presenting on 02/14/2021 for Transitions Of Care ([facility] - CHF/ acute respiratory failure. Patient states he has fatigue and not sleeping at night.  Also states his mood is worse. )  Patient is  accompanied by his wife, who he is okay with being present.  Patient was admitted to [facility] 01/14/2021-01/22/2021 due to acute on chronic heart failure and acute respiratory failure.  He was treated with Augmentin, Lasix, and a higher dose of diltiazem while in the hospital.  He was advised to follow-up with pulmonology and cardiology at discharge.  Home oxygen was already in place.  Home health was ordered as patient refused SNF.  His pulmonologist appointment is on 03/22/2021.  His cardiologist appointment is on 03/26/2021.  He does wear his home oxygen at night.  He is taking Robitussin and Coricidin as needed for cough.  Overall he is feeling much better except he feels fatigued, is not sleeping well, and he feels like his mood is worse.  He has previously failed treatment with Remeron for sleep.  He does not feel like the temazepam or Trazodone he is currently taking are working for sleep or his mood.  The only medication he has failed in the past for his mood was Paxil; he states it made him feel like a zombie.  Depression screen [facility] 2/9 02/14/2021 12/20/2020 11/27/2020  Decreased Interest 2 1 0  Down, Depressed, Hopeless _0 PHQ - 2 Score _1 Altered sleeping _2 Tired, decreased energy 0 2 3  Change in appetite 0 2 2  Feeling bad or failure about yourself  _3 Trouble concentrating 0 3 2  Moving slowly or fidgety/restless 2 0 1  Suicidal thoughts 2 1 1  PHQ-9 Score _0 Difficult doing work/chores Very difficult Very difficult Very difficult   GAD 7 : Generalized Anxiety Score 02/14/2021 11/27/2020  Nervous, Anxious, on Edge 3 2  Control/stop worrying 3 3  Worry too much - different things 3 2  Trouble relaxing 3 3  Restless 3 2  Easily annoyed or irritable 3 3  Afraid - awful might happen 3 1  Total GAD 7 Score 21 16  Anxiety Difficulty Extremely difficult Somewhat difficult    Social history:  Relevant past medical, surgical, family and social history reviewed and  updated as indicated. Interim medical history since our last visit reviewed.  Allergies and medications reviewed and updated.  DATA REVIEWED: CHART IN EPIC  ROS: Negative unless specifically indicated above in HPI.    Current Outpatient Medications:    albuterol (VENTOLIN HFA) 108 (90 Base) MCG/ACT inhaler, Inhale 2 puffs into the lungs every 6 (six) hours as needed., Disp: 18 g, Rfl: 2   aspirin 81 MG EC tablet, Take 1 tablet by mouth daily., Disp: , Rfl:    aspirin 81 MG EC tablet, Take by mouth., Disp: , Rfl:    CINNAMON PO, Take 2,000 mg by mouth daily. , Disp: , Rfl:    clopidogrel (PLAVIX) 75 MG tablet, Take 75 mg by mouth daily., Disp: , Rfl:    diltiazem (TIAZAC) 360 MG 24 hr capsule, Take 1 capsule (360 mg total) by mouth daily., Disp: 90 capsule, Rfl: 1   Ferrous Sulfate (IRON PO), Take by mouth., Disp: , Rfl:    fexofenadine (ALLEGRA) 60 MG tablet, Take 60 mg by mouth daily., Disp: , Rfl:    folic acid (FOLVITE) 1 MG tablet, Take 2 mg by mouth daily., Disp: , Rfl:    furosemide (LASIX) 40 MG tablet, Take 40 mg by mouth daily., Disp: , Rfl:    gabapentin (NEURONTIN) 100 MG capsule, Take by mouth., Disp: , Rfl:    hydrochlorothiazide (MICROZIDE) 12.5 MG capsule, Take 1 capsule (12.5 mg total) by mouth daily., Disp: 90 capsule, Rfl: 1   HYDROcodone-acetaminophen (NORCO) 10-325 MG tablet, Take by mouth., Disp: , Rfl:    ipratropium-albuterol (DUONEB) 0.5-2.5 (3) MG/3ML SOLN, Take 3 mLs by nebulization every 4 (four) hours as needed., Disp: 360 mL, Rfl: 2   leflunomide (ARAVA) 20 MG tablet, Take 20 mg by mouth daily., Disp: , Rfl:    losartan (COZAAR) 100 MG tablet, Take 1 tablet (100 mg total) by mouth daily., Disp: 90 tablet, Rfl: 1   losartan (COZAAR) 100 MG tablet, Take by mouth., Disp: , Rfl:    Magnesium 250 MG TABS, Take by mouth., Disp: , Rfl:    metFORMIN (GLUCOPHAGE-XR) 500 MG 24 hr tablet, Take 1 tablet (500 mg total) by mouth 3 (three) times daily with meals., Disp: 270  tablet, Rfl: 1   methotrexate 50 MG/2ML injection, Inject 20 mg into the skin once a week., Disp: , Rfl:    metoprolol succinate (TOPROL-XL) 25 MG 24 hr tablet, Take 1 tablet by mouth daily., Disp: , Rfl:    Multiple Vitamins-Minerals (MEGA MULTIVITAMIN FOR MEN PO), Take 1 tablet by mouth daily. , Disp: , Rfl:    nebivolol (BYSTOLIC) 5 MG tablet, Take 1 tablet by mouth daily., Disp: , Rfl:    pantoprazole (PROTONIX) 40 MG tablet, Take 1 tablet by mouth daily., Disp: , Rfl:    potassium chloride (MICRO-K) 10 MEQ CR capsule, Take 10 mEq by mouth daily., Disp: , Rfl:  pregabalin (LYRICA) 50 MG capsule, Take 50 mg by mouth 2 (two) times daily., Disp: , Rfl:    tamsulosin (FLOMAX) 0.4 MG CAPS capsule, Take 0.4 mg by mouth daily., Disp: , Rfl:    temazepam (RESTORIL) 15 MG capsule, Take 1 capsule (15 mg total) by mouth at bedtime., Disp: 30 capsule, Rfl: 0   traZODone (DESYREL) 50 MG tablet, TAKE 1 TABLET BY MOUTH AT BEDTIME, Disp: 30 tablet, Rfl: 2 No current facility-administered medications for this visit.  Facility-Administered Medications Ordered in Other Visits:    ondansetron (ZOFRAN) 4 mg in sodium chloride 0.9 % 50 mL IVPB, 4 mg, Intravenous, Q6H PRN, Ashok Pall, MD   Allergies  Allergen Reactions   Metoprolol Shortness Of Breath    Patient reports SOB while taking   Abatacept Other (See Comments)   Infliximab Other (See Comments)   Methotrexate Other (See Comments)   Other Other (See Comments)   Statins Other (See Comments)    Pain   Past Medical History:  Diagnosis Date   A-fib ([place])    Chest pain    Chronic back pain    Diabetic nephropathy ([place]) 09/17/2020   Dysrhythmia    a-fib   Fracture of lateral malleolus 02/11/2015   Gastro-esophageal reflux disease with esophagitis    Hyperlipidemia, unspecified    Hypertension    Insomnia, unspecified    Lumbar radiculopathy    Nicotine dependence, unspecified, uncomplicated    Pacemaker    CHB, PAF   Pain in left  shoulder    Pain in right leg    Pneumonia    Pre-diabetes    Presence of cardiac pacemaker    Rheumatoid arthritis, unspecified ([place])    Type 2 diabetes mellitus without complications ([place])    Unilateral inguinal hernia, without obstruction or gangrene, not specified as recurrent     Past Surgical History:  Procedure Laterality Date   CHOLECYSTECTOMY     DENTAL SURGERY     implants   INGUINAL HERNIA REPAIR Bilateral 04/24/2016   Procedure: LAPAROSCOPIC BILATERAL INGUINAL HERNIA REPAIR;  Surgeon: Coralie Keens, MD;  Location: [place];  Service: General;  Laterality: Bilateral;   INSERTION OF MESH Bilateral 04/24/2016   Procedure: INSERTION OF MESH;  Surgeon: Coralie Keens, MD;  Location: [place];  Service: General;  Laterality: Bilateral;   LEFT ATRIAL APPENDAGE OCCLUSION     LUMBAR LAMINECTOMY/DECOMPRESSION MICRODISCECTOMY Left 04/10/2018   Procedure: Left Lumbar two-three Laminectomy/Foraminotomy;  Surgeon: Ashok Pall, MD;  Location: [place];  Service: Neurosurgery;  Laterality: Left;   REVERSE SHOULDER ARTHROPLASTY Left 02/24/2019   Procedure: REVERSE TOTAL SHOULDER ARTHROPLASTY;  Surgeon: Hiram Gash, MD;  Location: [facility];  Service: Orthopedics;  Laterality: Left;   SHOULDER ARTHROSCOPY Right    x2   VASECTOMY      Social History   Socioeconomic History   Marital status: Married    Spouse name: Not on file   Number of children: 2   Years of education: Not on file   Highest education level: Not on file  Occupational History   Not on file  Tobacco Use   Smoking status: Former    Packs/day: 1.00    Years: 25.00    Pack years: 25.00    Types: Cigarettes    Quit date: 02/15/2020    Years since quitting: 1.0   Smokeless tobacco: Never  Vaping Use   Vaping Use: Never used  Substance and Sexual Activity   Alcohol use: Yes  Comment: social   Drug use: No   Sexual activity: Yes    Birth control/protection: None  Other Topics  Concern   Not on file  Social History Narrative   Lives home with wife - children live nearby   Social Determinants of Health   Financial Resource Strain: Low Risk    Difficulty of Paying Living Expenses: Not hard at all  Food Insecurity: No Food Insecurity   Worried About Charity fundraiser in the Last Year: Never true   Arboriculturist in the Last Year: Never true  Transportation Needs: No Transportation Needs   Lack of Transportation (Medical): No   Lack of Transportation (Non-Medical): No  Physical Activity: Insufficiently Active   Days of Exercise per Week: 7 days   Minutes of Exercise per Session: 10 min  Stress: Stress Concern Present   Feeling of Stress : Very much  Social Connections: Moderately Integrated   Frequency of Communication with Friends and Family: More than three times a week   Frequency of Social Gatherings with Friends and Family: Once a week   Attends Religious Services: 1 to 4 times per year   Active Member of [employer] or Organizations: No   Attends Archivist Meetings: Never   Marital Status: Married  Human resources officer Violence: Not At Risk   Fear of Current or Ex-Partner: No   Emotionally Abused: No   Physically Abused: No   Sexually Abused: No        Objective:    BP 135/81   Pulse 88   Temp 98.2 F (36.8 C) (Temporal)   Ht _0  (1.676 m)   Wt 176 lb (79.8 kg)   SpO2 92%   BMI 28.41 kg/m   Wt Readings from Last 3 Encounters:  02/14/21 176 lb (79.8 kg)  12/20/20 178 lb (80.7 kg)  11/27/20 179 lb (81.2 kg)    Physical Exam Vitals reviewed.  Constitutional:      General: He is not in acute distress.    Appearance: Normal appearance. He is overweight. He is not ill-appearing, toxic-appearing or diaphoretic.  HENT:     Head: Normocephalic and atraumatic.  Eyes:     General: No scleral icterus.       Right eye: No discharge.        Left eye: No discharge.     Conjunctiva/sclera: Conjunctivae normal.  Cardiovascular:      Rate and Rhythm: Normal rate and regular rhythm.     Heart sounds: Normal heart sounds. No murmur heard.   No friction rub. No gallop.  Pulmonary:     Effort: Pulmonary effort is normal. No respiratory distress.     Breath sounds: No stridor. Rales present. No wheezing or rhonchi.  Musculoskeletal:        General: Normal range of motion.     Cervical back: Normal range of motion.  Skin:    General: Skin is warm and dry.  Neurological:     Mental Status: He is alert and oriented to person, place, and time. Mental status is at baseline.  Psychiatric:        Mood and Affect: Mood normal.        Behavior: Behavior normal.        Thought Content: Thought content normal.        Judgment: Judgment normal.    Lab Results  Component Value Date   TSH 3.610 04/26/2020   Lab Results  Component Value Date  WBC 9.4 09/15/2020   WBC 9.4 09/15/2020   HGB 11.9 (L) 09/15/2020   HGB 11.9 (L) 09/15/2020   HCT 37.0 (L) 09/15/2020   HCT 37.0 (L) 09/15/2020   MCV 78 (L) 09/15/2020   MCV 78 (L) 09/15/2020   PLT 313 09/15/2020   PLT 313 09/15/2020   Lab Results  Component Value Date   NA 138 09/15/2020   K 3.8 09/15/2020   CO2 22 09/15/2020   GLUCOSE 340 (H) 09/15/2020   BUN 9 09/15/2020   CREATININE 0.97 09/15/2020   BILITOT <0.2 09/15/2020   ALKPHOS 125 (H) 09/15/2020   AST 8 09/15/2020   ALT 9 09/15/2020   PROT 6.6 09/15/2020   ALBUMIN 3.8 09/15/2020   CALCIUM 9.1 09/15/2020   ANIONGAP 13 02/15/2019   Lab Results  Component Value Date   CHOL 201 (H) 09/15/2020   Lab Results  Component Value Date   HDL 50 09/15/2020   Lab Results  Component Value Date   LDLCALC 98 09/15/2020   Lab Results  Component Value Date   TRIG 317 (H) 09/15/2020   Lab Results  Component Value Date   CHOLHDL 4.0 09/15/2020   Lab Results  Component Value Date   HGBA1C 7.2 (H) 11/27/2020

## 2021-02-15 LAB — CMP14+EGFR
ALT: 5 IU/L (ref 0–44)
AST: 8 IU/L (ref 0–40)
Albumin/Globulin Ratio: 1.5 (ref 1.2–2.2)
Albumin: 3.9 g/dL (ref 3.7–4.7)
Alkaline Phosphatase: 146 IU/L — ABNORMAL HIGH (ref 44–121)
BUN/Creatinine Ratio: 8 — ABNORMAL LOW (ref 10–24)
BUN: 9 mg/dL (ref 8–27)
Bilirubin Total: <0.2 mg/dL (ref 0.0–1.2)
CO2: 23 mmol/L (ref 20–29)
Calcium: 9.3 mg/dL (ref 8.6–10.2)
Chloride: 101 mmol/L (ref 96–106)
Creatinine, Ser: 1.17 mg/dL (ref 0.76–1.27)
Globulin, Total: 2.6 g/dL (ref 1.5–4.5)
Glucose: 120 mg/dL — ABNORMAL HIGH (ref 65–99)
Potassium: 3.8 mmol/L (ref 3.5–5.2)
Sodium: 143 mmol/L (ref 134–144)
Total Protein: 6.5 g/dL (ref 6.0–8.5)
eGFR: 67 mL/min/{1.73_m2} (ref >59)

## 2021-02-15 LAB — ANEMIA PROFILE B
Basophils Absolute: 0.2 10*3/uL (ref 0.0–0.2)
Basos: 2 %
EOS (ABSOLUTE): 0.4 10*3/uL (ref 0.0–0.4)
Eos: 4 %
Ferritin: 98 ng/mL (ref 30–400)
Folate: >20 ng/mL (ref >3.0)
Hematocrit: 32.7 % — ABNORMAL LOW (ref 37.5–51.0)
Hemoglobin: 10 g/dL — ABNORMAL LOW (ref 13.0–17.7)
Immature Grans (Abs): 0.1 10*3/uL (ref 0.0–0.1)
Immature Granulocytes: 1 %
Iron Saturation: 9 % — CL (ref 15–55)
Iron: 25 ug/dL — ABNORMAL LOW (ref 38–169)
Lymphocytes Absolute: 1.9 10*3/uL (ref 0.7–3.1)
Lymphs: 15 %
MCH: 23 pg — ABNORMAL LOW (ref 26.6–33.0)
MCHC: 30.6 g/dL — ABNORMAL LOW (ref 31.5–35.7)
MCV: 75 fL — ABNORMAL LOW (ref 79–97)
Monocytes Absolute: 1 10*3/uL — ABNORMAL HIGH (ref 0.1–0.9)
Monocytes: 8 %
Neutrophils Absolute: 8.6 10*3/uL — ABNORMAL HIGH (ref 1.4–7.0)
Neutrophils: 70 %
Platelets: 390 10*3/uL (ref 150–450)
RBC: 4.35 x10E6/uL (ref 4.14–5.80)
RDW: 19.9 % — ABNORMAL HIGH (ref 11.6–15.4)
Retic Ct Pct: 1.4 % (ref 0.6–2.6)
Total Iron Binding Capacity: 286 ug/dL (ref 250–450)
UIBC: 261 ug/dL (ref 111–343)
Vitamin B-12: 356 pg/mL (ref 232–1245)
WBC: 12.2 10*3/uL — ABNORMAL HIGH (ref 3.4–10.8)

## 2021-02-15 LAB — SEDIMENTATION RATE: Sed Rate: 109 mm/hr — ABNORMAL HIGH (ref 0–30)

## 2021-02-15 LAB — MAGNESIUM: Magnesium: 1.6 mg/dL (ref 1.6–2.3)

## 2021-02-20 ENCOUNTER — Encounter: Payer: Self-pay | Admitting: Family Medicine

## 2021-02-21 ENCOUNTER — Telehealth: Payer: Self-pay | Admitting: *Deleted

## 2021-02-21 DIAGNOSIS — M069 Rheumatoid arthritis, unspecified: Secondary | ICD-10-CM | POA: Diagnosis not present

## 2021-02-21 DIAGNOSIS — I2510 Atherosclerotic heart disease of native coronary artery without angina pectoris: Secondary | ICD-10-CM | POA: Diagnosis not present

## 2021-02-21 DIAGNOSIS — I5032 Chronic diastolic (congestive) heart failure: Secondary | ICD-10-CM | POA: Diagnosis not present

## 2021-02-21 DIAGNOSIS — I480 Paroxysmal atrial fibrillation: Secondary | ICD-10-CM | POA: Diagnosis not present

## 2021-02-21 DIAGNOSIS — I110 Hypertensive heart disease with heart failure: Secondary | ICD-10-CM | POA: Diagnosis not present

## 2021-02-21 DIAGNOSIS — J189 Pneumonia, unspecified organism: Secondary | ICD-10-CM | POA: Diagnosis not present

## 2021-02-21 NOTE — Telephone Encounter (Signed)
Aware   [facility] - phone - [phone]         [phone]  May need these numbers for future orders to have drawn near vacation home

## 2021-02-21 NOTE — Telephone Encounter (Signed)
TC from [facility]/ [facility] Calling with interaction of plavix & fluoxetine Pt has increase in pain all over, Lyrica is not helping much Belsomra is not working for his sleep Reporting increase in SOB w/ excertion just started last couple days O2 with nurse has been about 94. Pt reported 91-93 at rest but a 82 O2 after making coffee then going to sit down He is not on O2.

## 2021-02-21 NOTE — Telephone Encounter (Signed)
I can address his medications at his visit next week. He does have home oxygen, which it sounds like he needs to be wearing with activity. I believe he was previously only wearing it at night.

## 2021-02-22 DIAGNOSIS — I110 Hypertensive heart disease with heart failure: Secondary | ICD-10-CM | POA: Diagnosis not present

## 2021-02-22 DIAGNOSIS — I480 Paroxysmal atrial fibrillation: Secondary | ICD-10-CM | POA: Diagnosis not present

## 2021-02-22 DIAGNOSIS — Z95818 Presence of other cardiac implants and grafts: Secondary | ICD-10-CM | POA: Diagnosis not present

## 2021-02-22 DIAGNOSIS — I5032 Chronic diastolic (congestive) heart failure: Secondary | ICD-10-CM | POA: Diagnosis not present

## 2021-02-22 DIAGNOSIS — Z950 Presence of cardiac pacemaker: Secondary | ICD-10-CM | POA: Diagnosis not present

## 2021-02-22 DIAGNOSIS — Z9181 History of falling: Secondary | ICD-10-CM | POA: Diagnosis not present

## 2021-02-22 DIAGNOSIS — Z7984 Long term (current) use of oral hypoglycemic drugs: Secondary | ICD-10-CM | POA: Diagnosis not present

## 2021-02-22 DIAGNOSIS — Z7902 Long term (current) use of antithrombotics/antiplatelets: Secondary | ICD-10-CM | POA: Diagnosis not present

## 2021-02-22 DIAGNOSIS — Z79891 Long term (current) use of opiate analgesic: Secondary | ICD-10-CM | POA: Diagnosis not present

## 2021-02-22 DIAGNOSIS — Z96612 Presence of left artificial shoulder joint: Secondary | ICD-10-CM | POA: Diagnosis not present

## 2021-02-22 DIAGNOSIS — I2510 Atherosclerotic heart disease of native coronary artery without angina pectoris: Secondary | ICD-10-CM | POA: Diagnosis not present

## 2021-02-22 DIAGNOSIS — Z87891 Personal history of nicotine dependence: Secondary | ICD-10-CM | POA: Diagnosis not present

## 2021-02-22 DIAGNOSIS — Z7982 Long term (current) use of aspirin: Secondary | ICD-10-CM | POA: Diagnosis not present

## 2021-02-22 DIAGNOSIS — K219 Gastro-esophageal reflux disease without esophagitis: Secondary | ICD-10-CM | POA: Diagnosis not present

## 2021-02-22 DIAGNOSIS — M069 Rheumatoid arthritis, unspecified: Secondary | ICD-10-CM | POA: Diagnosis not present

## 2021-02-22 DIAGNOSIS — J189 Pneumonia, unspecified organism: Secondary | ICD-10-CM | POA: Diagnosis not present

## 2021-02-26 DIAGNOSIS — R0902 Hypoxemia: Secondary | ICD-10-CM | POA: Diagnosis not present

## 2021-02-26 DIAGNOSIS — J90 Pleural effusion, not elsewhere classified: Secondary | ICD-10-CM | POA: Diagnosis not present

## 2021-02-26 DIAGNOSIS — R079 Chest pain, unspecified: Secondary | ICD-10-CM | POA: Diagnosis not present

## 2021-02-26 DIAGNOSIS — J158 Pneumonia due to other specified bacteria: Secondary | ICD-10-CM | POA: Diagnosis not present

## 2021-02-26 DIAGNOSIS — E119 Type 2 diabetes mellitus without complications: Secondary | ICD-10-CM | POA: Diagnosis present

## 2021-02-26 DIAGNOSIS — Z888 Allergy status to other drugs, medicaments and biological substances status: Secondary | ICD-10-CM | POA: Diagnosis not present

## 2021-02-26 DIAGNOSIS — Z955 Presence of coronary angioplasty implant and graft: Secondary | ICD-10-CM | POA: Diagnosis not present

## 2021-02-26 DIAGNOSIS — Z9981 Dependence on supplemental oxygen: Secondary | ICD-10-CM | POA: Diagnosis not present

## 2021-02-26 DIAGNOSIS — J811 Chronic pulmonary edema: Secondary | ICD-10-CM | POA: Diagnosis not present

## 2021-02-26 DIAGNOSIS — I4811 Longstanding persistent atrial fibrillation: Secondary | ICD-10-CM | POA: Diagnosis present

## 2021-02-26 DIAGNOSIS — R069 Unspecified abnormalities of breathing: Secondary | ICD-10-CM | POA: Diagnosis not present

## 2021-02-26 DIAGNOSIS — I110 Hypertensive heart disease with heart failure: Secondary | ICD-10-CM | POA: Diagnosis present

## 2021-02-26 DIAGNOSIS — Z96612 Presence of left artificial shoulder joint: Secondary | ICD-10-CM | POA: Diagnosis present

## 2021-02-26 DIAGNOSIS — I2510 Atherosclerotic heart disease of native coronary artery without angina pectoris: Secondary | ICD-10-CM | POA: Diagnosis present

## 2021-02-26 DIAGNOSIS — J9601 Acute respiratory failure with hypoxia: Secondary | ICD-10-CM | POA: Diagnosis not present

## 2021-02-26 DIAGNOSIS — J189 Pneumonia, unspecified organism: Secondary | ICD-10-CM | POA: Diagnosis not present

## 2021-02-26 DIAGNOSIS — D509 Iron deficiency anemia, unspecified: Secondary | ICD-10-CM | POA: Diagnosis present

## 2021-02-26 DIAGNOSIS — Z7982 Long term (current) use of aspirin: Secondary | ICD-10-CM | POA: Diagnosis not present

## 2021-02-26 DIAGNOSIS — Z20822 Contact with and (suspected) exposure to covid-19: Secondary | ICD-10-CM | POA: Diagnosis not present

## 2021-02-26 DIAGNOSIS — Z9114 Patient's other noncompliance with medication regimen: Secondary | ICD-10-CM | POA: Diagnosis not present

## 2021-02-26 DIAGNOSIS — J9621 Acute and chronic respiratory failure with hypoxia: Secondary | ICD-10-CM | POA: Diagnosis present

## 2021-02-26 DIAGNOSIS — M069 Rheumatoid arthritis, unspecified: Secondary | ICD-10-CM | POA: Diagnosis present

## 2021-02-26 DIAGNOSIS — I10 Essential (primary) hypertension: Secondary | ICD-10-CM | POA: Diagnosis not present

## 2021-02-26 DIAGNOSIS — Z87891 Personal history of nicotine dependence: Secondary | ICD-10-CM | POA: Diagnosis not present

## 2021-02-26 DIAGNOSIS — I4821 Permanent atrial fibrillation: Secondary | ICD-10-CM | POA: Diagnosis not present

## 2021-02-26 DIAGNOSIS — R918 Other nonspecific abnormal finding of lung field: Secondary | ICD-10-CM | POA: Diagnosis not present

## 2021-02-26 DIAGNOSIS — A419 Sepsis, unspecified organism: Secondary | ICD-10-CM | POA: Diagnosis not present

## 2021-02-26 DIAGNOSIS — I482 Chronic atrial fibrillation, unspecified: Secondary | ICD-10-CM | POA: Diagnosis not present

## 2021-02-26 DIAGNOSIS — I509 Heart failure, unspecified: Secondary | ICD-10-CM | POA: Diagnosis not present

## 2021-02-26 DIAGNOSIS — R590 Localized enlarged lymph nodes: Secondary | ICD-10-CM | POA: Diagnosis not present

## 2021-02-26 DIAGNOSIS — K219 Gastro-esophageal reflux disease without esophagitis: Secondary | ICD-10-CM | POA: Diagnosis present

## 2021-02-26 DIAGNOSIS — Z950 Presence of cardiac pacemaker: Secondary | ICD-10-CM | POA: Diagnosis not present

## 2021-02-26 DIAGNOSIS — E785 Hyperlipidemia, unspecified: Secondary | ICD-10-CM | POA: Diagnosis present

## 2021-02-26 DIAGNOSIS — I5033 Acute on chronic diastolic (congestive) heart failure: Secondary | ICD-10-CM | POA: Diagnosis present

## 2021-02-26 DIAGNOSIS — Z7984 Long term (current) use of oral hypoglycemic drugs: Secondary | ICD-10-CM | POA: Diagnosis not present

## 2021-02-26 DIAGNOSIS — R0602 Shortness of breath: Secondary | ICD-10-CM | POA: Diagnosis not present

## 2021-02-26 DIAGNOSIS — Z8674 Personal history of sudden cardiac arrest: Secondary | ICD-10-CM | POA: Diagnosis not present

## 2021-02-26 DIAGNOSIS — Z95818 Presence of other cardiac implants and grafts: Secondary | ICD-10-CM | POA: Diagnosis not present

## 2021-02-26 DIAGNOSIS — I480 Paroxysmal atrial fibrillation: Secondary | ICD-10-CM | POA: Diagnosis not present

## 2021-02-26 DIAGNOSIS — J849 Interstitial pulmonary disease, unspecified: Secondary | ICD-10-CM | POA: Diagnosis present

## 2021-02-26 DIAGNOSIS — R0689 Other abnormalities of breathing: Secondary | ICD-10-CM | POA: Diagnosis not present

## 2021-02-27 ENCOUNTER — Encounter: Payer: Medicare Other | Admitting: Family Medicine

## 2021-02-28 NOTE — Progress Notes (Signed)
Patient unable to complete visit as he is current in the hospital.

## 2021-03-01 ENCOUNTER — Other Ambulatory Visit: Payer: Self-pay | Admitting: Family Medicine

## 2021-03-01 DIAGNOSIS — G4700 Insomnia, unspecified: Secondary | ICD-10-CM

## 2021-03-01 DIAGNOSIS — I10 Essential (primary) hypertension: Secondary | ICD-10-CM

## 2021-03-02 ENCOUNTER — Telehealth: Payer: Self-pay

## 2021-03-02 NOTE — Telephone Encounter (Signed)
Transition Care Management Follow-up Telephone Call Date of discharge and from where: [facility] 03/01/21 Diagnosis:  CHF, Sepsis How have you been since you were released from the hospital? Much better,, just very tired Any questions or concerns? No  Items Reviewed: Did the pt receive and understand the discharge instructions provided? Yes  Medications obtained and verified? Yes  Other? No  Any new allergies since your discharge? No  Dietary orders reviewed? Yes Do you have support at home? Yes   Home Care and Equipment/Supplies: Were home health services ordered? yes If so, what is the name of the agency? [facility]  Has the agency set up a time to come to the patient's home? yes Were any new equipment or medical supplies ordered?  No using Oxygen PRN he already had What is the name of the medical supply agency? [facility] Were you able to get the supplies/equipment? yes Do you have any questions related to the use of the equipment or supplies? No  Functional Questionnaire: (I = Independent and D = Dependent) ADLs: I  Bathing/Dressing- I  Meal Prep- I  Eating- I  Maintaining continence- I  Transferring/Ambulation- I  Managing Meds- I  Follow up appointments reviewed:  PCP Hospital f/u appt confirmed? Yes  Scheduled to see [name] on 03/13/21 @ 11:05. *[facility] No   Are transportation arrangements needed? No  If their condition worsens, is the pt aware to call PCP or go to the Emergency Dept.? Yes Was the patient provided with contact information for the PCP's office or ED? Yes Was to pt encouraged to call back with questions or concerns? Yes

## 2021-03-03 DIAGNOSIS — I2510 Atherosclerotic heart disease of native coronary artery without angina pectoris: Secondary | ICD-10-CM | POA: Diagnosis not present

## 2021-03-03 DIAGNOSIS — M069 Rheumatoid arthritis, unspecified: Secondary | ICD-10-CM | POA: Diagnosis not present

## 2021-03-03 DIAGNOSIS — I5032 Chronic diastolic (congestive) heart failure: Secondary | ICD-10-CM | POA: Diagnosis not present

## 2021-03-03 DIAGNOSIS — J189 Pneumonia, unspecified organism: Secondary | ICD-10-CM | POA: Diagnosis not present

## 2021-03-03 DIAGNOSIS — I110 Hypertensive heart disease with heart failure: Secondary | ICD-10-CM | POA: Diagnosis not present

## 2021-03-03 DIAGNOSIS — I480 Paroxysmal atrial fibrillation: Secondary | ICD-10-CM | POA: Diagnosis not present

## 2021-03-05 DIAGNOSIS — I110 Hypertensive heart disease with heart failure: Secondary | ICD-10-CM | POA: Diagnosis not present

## 2021-03-05 DIAGNOSIS — J189 Pneumonia, unspecified organism: Secondary | ICD-10-CM | POA: Diagnosis not present

## 2021-03-05 DIAGNOSIS — M069 Rheumatoid arthritis, unspecified: Secondary | ICD-10-CM | POA: Diagnosis not present

## 2021-03-05 DIAGNOSIS — I480 Paroxysmal atrial fibrillation: Secondary | ICD-10-CM | POA: Diagnosis not present

## 2021-03-05 DIAGNOSIS — I2510 Atherosclerotic heart disease of native coronary artery without angina pectoris: Secondary | ICD-10-CM | POA: Diagnosis not present

## 2021-03-05 DIAGNOSIS — I5032 Chronic diastolic (congestive) heart failure: Secondary | ICD-10-CM | POA: Diagnosis not present

## 2021-03-06 ENCOUNTER — Telehealth: Payer: Self-pay | Admitting: Family Medicine

## 2021-03-06 DIAGNOSIS — I2510 Atherosclerotic heart disease of native coronary artery without angina pectoris: Secondary | ICD-10-CM | POA: Diagnosis not present

## 2021-03-06 DIAGNOSIS — I5032 Chronic diastolic (congestive) heart failure: Secondary | ICD-10-CM | POA: Diagnosis not present

## 2021-03-06 DIAGNOSIS — J189 Pneumonia, unspecified organism: Secondary | ICD-10-CM | POA: Diagnosis not present

## 2021-03-06 DIAGNOSIS — I480 Paroxysmal atrial fibrillation: Secondary | ICD-10-CM | POA: Diagnosis not present

## 2021-03-06 DIAGNOSIS — M069 Rheumatoid arthritis, unspecified: Secondary | ICD-10-CM | POA: Diagnosis not present

## 2021-03-06 DIAGNOSIS — I110 Hypertensive heart disease with heart failure: Secondary | ICD-10-CM | POA: Diagnosis not present

## 2021-03-06 NOTE — Telephone Encounter (Signed)
FYI

## 2021-03-06 NOTE — Telephone Encounter (Signed)
He needs to wear his oxygen when up moving around.

## 2021-03-07 NOTE — Telephone Encounter (Signed)
Attempted to contact patient - NA  Attempted to return call of [facility]

## 2021-03-08 DIAGNOSIS — J189 Pneumonia, unspecified organism: Secondary | ICD-10-CM | POA: Diagnosis not present

## 2021-03-08 DIAGNOSIS — I480 Paroxysmal atrial fibrillation: Secondary | ICD-10-CM | POA: Diagnosis not present

## 2021-03-08 DIAGNOSIS — M069 Rheumatoid arthritis, unspecified: Secondary | ICD-10-CM | POA: Diagnosis not present

## 2021-03-08 DIAGNOSIS — I110 Hypertensive heart disease with heart failure: Secondary | ICD-10-CM | POA: Diagnosis not present

## 2021-03-08 DIAGNOSIS — I5032 Chronic diastolic (congestive) heart failure: Secondary | ICD-10-CM | POA: Diagnosis not present

## 2021-03-08 DIAGNOSIS — I2510 Atherosclerotic heart disease of native coronary artery without angina pectoris: Secondary | ICD-10-CM | POA: Diagnosis not present

## 2021-03-09 DIAGNOSIS — I110 Hypertensive heart disease with heart failure: Secondary | ICD-10-CM | POA: Diagnosis not present

## 2021-03-09 DIAGNOSIS — M069 Rheumatoid arthritis, unspecified: Secondary | ICD-10-CM | POA: Diagnosis not present

## 2021-03-09 DIAGNOSIS — J189 Pneumonia, unspecified organism: Secondary | ICD-10-CM | POA: Diagnosis not present

## 2021-03-09 DIAGNOSIS — I5032 Chronic diastolic (congestive) heart failure: Secondary | ICD-10-CM | POA: Diagnosis not present

## 2021-03-09 DIAGNOSIS — I2510 Atherosclerotic heart disease of native coronary artery without angina pectoris: Secondary | ICD-10-CM | POA: Diagnosis not present

## 2021-03-09 DIAGNOSIS — I480 Paroxysmal atrial fibrillation: Secondary | ICD-10-CM | POA: Diagnosis not present

## 2021-03-13 ENCOUNTER — Telehealth (INDEPENDENT_AMBULATORY_CARE_PROVIDER_SITE_OTHER): Payer: Medicare Other | Admitting: Family Medicine

## 2021-03-13 ENCOUNTER — Encounter: Payer: Self-pay | Admitting: Family Medicine

## 2021-03-13 DIAGNOSIS — I110 Hypertensive heart disease with heart failure: Secondary | ICD-10-CM | POA: Diagnosis not present

## 2021-03-13 DIAGNOSIS — I5032 Chronic diastolic (congestive) heart failure: Secondary | ICD-10-CM

## 2021-03-13 DIAGNOSIS — I2510 Atherosclerotic heart disease of native coronary artery without angina pectoris: Secondary | ICD-10-CM | POA: Diagnosis not present

## 2021-03-13 DIAGNOSIS — M069 Rheumatoid arthritis, unspecified: Secondary | ICD-10-CM | POA: Diagnosis not present

## 2021-03-13 DIAGNOSIS — J189 Pneumonia, unspecified organism: Secondary | ICD-10-CM | POA: Diagnosis not present

## 2021-03-13 DIAGNOSIS — I480 Paroxysmal atrial fibrillation: Secondary | ICD-10-CM | POA: Diagnosis not present

## 2021-03-13 NOTE — Progress Notes (Signed)
Virtual Visit via Video note  I connected with Adam Watts on 03/13/21 at 11:05 AM by video and verified that I am speaking with the correct person using two identifiers. [place] and [place] wife is currently with him during visit. The provider, Loman Brooklyn, FNP is located in their office at time of visit.  I discussed the limitations, risks, security and privacy concerns of performing an evaluation and management service by video and the availability of in person appointments. I also discussed with the patient that there may be a patient responsible charge related to this service. The patient expressed understanding and agreed to proceed.  Subjective: PCP: Loman Brooklyn, FNP  Chief Complaint  Patient presents with   Hospitalization Follow-up    Acute on Chronic CHF   Patient here for a hospital follow-up. He was admitted to [facility] 02/26/2021-03/01/2021 due to acute on chronic CHF and pneumonia. He was discharged with the addition of digoxin (due to A-Fib) and an increase in furosemide (due to CHF). He did receive a blood transfusion in the hospital due to anemia.   Patient to follow-up with cardiology due to acute on chronic CHF; appointment scheduled for 03/19/2021. He has an appointment with pulmonology on 03/22/2021. He was discharged with home health physical therapy who have been coming out. Palliative care was ordered. They changed his Norco to Morphine sulfate 15 mg QHS which he reports has really been helping. He rates his pain 3-4/10 on average and states he is sleeping much better.   Patient is sticking to his heart healthy diet and weighing himself regularly. His blood pressure is staying around 110/60. His oxygen saturation does drop to 87-88% with activity but he states it comes back up over 90% within 30 seconds of resting.   ROS: Per HPI  Current Outpatient Medications:    albuterol (VENTOLIN HFA) 108 (90 Base) MCG/ACT  inhaler, Inhale 2 puffs into the lungs every 6 (six) hours as needed., Disp: 18 g, Rfl: 2   aspirin 81 MG EC tablet, Take 1 tablet by mouth daily., Disp: , Rfl:    CINNAMON PO, Take 2,000 mg by mouth daily. , Disp: , Rfl:    clopidogrel (PLAVIX) 75 MG tablet, Take 75 mg by mouth daily., Disp: , Rfl:    digoxin (LANOXIN) 0.125 MG tablet, Take by mouth., Disp: , Rfl:    diltiazem (TIAZAC) 360 MG 24 hr capsule, Take 1 capsule (360 mg total) by mouth daily., Disp: 90 capsule, Rfl: 1   Ferrous Sulfate (IRON PO), Take by mouth., Disp: , Rfl:    fexofenadine (ALLEGRA) 60 MG tablet, Take 60 mg by mouth daily., Disp: , Rfl:    FLUoxetine (PROZAC) 20 MG capsule, Take 1 capsule (20 mg total) by mouth daily., Disp: 30 capsule, Rfl: 2   folic acid (FOLVITE) 1 MG tablet, Take 2 mg by mouth daily., Disp: , Rfl:    furosemide (LASIX) 40 MG tablet, Take 40 mg by mouth daily., Disp: , Rfl:    gabapentin (NEURONTIN) 100 MG capsule, Take by mouth., Disp: , Rfl:    guaifenesin (HUMIBID E) 400 MG TABS tablet, Take by mouth., Disp: , Rfl:    hydrochlorothiazide (MICROZIDE) 12.5 MG capsule, TAKE 1 CAPSULE BY MOUTH DAILY, Disp: 90 capsule, Rfl: 0   HYDROcodone-acetaminophen (NORCO) 10-325 MG tablet, Take by mouth., Disp: , Rfl:    ipratropium-albuterol (DUONEB) 0.5-2.5 (3) MG/3ML SOLN, Take 3 mLs by nebulization every  4 (four) hours as needed., Disp: 360 mL, Rfl: 2   leflunomide (ARAVA) 20 MG tablet, Take 20 mg by mouth daily., Disp: , Rfl:    losartan (COZAAR) 100 MG tablet, Take 1 tablet (100 mg total) by mouth daily., Disp: 90 tablet, Rfl: 1   losartan (COZAAR) 25 MG tablet, Take by mouth., Disp: , Rfl:    magnesium oxide (MAG-OX) 400 MG tablet, Take by mouth., Disp: , Rfl:    metFORMIN (GLUCOPHAGE-XR) 500 MG 24 hr tablet, Take 1 tablet (500 mg total) by mouth 3 (three) times daily with meals., Disp: 270 tablet, Rfl: 1   Methotrexate 25 MG/ML SOSY, Inject into the skin., Disp: , Rfl:    methotrexate 50 MG/2ML  injection, Inject 20 mg into the skin once a week., Disp: , Rfl:    metoprolol succinate (TOPROL-XL) 25 MG 24 hr tablet, Take 1 tablet by mouth daily., Disp: , Rfl:    Multiple Vitamins-Minerals (MEGA MULTIVITAMIN FOR MEN PO), Take 1 tablet by mouth daily. , Disp: , Rfl:    nebivolol (BYSTOLIC) 5 MG tablet, Take 1 tablet by mouth daily., Disp: , Rfl:    pantoprazole (PROTONIX) 40 MG tablet, Take 1 tablet by mouth daily., Disp: , Rfl:    potassium chloride (MICRO-K) 10 MEQ CR capsule, Take 10 mEq by mouth daily., Disp: , Rfl:    predniSONE (DELTASONE) 10 MG tablet, Take 4 tablets daily at bedtime for 2 days, then 3 tablets daily for 2 days, then 2 tablets daily for 2 days, then 1 tab daily for 2 days, then 1/2 tablet daily for 2 days, then stop, Disp: , Rfl:    pregabalin (LYRICA) 50 MG capsule, Take 50 mg by mouth 2 (two) times daily., Disp: , Rfl:    Suvorexant (BELSOMRA) 10 MG TABS, Take 10 mg by mouth at bedtime as needed., Disp: 30 tablet, Rfl: 2   tamsulosin (FLOMAX) 0.4 MG CAPS capsule, Take 0.4 mg by mouth daily., Disp: , Rfl:    temazepam (RESTORIL) 15 MG capsule, Take 1 capsule (15 mg total) by mouth at bedtime., Disp: 30 capsule, Rfl: 0   traZODone (DESYREL) 50 MG tablet, Take by mouth., Disp: , Rfl:  No current facility-administered medications for this visit.  Facility-Administered Medications Ordered in Other Visits:    ondansetron (ZOFRAN) 4 mg in sodium chloride 0.9 % 50 mL IVPB, 4 mg, Intravenous, Q6H PRN, Ashok Pall, MD  Allergies  Allergen Reactions   Metoprolol Shortness Of Breath    Patient reports SOB while taking   Abatacept Other (See Comments)   Infliximab Other (See Comments)   Methotrexate Other (See Comments)   Other Other (See Comments)   Statins Other (See Comments)    Pain   Past Medical History:  Diagnosis Date   A-fib ([place])    Chest pain    Chronic back pain    Diabetic nephropathy ([place]) 09/17/2020   Dysrhythmia    a-fib   Fracture of lateral  malleolus 02/11/2015   Gastro-esophageal reflux disease with esophagitis    Hyperlipidemia, unspecified    Hypertension    Insomnia, unspecified    Lumbar radiculopathy    Nicotine dependence, unspecified, uncomplicated    Pacemaker    CHB, PAF   Pain in left shoulder    Pain in right leg    Pneumonia    Pre-diabetes    Presence of cardiac pacemaker    Rheumatoid arthritis, unspecified ([place])    Type 2 diabetes mellitus without complications ([place])  Unilateral inguinal hernia, without obstruction or gangrene, not specified as recurrent     Observations/Objective: Physical Exam Constitutional:      General: He is not in acute distress.    Appearance: Normal appearance. He is not ill-appearing or toxic-appearing.  Eyes:     General: No scleral icterus.       Right eye: No discharge.        Left eye: No discharge.     Conjunctiva/sclera: Conjunctivae normal.  Pulmonary:     Effort: Pulmonary effort is normal. No respiratory distress.  Neurological:     Mental Status: He is alert and oriented to person, place, and time.  Psychiatric:        Mood and Affect: Mood normal.        Behavior: Behavior normal.        Thought Content: Thought content normal.        Judgment: Judgment normal.   Assessment and Plan: 1. Chronic diastolic heart failure ([employer]) Patient is feeling much better. Continue current regimen, physical therapy, heart healthy diet, and daily weights. Keep follow-up appointment with cardiology. I did call palliative care # provided by the patient - they are going to fax over anything they need from me.     Follow Up Instructions:   I discussed the assessment and treatment plan with the patient. The patient was provided an opportunity to ask questions and all were answered. The patient agreed with the plan and demonstrated an understanding of the instructions.   The patient was advised to call back or seek an in-person evaluation if the symptoms worsen or if the  condition fails to improve as anticipated.  The above assessment and management plan was discussed with the patient. The patient verbalized understanding of and has agreed to the management plan. Patient is aware to call the clinic if symptoms persist or worsen. Patient is aware when to return to the clinic for a follow-up visit. Patient educated on when it is appropriate to go to the emergency department.   Time call ended: 11:16 AM  I provided 11 minutes of face-to-face time during this encounter.   [name], MSN, APRN, FNP-C [facility] 03/13/21

## 2021-03-15 DIAGNOSIS — J189 Pneumonia, unspecified organism: Secondary | ICD-10-CM | POA: Diagnosis not present

## 2021-03-15 DIAGNOSIS — I5032 Chronic diastolic (congestive) heart failure: Secondary | ICD-10-CM | POA: Diagnosis not present

## 2021-03-15 DIAGNOSIS — I2510 Atherosclerotic heart disease of native coronary artery without angina pectoris: Secondary | ICD-10-CM | POA: Diagnosis not present

## 2021-03-15 DIAGNOSIS — I110 Hypertensive heart disease with heart failure: Secondary | ICD-10-CM | POA: Diagnosis not present

## 2021-03-15 DIAGNOSIS — I480 Paroxysmal atrial fibrillation: Secondary | ICD-10-CM | POA: Diagnosis not present

## 2021-03-15 DIAGNOSIS — M069 Rheumatoid arthritis, unspecified: Secondary | ICD-10-CM | POA: Diagnosis not present

## 2021-03-16 DIAGNOSIS — I110 Hypertensive heart disease with heart failure: Secondary | ICD-10-CM | POA: Diagnosis not present

## 2021-03-16 DIAGNOSIS — J189 Pneumonia, unspecified organism: Secondary | ICD-10-CM | POA: Diagnosis not present

## 2021-03-16 DIAGNOSIS — I480 Paroxysmal atrial fibrillation: Secondary | ICD-10-CM | POA: Diagnosis not present

## 2021-03-16 DIAGNOSIS — I2510 Atherosclerotic heart disease of native coronary artery without angina pectoris: Secondary | ICD-10-CM | POA: Diagnosis not present

## 2021-03-16 DIAGNOSIS — M069 Rheumatoid arthritis, unspecified: Secondary | ICD-10-CM | POA: Diagnosis not present

## 2021-03-16 DIAGNOSIS — I5032 Chronic diastolic (congestive) heart failure: Secondary | ICD-10-CM | POA: Diagnosis not present

## 2021-03-19 DIAGNOSIS — Z9981 Dependence on supplemental oxygen: Secondary | ICD-10-CM | POA: Diagnosis not present

## 2021-03-19 DIAGNOSIS — J9601 Acute respiratory failure with hypoxia: Secondary | ICD-10-CM | POA: Diagnosis not present

## 2021-03-19 DIAGNOSIS — M069 Rheumatoid arthritis, unspecified: Secondary | ICD-10-CM | POA: Diagnosis not present

## 2021-03-19 DIAGNOSIS — Z950 Presence of cardiac pacemaker: Secondary | ICD-10-CM | POA: Diagnosis not present

## 2021-03-19 DIAGNOSIS — I480 Paroxysmal atrial fibrillation: Secondary | ICD-10-CM | POA: Diagnosis not present

## 2021-03-19 DIAGNOSIS — E785 Hyperlipidemia, unspecified: Secondary | ICD-10-CM | POA: Diagnosis not present

## 2021-03-19 DIAGNOSIS — R079 Chest pain, unspecified: Secondary | ICD-10-CM | POA: Diagnosis not present

## 2021-03-19 DIAGNOSIS — E1159 Type 2 diabetes mellitus with other circulatory complications: Secondary | ICD-10-CM | POA: Diagnosis not present

## 2021-03-19 DIAGNOSIS — I110 Hypertensive heart disease with heart failure: Secondary | ICD-10-CM | POA: Diagnosis not present

## 2021-03-19 DIAGNOSIS — J849 Interstitial pulmonary disease, unspecified: Secondary | ICD-10-CM | POA: Diagnosis not present

## 2021-03-19 DIAGNOSIS — I4821 Permanent atrial fibrillation: Secondary | ICD-10-CM | POA: Diagnosis not present

## 2021-03-19 DIAGNOSIS — I5033 Acute on chronic diastolic (congestive) heart failure: Secondary | ICD-10-CM | POA: Diagnosis not present

## 2021-03-19 DIAGNOSIS — J158 Pneumonia due to other specified bacteria: Secondary | ICD-10-CM | POA: Diagnosis not present

## 2021-03-19 DIAGNOSIS — I4819 Other persistent atrial fibrillation: Secondary | ICD-10-CM | POA: Diagnosis not present

## 2021-03-19 DIAGNOSIS — I152 Hypertension secondary to endocrine disorders: Secondary | ICD-10-CM | POA: Diagnosis not present

## 2021-03-19 DIAGNOSIS — I10 Essential (primary) hypertension: Secondary | ICD-10-CM | POA: Diagnosis not present

## 2021-03-19 DIAGNOSIS — E1169 Type 2 diabetes mellitus with other specified complication: Secondary | ICD-10-CM | POA: Diagnosis not present

## 2021-03-19 DIAGNOSIS — I2510 Atherosclerotic heart disease of native coronary artery without angina pectoris: Secondary | ICD-10-CM | POA: Diagnosis not present

## 2021-03-19 DIAGNOSIS — I5032 Chronic diastolic (congestive) heart failure: Secondary | ICD-10-CM | POA: Diagnosis not present

## 2021-03-20 DIAGNOSIS — I110 Hypertensive heart disease with heart failure: Secondary | ICD-10-CM | POA: Diagnosis not present

## 2021-03-20 DIAGNOSIS — I2510 Atherosclerotic heart disease of native coronary artery without angina pectoris: Secondary | ICD-10-CM | POA: Diagnosis not present

## 2021-03-20 DIAGNOSIS — J189 Pneumonia, unspecified organism: Secondary | ICD-10-CM | POA: Diagnosis not present

## 2021-03-20 DIAGNOSIS — M069 Rheumatoid arthritis, unspecified: Secondary | ICD-10-CM | POA: Diagnosis not present

## 2021-03-20 DIAGNOSIS — I5032 Chronic diastolic (congestive) heart failure: Secondary | ICD-10-CM | POA: Diagnosis not present

## 2021-03-20 DIAGNOSIS — I480 Paroxysmal atrial fibrillation: Secondary | ICD-10-CM | POA: Diagnosis not present

## 2021-03-26 ENCOUNTER — Ambulatory Visit (INDEPENDENT_AMBULATORY_CARE_PROVIDER_SITE_OTHER): Payer: Medicare Other

## 2021-03-26 DIAGNOSIS — I442 Atrioventricular block, complete: Secondary | ICD-10-CM | POA: Diagnosis not present

## 2021-03-28 LAB — CUP PACEART REMOTE DEVICE CHECK
Battery Impedance: 1244 Ohm
Battery Remaining Longevity: 52 mo
Battery Voltage: 2.78 V
Brady Statistic AP VP Percent: 5 %
Brady Statistic AP VS Percent: 0 %
Brady Statistic AS VP Percent: 37 %
Brady Statistic AS VS Percent: 58 %
Date Time Interrogation Session: 20220830115554
Implantable Lead Implant Date: 20140421
Implantable Lead Implant Date: 20140421
Implantable Lead Location: 753859
Implantable Lead Location: 753860
Implantable Lead Model: 5076
Implantable Lead Model: 5592
Implantable Pulse Generator Implant Date: 20140421
Lead Channel Impedance Value: 459 Ohm
Lead Channel Impedance Value: 464 Ohm
Lead Channel Pacing Threshold Amplitude: 0.5 V
Lead Channel Pacing Threshold Amplitude: 1.125 V
Lead Channel Pacing Threshold Pulse Width: 0.4 ms
Lead Channel Pacing Threshold Pulse Width: 0.4 ms
Lead Channel Setting Pacing Amplitude: 2 V
Lead Channel Setting Pacing Amplitude: 2.25 V
Lead Channel Setting Pacing Pulse Width: 0.4 ms
Lead Channel Setting Sensing Sensitivity: 2.8 mV

## 2021-03-31 ENCOUNTER — Other Ambulatory Visit: Payer: Self-pay | Admitting: Family Medicine

## 2021-03-31 DIAGNOSIS — E119 Type 2 diabetes mellitus without complications: Secondary | ICD-10-CM

## 2021-04-09 NOTE — Progress Notes (Signed)
Remote pacemaker transmission.   

## 2021-04-16 DIAGNOSIS — I110 Hypertensive heart disease with heart failure: Secondary | ICD-10-CM | POA: Diagnosis not present

## 2021-04-16 DIAGNOSIS — R079 Chest pain, unspecified: Secondary | ICD-10-CM | POA: Diagnosis not present

## 2021-04-16 DIAGNOSIS — M069 Rheumatoid arthritis, unspecified: Secondary | ICD-10-CM | POA: Diagnosis not present

## 2021-04-16 DIAGNOSIS — J158 Pneumonia due to other specified bacteria: Secondary | ICD-10-CM | POA: Diagnosis not present

## 2021-04-16 DIAGNOSIS — I480 Paroxysmal atrial fibrillation: Secondary | ICD-10-CM | POA: Diagnosis not present

## 2021-04-16 DIAGNOSIS — I5033 Acute on chronic diastolic (congestive) heart failure: Secondary | ICD-10-CM | POA: Diagnosis not present

## 2021-04-16 DIAGNOSIS — J9601 Acute respiratory failure with hypoxia: Secondary | ICD-10-CM | POA: Diagnosis not present

## 2021-04-16 DIAGNOSIS — Z9981 Dependence on supplemental oxygen: Secondary | ICD-10-CM | POA: Diagnosis not present

## 2021-04-16 DIAGNOSIS — I10 Essential (primary) hypertension: Secondary | ICD-10-CM | POA: Diagnosis not present

## 2021-04-16 DIAGNOSIS — Z950 Presence of cardiac pacemaker: Secondary | ICD-10-CM | POA: Diagnosis not present

## 2021-04-30 ENCOUNTER — Other Ambulatory Visit: Payer: Self-pay | Admitting: Family Medicine

## 2021-04-30 DIAGNOSIS — F332 Major depressive disorder, recurrent severe without psychotic features: Secondary | ICD-10-CM

## 2021-04-30 DIAGNOSIS — F5101 Primary insomnia: Secondary | ICD-10-CM

## 2021-05-01 ENCOUNTER — Other Ambulatory Visit: Payer: Self-pay | Admitting: Family Medicine

## 2021-05-09 DIAGNOSIS — Z8719 Personal history of other diseases of the digestive system: Secondary | ICD-10-CM | POA: Diagnosis not present

## 2021-05-09 DIAGNOSIS — E1159 Type 2 diabetes mellitus with other circulatory complications: Secondary | ICD-10-CM | POA: Diagnosis not present

## 2021-05-09 DIAGNOSIS — Z950 Presence of cardiac pacemaker: Secondary | ICD-10-CM | POA: Diagnosis not present

## 2021-05-09 DIAGNOSIS — I4819 Other persistent atrial fibrillation: Secondary | ICD-10-CM | POA: Diagnosis not present

## 2021-05-09 DIAGNOSIS — Z95818 Presence of other cardiac implants and grafts: Secondary | ICD-10-CM | POA: Diagnosis not present

## 2021-05-09 DIAGNOSIS — I2510 Atherosclerotic heart disease of native coronary artery without angina pectoris: Secondary | ICD-10-CM | POA: Diagnosis not present

## 2021-05-09 DIAGNOSIS — I152 Hypertension secondary to endocrine disorders: Secondary | ICD-10-CM | POA: Diagnosis not present

## 2021-05-09 DIAGNOSIS — K279 Peptic ulcer, site unspecified, unspecified as acute or chronic, without hemorrhage or perforation: Secondary | ICD-10-CM | POA: Diagnosis not present

## 2021-05-09 DIAGNOSIS — I5032 Chronic diastolic (congestive) heart failure: Secondary | ICD-10-CM | POA: Diagnosis not present

## 2021-05-09 DIAGNOSIS — E1169 Type 2 diabetes mellitus with other specified complication: Secondary | ICD-10-CM | POA: Diagnosis not present

## 2021-05-11 DIAGNOSIS — Z20828 Contact with and (suspected) exposure to other viral communicable diseases: Secondary | ICD-10-CM | POA: Diagnosis not present

## 2021-05-14 DIAGNOSIS — I10 Essential (primary) hypertension: Secondary | ICD-10-CM | POA: Diagnosis not present

## 2021-05-14 DIAGNOSIS — R079 Chest pain, unspecified: Secondary | ICD-10-CM | POA: Diagnosis not present

## 2021-05-14 DIAGNOSIS — Z950 Presence of cardiac pacemaker: Secondary | ICD-10-CM | POA: Diagnosis not present

## 2021-05-14 DIAGNOSIS — Z9981 Dependence on supplemental oxygen: Secondary | ICD-10-CM | POA: Diagnosis not present

## 2021-05-14 DIAGNOSIS — J9601 Acute respiratory failure with hypoxia: Secondary | ICD-10-CM | POA: Diagnosis not present

## 2021-05-14 DIAGNOSIS — I5033 Acute on chronic diastolic (congestive) heart failure: Secondary | ICD-10-CM | POA: Diagnosis not present

## 2021-05-14 DIAGNOSIS — I110 Hypertensive heart disease with heart failure: Secondary | ICD-10-CM | POA: Diagnosis not present

## 2021-05-14 DIAGNOSIS — M069 Rheumatoid arthritis, unspecified: Secondary | ICD-10-CM | POA: Diagnosis not present

## 2021-05-14 DIAGNOSIS — J158 Pneumonia due to other specified bacteria: Secondary | ICD-10-CM | POA: Diagnosis not present

## 2021-05-14 DIAGNOSIS — I480 Paroxysmal atrial fibrillation: Secondary | ICD-10-CM | POA: Diagnosis not present

## 2021-05-30 ENCOUNTER — Telehealth: Payer: Self-pay | Admitting: Family Medicine

## 2021-05-30 ENCOUNTER — Encounter: Payer: Self-pay | Admitting: Family Medicine

## 2021-05-30 ENCOUNTER — Other Ambulatory Visit: Payer: Self-pay

## 2021-05-30 ENCOUNTER — Ambulatory Visit (INDEPENDENT_AMBULATORY_CARE_PROVIDER_SITE_OTHER): Payer: Medicare Other | Admitting: Family Medicine

## 2021-05-30 VITALS — BP 124/68 | HR 89 | Temp 97.8°F | Ht 66.0 in | Wt 156.4 lb

## 2021-05-30 DIAGNOSIS — Z23 Encounter for immunization: Secondary | ICD-10-CM | POA: Diagnosis not present

## 2021-05-30 DIAGNOSIS — I480 Paroxysmal atrial fibrillation: Secondary | ICD-10-CM

## 2021-05-30 DIAGNOSIS — J449 Chronic obstructive pulmonary disease, unspecified: Secondary | ICD-10-CM

## 2021-05-30 DIAGNOSIS — E1121 Type 2 diabetes mellitus with diabetic nephropathy: Secondary | ICD-10-CM

## 2021-05-30 DIAGNOSIS — M069 Rheumatoid arthritis, unspecified: Secondary | ICD-10-CM | POA: Insufficient documentation

## 2021-05-30 DIAGNOSIS — F332 Major depressive disorder, recurrent severe without psychotic features: Secondary | ICD-10-CM | POA: Diagnosis not present

## 2021-05-30 DIAGNOSIS — I10 Essential (primary) hypertension: Secondary | ICD-10-CM

## 2021-05-30 DIAGNOSIS — M0609 Rheumatoid arthritis without rheumatoid factor, multiple sites: Secondary | ICD-10-CM

## 2021-05-30 DIAGNOSIS — I5032 Chronic diastolic (congestive) heart failure: Secondary | ICD-10-CM | POA: Diagnosis not present

## 2021-05-30 DIAGNOSIS — D500 Iron deficiency anemia secondary to blood loss (chronic): Secondary | ICD-10-CM

## 2021-05-30 DIAGNOSIS — J849 Interstitial pulmonary disease, unspecified: Secondary | ICD-10-CM

## 2021-05-30 DIAGNOSIS — I25119 Atherosclerotic heart disease of native coronary artery with unspecified angina pectoris: Secondary | ICD-10-CM | POA: Diagnosis not present

## 2021-05-30 DIAGNOSIS — M25561 Pain in right knee: Secondary | ICD-10-CM

## 2021-05-30 DIAGNOSIS — E782 Mixed hyperlipidemia: Secondary | ICD-10-CM

## 2021-05-30 DIAGNOSIS — K2100 Gastro-esophageal reflux disease with esophagitis, without bleeding: Secondary | ICD-10-CM

## 2021-05-30 DIAGNOSIS — E1165 Type 2 diabetes mellitus with hyperglycemia: Secondary | ICD-10-CM

## 2021-05-30 DIAGNOSIS — Z9981 Dependence on supplemental oxygen: Secondary | ICD-10-CM

## 2021-05-30 DIAGNOSIS — M25511 Pain in right shoulder: Secondary | ICD-10-CM

## 2021-05-30 DIAGNOSIS — F5101 Primary insomnia: Secondary | ICD-10-CM

## 2021-05-30 DIAGNOSIS — G8929 Other chronic pain: Secondary | ICD-10-CM

## 2021-05-30 LAB — BAYER DCA HB A1C WAIVED: HB A1C (BAYER DCA - WAIVED): 6.6 % — ABNORMAL HIGH (ref 4.8–5.6)

## 2021-05-30 MED ORDER — METFORMIN HCL ER 500 MG PO TB24: 500.0000 mg | ORAL_TABLET | Freq: Three times a day (TID) | ORAL | 1 refills | Status: AC

## 2021-05-30 MED ORDER — DILTIAZEM HCL ER BEADS 360 MG PO CP24: 360.0000 mg | ORAL_CAPSULE | Freq: Every day | ORAL | 1 refills | Status: AC

## 2021-05-30 MED ORDER — TRAZODONE HCL 50 MG PO TABS: 50.0000 mg | ORAL_TABLET | Freq: Every day | ORAL | 1 refills | Status: AC

## 2021-05-30 NOTE — Telephone Encounter (Signed)
[facility] is who they currently have oxygen with

## 2021-05-30 NOTE — Progress Notes (Addendum)
Assessment & Plan:  1. Chronic diastolic heart failure ([place]) - managed by cardiology - continue diltiazem, losartan, and lasix as prescribed - Anemia Profile B - CMP14+EGFR - Lipid panel - diltiazem (TIAZAC) 360 MG 24 hr capsule; Take 1 capsule (360 mg total) by mouth daily.  Dispense: 90 capsule; Refill: 1  2. Essential hypertension - Well controlled on current regimen - continue losartan 12.5 mg and diltiazem 360 mg - Anemia Profile B - CMP14+EGFR - Lipid panel - diltiazem (TIAZAC) 360 MG 24 hr capsule; Take 1 capsule (360 mg total) by mouth daily.  Dispense: 90 capsule; Refill: 1  3. Paroxysmal atrial fibrillation (HCC) - Well controlled on current regimen. Presence of watchman device. - continue diltiazem - Anemia Profile B - CMP14+EGFR  4. Coronary artery disease involving native coronary artery of native heart with angina pectoris (HCC) - CMP14+EGFR - Lipid panel  5. Mixed hyperlipidemia - encouraged healthy diet and exercise - CMP14+EGFR - Lipid panel  6. COPD without exacerbation ([place]) - encouraged to follow up with pulmonology - patient will call to schedule - albuterol as needed for shortness of breath   7. ILD (interstitial lung disease) ([place]) - encouraged to follow up with pulmonology - patient will call to schedule - will order portable oxygen concentrator when patient or his wife get me the name of the company they get his oxygen through  8. Gastroesophageal reflux disease with esophagitis, unspecified whether hemorrhage - Well controlled on current regimen.  - continue protonix as prescribed - CMP14+EGFR  9. Type 2 diabetes mellitus with hyperglycemia, without long-term current use of insulin (HCC) Lab Results  Component Value Date   HGBA1C 6.6 (H) 05/30/2021   HGBA1C 7.2 (H) 11/27/2020   HGBA1C 8.4 (H) 09/15/2020    - Diabetes is at goal of A1c < 7. - Medications: continue current medications - Home glucose monitoring: continue monitoring -  Patient is not currently taking a statin. Patient is taking an ACE-inhibitor/ARB.   Diabetes Health Maintenance Due  Topic Date Due   OPHTHALMOLOGY EXAM  09/29/2021   FOOT EXAM  11/27/2021   HEMOGLOBIN A1C  11/27/2021    Lab Results  Component Value Date   LABMICR 59.4 09/15/2020   MICROALBUR 7.9 12/16/2014   - Anemia Profile B - CMP14+EGFR - Lipid panel - Bayer DCA Hb A1c Waived - continue healthy diet and exercise - continue metformin as prescribed - metFORMIN (GLUCOPHAGE-XR) 500 MG 24 hr tablet; Take 1 tablet (500 mg total) by mouth 3 (three) times daily with meals.  Dispense: 270 tablet; Refill: 1  10. Diabetic nephropathy associated with type 2 diabetes mellitus (HCC) - CMP today  11. Rheumatoid arthritis of multiple sites with negative rheumatoid factor ([place]) - encouraged to follow up with rheumatologist - appointment scheduled for January - continue medications as prescribed by rheumatologist - CMP14+EGFR  12. Iron deficiency anemia due to chronic blood loss - Anemia Profile B  13. Primary insomnia - d/c Belsomra and restart trazodone since patient is doing this already and it is working for him - CMP14+EGFR - traZODone (DESYREL) 50 MG tablet; Take 1 tablet (50 mg total) by mouth at bedtime.  Dispense: 90 tablet; Refill: 1  14. Severe episode of recurrent major depressive disorder, without psychotic features ([place]) - improving on Prozac, will continue dose for now - CMP14+EGFR  15. Acute pain of right knee - encouraged to continue to use pain medicine - patient to see orthopedic if fails to improve, declined offer for X-ray  and referral to PT  16. Chronic right shoulder pain - encouraged to see orthopedic  - declined offer for X-ray and referral to PT  17. Need for immunization against influenza - influenza vaccine given in office today    Addendum 05/31/2021: POC ordered to be sent to [place].  Return in about 3 months (around 08/30/2021) for follow-up  of chronic medication conditions (30 minute appointment).  Lucile Crater, NP Student  I personally was present during the history, physical exam, and medical decision-making activities of this service and have verified that the service and findings are accurately documented in the nurse practitioner student's note.  [name], MSN, APRN, FNP-C [facility]   Subjective:    Patient ID: Adam Watts, male    DOB: [birth year], 70 y.o.   MRN: [id number]  Patient Care Team: Loman Brooklyn, FNP as PCP - General (Family Medicine) Evans Lance, MD as PCP - Cardiology (Cardiology) Josue Hector, MD as Consulting Physician (Cardiology) Evans Lance, MD as Consulting Physician (Cardiology) Harlen Labs, MD as Referring Physician (Optometry)   Chief Complaint:  Chief Complaint  Patient presents with   Diabetes   Hypertension    Check up of chronic medical conditions    Joint Swelling    Right knee swelling x 1 1/2 weeks    Arthritis    HPI: Adam Watts is a 70 y.o. male presenting on 05/30/2021 for Diabetes, Hypertension (Check up of chronic medical conditions ), Joint Swelling (Right knee swelling x 1 1/2 weeks ), and Arthritis  He is seen by Cardiology at [facility], most recently 8/22, for CAD, CHF, and persistent atrial fibrillation. He has a Multimedia programmer. He was decreased on his lasix due to complaint of dizziness. He was advised to stop his flomax if his dizziness persisted after 4-5 days. His 100 mg losartan, metoprolol, and hydrochlorothiazide were discontinued. He was advised to increase his lasix to 40 mg BID for 24 hours if his weight increases by 3 lbs overnight or 5 lbs in a week. He is to stop plavix on 07/24/21 and will be on aspirin indefinitely. His next follow up is 06/25/21. He is taking diltiazem 360 mg and 12.5 mg losartan. He states his dizziness is better today.  He is followed by pulmonology for interstitial lung disease  due to his RA and emphysema. He last saw them in February this year. They recommended Spiriva but he declined at that time. He was advised to return in August for repeat PFTs but has not yet done so due to a scheduling conflict, but he will reschedule. He states he has never used his albuterol and uses his nebulizer once every 2 months. He states he has an oxygen concentrator at his home in [place] and uses a portable tank when out for long periods of time. He is requesting a Marine scientist.   He was taken off his leflunomide for his RA. He was stopped due to diverticulitis and CHF. He is still taking his methotrexate. He has an appointment on 08/16/21 but is on the list to be seen sooner if an appointment becomes available.   He was placed on palliative care in 8/22 due to recurrent hospitalizations as a result of CHF and RA and management of chronic pain.   Diabetes Patient presents for follow up of diabetes. Current symptoms include: hyperglycemia. Known diabetic complications: nephropathy, peripheral neuropathy, and cardiovascular disease. Medication compliance: metformin TID. Current diet: in general,  a "healthy" diet  , well balanced. Current exercise: none. Home blood sugar records: BGs range between 115 and 140, taken randomly . Is he  on ACE inhibitor or angiotensin II receptor blocker? Yes (Losartan). Is he on a statin? No, intolerant.   Depression He was started on Prozac at his last visit. He has tried Paxil and Trazodone in the past. He states he feels like this is working for him. He is not interested in increasing the dose at this time.   Depression screen [facility] 2/9 05/30/2021 02/14/2021 12/20/2020  Decreased Interest 2 2 1   Down, Depressed, Hopeless 2 3 2   PHQ - 2 Score 4 5 3   Altered sleeping 0 2 3  Tired, decreased energy 2 0 2  Change in appetite 0 0 2  Feeling bad or failure about yourself  2 3 2   Trouble concentrating 2 0 3  Moving slowly or fidgety/restless 1 2 0   Suicidal thoughts 1 2 1   PHQ-9 Score 12 14 16   Difficult doing work/chores Very difficult Very difficult Very difficult   GAD 7 : Generalized Anxiety Score 05/30/2021 02/14/2021 11/27/2020  Nervous, Anxious, on Edge 2 3 2   Control/stop worrying 2 3 3   Worry too much - different things 2 3 2   Trouble relaxing 2 3 3   Restless 2 3 2   Easily annoyed or irritable 2 3 3   Afraid - awful might happen 2 3 1   Total GAD 7 Score 14 21 16   Anxiety Difficulty Very difficult Extremely difficult Somewhat difficult   Insomnia At his last visit he was transitioned from Trazodone to [place] since the Trazodone was not effective. He states he ran out of the [place] and restarted his Trazodone and is now sleeping great. He would like to continue the Trazodone.   New complaints: Right knee swollen x2 weeks. He fell getting into the vehicle recently. He states his pain medications help with this. He states he will make an orthopedic appointment.    Social history:  Relevant past medical, surgical, family and social history reviewed and updated as indicated. Interim medical history since our last visit reviewed.  Allergies and medications reviewed and updated.  DATA REVIEWED: CHART IN EPIC  ROS: Negative unless specifically indicated above in HPI.    Current Outpatient Medications:    albuterol (VENTOLIN HFA) 108 (90 Base) MCG/ACT inhaler, Inhale 2 puffs into the lungs every 6 (six) hours as needed., Disp: 18 g, Rfl: 2   aspirin 81 MG EC tablet, Take 1 tablet by mouth daily., Disp: , Rfl:    CINNAMON PO, Take 2,000 mg by mouth daily. , Disp: , Rfl:    clopidogrel (PLAVIX) 75 MG tablet, Take 75 mg by mouth daily., Disp: , Rfl:    digoxin (LANOXIN) 0.125 MG tablet, Take by mouth., Disp: , Rfl:    Ferrous Sulfate (IRON PO), Take by mouth., Disp: , Rfl:    fexofenadine (ALLEGRA) 60 MG tablet, Take 60 mg by mouth daily., Disp: , Rfl:    FLUoxetine (PROZAC) 20 MG capsule, TAKE ONE CAPSULE BY MOUTH DAILY,  Disp: 30 capsule, Rfl: 2   folic acid (FOLVITE) 1 MG tablet, Take 2 mg by mouth daily., Disp: , Rfl:    furosemide (LASIX) 40 MG tablet, Take 40 mg by mouth 2 (two) times daily., Disp: , Rfl:    guaifenesin (HUMIBID E) 400 MG TABS tablet, Take by mouth., Disp: , Rfl:    ipratropium-albuterol (DUONEB) 0.5-2.5 (3) MG/3ML SOLN, Take 3 mLs by  nebulization every 4 (four) hours as needed., Disp: 360 mL, Rfl: 2   losartan (COZAAR) 25 MG tablet, Take 12.5 mg by mouth daily., Disp: , Rfl:    magnesium oxide (MAG-OX) 400 MG tablet, Take by mouth., Disp: , Rfl:    Methotrexate 25 MG/ML SOSY, Inject into the skin., Disp: , Rfl:    methotrexate 50 MG/2ML injection, Inject 20 mg into the skin once a week., Disp: , Rfl:    morphine (MSIR) 15 MG tablet, Take 15 mg by mouth at bedtime., Disp: , Rfl:    Multiple Vitamins-Minerals (MEGA MULTIVITAMIN FOR MEN PO), Take 1 tablet by mouth daily. , Disp: , Rfl:    pantoprazole (PROTONIX) 40 MG tablet, Take 1 tablet by mouth daily., Disp: , Rfl:    potassium chloride (MICRO-K) 10 MEQ CR capsule, Take 10 mEq by mouth daily., Disp: , Rfl:    pregabalin (LYRICA) 50 MG capsule, Take 50 mg by mouth 2 (two) times daily., Disp: , Rfl:    tamsulosin (FLOMAX) 0.4 MG CAPS capsule, Take 0.4 mg by mouth daily., Disp: , Rfl:    traZODone (DESYREL) 50 MG tablet, Take 1 tablet (50 mg total) by mouth at bedtime., Disp: 90 tablet, Rfl: 1   diltiazem (TIAZAC) 360 MG 24 hr capsule, Take 1 capsule (360 mg total) by mouth daily., Disp: 90 capsule, Rfl: 1   metFORMIN (GLUCOPHAGE-XR) 500 MG 24 hr tablet, Take 1 tablet (500 mg total) by mouth 3 (three) times daily with meals., Disp: 270 tablet, Rfl: 1 No current facility-administered medications for this visit.  Facility-Administered Medications Ordered in Other Visits:    ondansetron (ZOFRAN) 4 mg in sodium chloride 0.9 % 50 mL IVPB, 4 mg, Intravenous, Q6H PRN, Ashok Pall, MD   Allergies  Allergen Reactions   Metoprolol Shortness Of  Breath    Patient reports SOB while taking   Abatacept Other (See Comments)   Infliximab Other (See Comments)   Methotrexate Other (See Comments)   Other Other (See Comments)   Statins Other (See Comments)    Pain   Past Medical History:  Diagnosis Date   A-fib ([place])    Chest pain    Chronic back pain    Diabetic nephropathy ([place]) 09/17/2020   Dysrhythmia    a-fib   Fracture of lateral malleolus 02/11/2015   Gastro-esophageal reflux disease with esophagitis    Hyperlipidemia, unspecified    Hypertension    Insomnia, unspecified    Lumbar radiculopathy    Nicotine dependence, unspecified, uncomplicated    Pacemaker    CHB, PAF   Pain in left shoulder    Pain in right leg    Pneumonia    Pre-diabetes    Presence of cardiac pacemaker    Rheumatoid arthritis, unspecified ([place])    Type 2 diabetes mellitus without complications ([place])    Unilateral inguinal hernia, without obstruction or gangrene, not specified as recurrent     Past Surgical History:  Procedure Laterality Date   CHOLECYSTECTOMY     DENTAL SURGERY     implants   INGUINAL HERNIA REPAIR Bilateral 04/24/2016   Procedure: LAPAROSCOPIC BILATERAL INGUINAL HERNIA REPAIR;  Surgeon: Coralie Keens, MD;  Location: [facility];  Service: General;  Laterality: Bilateral;   INSERTION OF MESH Bilateral 04/24/2016   Procedure: INSERTION OF MESH;  Surgeon: Coralie Keens, MD;  Location: [facility];  Service: General;  Laterality: Bilateral;   LEFT ATRIAL APPENDAGE OCCLUSION     LUMBAR LAMINECTOMY/DECOMPRESSION MICRODISCECTOMY Left  04/10/2018   Procedure: Left Lumbar two-three Laminectomy/Foraminotomy;  Surgeon: Ashok Pall, MD;  Location: [facility];  Service: Neurosurgery;  Laterality: Left;   REVERSE SHOULDER ARTHROPLASTY Left 02/24/2019   Procedure: REVERSE TOTAL SHOULDER ARTHROPLASTY;  Surgeon: Hiram Gash, MD;  Location: [facility];  Service: Orthopedics;  Laterality: Left;   SHOULDER ARTHROSCOPY  Right    x2   VASECTOMY      Social History   Socioeconomic History   Marital status: Married    Spouse name: Not on file   Number of children: 2   Years of education: Not on file   Highest education level: Not on file  Occupational History   Not on file  Tobacco Use   Smoking status: Former    Packs/day: 1.00    Years: 25.00    Pack years: 25.00    Types: Cigarettes    Quit date: 02/15/2020    Years since quitting: 1.2   Smokeless tobacco: Never  Vaping Use   Vaping Use: Never used  Substance and Sexual Activity   Alcohol use: Yes    Comment: social   Drug use: No   Sexual activity: Yes    Birth control/protection: None  Other Topics Concern   Not on file  Social History Narrative   Lives home with wife - children live nearby   Social Determinants of Health   Financial Resource Strain: Low Risk    Difficulty of Paying Living Expenses: Not hard at all  Food Insecurity: No Food Insecurity   Worried About Charity fundraiser in the Last Year: Never true   Arboriculturist in the Last Year: Never true  Transportation Needs: No Transportation Needs   Lack of Transportation (Medical): No   Lack of Transportation (Non-Medical): No  Physical Activity: Insufficiently Active   Days of Exercise per Week: 7 days   Minutes of Exercise per Session: 10 min  Stress: Stress Concern Present   Feeling of Stress : Very much  Social Connections: Moderately Integrated   Frequency of Communication with Friends and Family: More than three times a week   Frequency of Social Gatherings with Friends and Family: Once a week   Attends Religious Services: 1 to 4 times per year   Active Member of [employer] or Organizations: No   Attends Music therapist: Never   Marital Status: Married  Human resources officer Violence: Not At Risk   Fear of Current or Ex-Partner: No   Emotionally Abused: No   Physically Abused: No   Sexually Abused: No        Objective:    BP 124/68   Pulse 89    Temp 97.8 F (36.6 C) (Temporal)   Ht 5' 6"  (1.676 m)   Wt 70.9 kg   SpO2 90%   BMI 25.24 kg/m   Oxygen on room air as soon as patient started walking: 87% He did come up to 50% after application of oxygen at 2L via [place].  Wt Readings from Last 3 Encounters:  05/30/21 156 lb 6.4 oz (70.9 kg)  02/14/21 176 lb (79.8 kg)  12/20/20 178 lb (80.7 kg)    Physical Exam Vitals reviewed.  Constitutional:      General: He is not in acute distress.    Appearance: Normal appearance. He is normal weight. He is not ill-appearing, toxic-appearing or diaphoretic.  HENT:     Head: Normocephalic and atraumatic.  Eyes:     General: No scleral icterus.  Right eye: No discharge.        Left eye: No discharge.     Conjunctiva/sclera: Conjunctivae normal.  Cardiovascular:     Rate and Rhythm: Normal rate. Rhythm irregular.     Heart sounds: Normal heart sounds. No murmur heard.   No friction rub. No gallop.  Pulmonary:     Effort: Pulmonary effort is normal. No respiratory distress.     Breath sounds: No stridor. Decreased breath sounds present. No wheezing, rhonchi or rales.  Musculoskeletal:        General: Swelling (right knee) and tenderness (right knee and shoulder) present. Normal range of motion.     Cervical back: Normal range of motion.     Right lower leg: No edema.     Left lower leg: No edema.  Skin:    General: Skin is warm and dry.  Neurological:     Mental Status: He is alert and oriented to person, place, and time. Mental status is at baseline.  Psychiatric:        Mood and Affect: Mood normal.        Behavior: Behavior normal.        Thought Content: Thought content normal.        Judgment: Judgment normal.    Lab Results  Component Value Date   TSH 3.610 04/26/2020   Lab Results  Component Value Date   WBC 12.2 (H) 02/14/2021   HGB 10.0 (L) 02/14/2021   HCT 32.7 (L) 02/14/2021   MCV 75 (L) 02/14/2021   PLT 390 02/14/2021   Lab Results  Component Value  Date   NA 143 02/14/2021   K 3.8 02/14/2021   CO2 23 02/14/2021   GLUCOSE 120 (H) 02/14/2021   BUN 9 02/14/2021   CREATININE 1.17 02/14/2021   BILITOT <0.2 02/14/2021   ALKPHOS 146 (H) 02/14/2021   AST 8 02/14/2021   ALT 5 02/14/2021   PROT 6.5 02/14/2021   ALBUMIN 3.9 02/14/2021   CALCIUM 9.3 02/14/2021   ANIONGAP 13 02/15/2019   EGFR 67 02/14/2021   Lab Results  Component Value Date   CHOL 201 (H) 09/15/2020   Lab Results  Component Value Date   HDL 50 09/15/2020   Lab Results  Component Value Date   LDLCALC 98 09/15/2020   Lab Results  Component Value Date   TRIG 317 (H) 09/15/2020   Lab Results  Component Value Date   CHOLHDL 4.0 09/15/2020   Lab Results  Component Value Date   HGBA1C 7.2 (H) 11/27/2020

## 2021-05-31 ENCOUNTER — Other Ambulatory Visit: Payer: Self-pay | Admitting: Family Medicine

## 2021-05-31 DIAGNOSIS — I5032 Chronic diastolic (congestive) heart failure: Secondary | ICD-10-CM

## 2021-05-31 DIAGNOSIS — I10 Essential (primary) hypertension: Secondary | ICD-10-CM

## 2021-05-31 LAB — ANEMIA PROFILE B
Basophils Absolute: 0.1 10*3/uL (ref 0.0–0.2)
Basos: 1 %
EOS (ABSOLUTE): 0.2 10*3/uL (ref 0.0–0.4)
Eos: 2 %
Ferritin: 106 ng/mL (ref 30–400)
Folate: 14.8 ng/mL (ref >3.0)
Hematocrit: 36.1 % — ABNORMAL LOW (ref 37.5–51.0)
Hemoglobin: 12.1 g/dL — ABNORMAL LOW (ref 13.0–17.7)
Immature Grans (Abs): 0 10*3/uL (ref 0.0–0.1)
Immature Granulocytes: 0 %
Iron Saturation: 14 % — ABNORMAL LOW (ref 15–55)
Iron: 35 ug/dL — ABNORMAL LOW (ref 38–169)
Lymphocytes Absolute: 1.3 10*3/uL (ref 0.7–3.1)
Lymphs: 14 %
MCH: 25.2 pg — ABNORMAL LOW (ref 26.6–33.0)
MCHC: 33.5 g/dL (ref 31.5–35.7)
MCV: 75 fL — ABNORMAL LOW (ref 79–97)
Monocytes Absolute: 0.6 10*3/uL (ref 0.1–0.9)
Monocytes: 7 %
Neutrophils Absolute: 6.9 10*3/uL (ref 1.4–7.0)
Neutrophils: 76 %
Platelets: 492 10*3/uL — ABNORMAL HIGH (ref 150–450)
RBC: 4.8 x10E6/uL (ref 4.14–5.80)
RDW: 15.9 % — ABNORMAL HIGH (ref 11.6–15.4)
Retic Ct Pct: 1.1 % (ref 0.6–2.6)
Total Iron Binding Capacity: 258 ug/dL (ref 250–450)
UIBC: 223 ug/dL (ref 111–343)
Vitamin B-12: 825 pg/mL (ref 232–1245)
WBC: 9.2 10*3/uL (ref 3.4–10.8)

## 2021-05-31 LAB — CMP14+EGFR
ALT: 5 IU/L (ref 0–44)
AST: 9 IU/L (ref 0–40)
Albumin/Globulin Ratio: 1.1 — ABNORMAL LOW (ref 1.2–2.2)
Albumin: 3.6 g/dL — ABNORMAL LOW (ref 3.7–4.7)
Alkaline Phosphatase: 113 IU/L (ref 44–121)
BUN/Creatinine Ratio: 12 (ref 10–24)
BUN: 11 mg/dL (ref 8–27)
Bilirubin Total: 0.3 mg/dL (ref 0.0–1.2)
CO2: 26 mmol/L (ref 20–29)
Calcium: 9.8 mg/dL (ref 8.6–10.2)
Chloride: 93 mmol/L — ABNORMAL LOW (ref 96–106)
Creatinine, Ser: 0.94 mg/dL (ref 0.76–1.27)
Globulin, Total: 3.4 g/dL (ref 1.5–4.5)
Glucose: 132 mg/dL — ABNORMAL HIGH (ref 70–99)
Potassium: 4 mmol/L (ref 3.5–5.2)
Sodium: 137 mmol/L (ref 134–144)
Total Protein: 7 g/dL (ref 6.0–8.5)
eGFR: 87 mL/min/{1.73_m2} (ref >59)

## 2021-05-31 LAB — LIPID PANEL
Chol/HDL Ratio: 3.5 ratio (ref 0.0–5.0)
Cholesterol, Total: 175 mg/dL (ref 100–199)
HDL: 50 mg/dL (ref >39)
LDL Chol Calc (NIH): 98 mg/dL (ref 0–99)
Triglycerides: 156 mg/dL — ABNORMAL HIGH (ref 0–149)
VLDL Cholesterol Cal: 27 mg/dL (ref 5–40)

## 2021-05-31 NOTE — Addendum Note (Signed)
Addended by: Loman Brooklyn on: 05/31/2021 02:18 PM   Modules accepted: Orders

## 2021-06-10 DIAGNOSIS — Z20828 Contact with and (suspected) exposure to other viral communicable diseases: Secondary | ICD-10-CM | POA: Diagnosis not present

## 2021-06-18 ENCOUNTER — Other Ambulatory Visit: Payer: Self-pay | Admitting: Family Medicine

## 2021-06-18 DIAGNOSIS — F5101 Primary insomnia: Secondary | ICD-10-CM

## 2021-06-25 ENCOUNTER — Ambulatory Visit (INDEPENDENT_AMBULATORY_CARE_PROVIDER_SITE_OTHER): Payer: Medicare Other

## 2021-06-25 DIAGNOSIS — I442 Atrioventricular block, complete: Secondary | ICD-10-CM | POA: Diagnosis not present

## 2021-06-26 LAB — CUP PACEART REMOTE DEVICE CHECK
Battery Impedance: 1328 Ohm
Battery Remaining Longevity: 50 mo
Battery Voltage: 2.78 V
Brady Statistic AP VP Percent: 8 %
Brady Statistic AP VS Percent: 0 %
Brady Statistic AS VP Percent: 51 %
Brady Statistic AS VS Percent: 41 %
Date Time Interrogation Session: 20221129161322
Implantable Lead Implant Date: 20140421
Implantable Lead Implant Date: 20140421
Implantable Lead Location: 753859
Implantable Lead Location: 753860
Implantable Lead Model: 5076
Implantable Lead Model: 5592
Implantable Pulse Generator Implant Date: 20140421
Lead Channel Impedance Value: 448 Ohm
Lead Channel Impedance Value: 472 Ohm
Lead Channel Pacing Threshold Amplitude: 0.375 V
Lead Channel Pacing Threshold Amplitude: 0.625 V
Lead Channel Pacing Threshold Pulse Width: 0.4 ms
Lead Channel Pacing Threshold Pulse Width: 0.4 ms
Lead Channel Setting Pacing Amplitude: 1.5 V
Lead Channel Setting Pacing Amplitude: 2 V
Lead Channel Setting Pacing Pulse Width: 0.4 ms
Lead Channel Setting Sensing Sensitivity: 2.8 mV

## 2021-07-04 NOTE — Progress Notes (Signed)
Remote pacemaker transmission.   

## 2021-07-09 ENCOUNTER — Telehealth: Payer: Self-pay | Admitting: Family Medicine

## 2021-07-09 DIAGNOSIS — J9601 Acute respiratory failure with hypoxia: Secondary | ICD-10-CM | POA: Diagnosis not present

## 2021-07-09 DIAGNOSIS — Z9981 Dependence on supplemental oxygen: Secondary | ICD-10-CM | POA: Diagnosis not present

## 2021-07-09 DIAGNOSIS — M069 Rheumatoid arthritis, unspecified: Secondary | ICD-10-CM | POA: Diagnosis not present

## 2021-07-09 DIAGNOSIS — J158 Pneumonia due to other specified bacteria: Secondary | ICD-10-CM | POA: Diagnosis not present

## 2021-07-09 DIAGNOSIS — I5033 Acute on chronic diastolic (congestive) heart failure: Secondary | ICD-10-CM | POA: Diagnosis not present

## 2021-07-09 DIAGNOSIS — I10 Essential (primary) hypertension: Secondary | ICD-10-CM | POA: Diagnosis not present

## 2021-07-09 DIAGNOSIS — R079 Chest pain, unspecified: Secondary | ICD-10-CM | POA: Diagnosis not present

## 2021-07-09 DIAGNOSIS — I480 Paroxysmal atrial fibrillation: Secondary | ICD-10-CM | POA: Diagnosis not present

## 2021-07-09 DIAGNOSIS — I110 Hypertensive heart disease with heart failure: Secondary | ICD-10-CM | POA: Diagnosis not present

## 2021-07-09 DIAGNOSIS — Z950 Presence of cardiac pacemaker: Secondary | ICD-10-CM | POA: Diagnosis not present

## 2021-07-09 DIAGNOSIS — F5101 Primary insomnia: Secondary | ICD-10-CM

## 2021-07-09 NOTE — Telephone Encounter (Signed)
TC to [facility], refill pt got on 06/18/21 for #9 with no refills left must have been refilled from an old Rx, the have the new one from 05/30/21 #90 with a refill on file. Pt aware they will get it ready

## 2021-07-09 NOTE — Telephone Encounter (Signed)
  Prescription Request  07/09/2021  Is this a "Controlled Substance" medicine? No  Have you seen your PCP in the last 2 weeks? No, pt was seen last month-Nov 2  If YES, route message to pool  -  If NO, patient needs to be scheduled for appointment.  What is the name of the medication or equipment? Trazodone-50mg   Have you contacted your pharmacy to request a refill? yes   Which pharmacy would you like this sent to? [facility]   Patient notified that their request is being sent to the clinical staff for review and that they should receive a response within 2 business days.    Joyce's pt.  Please call wife.

## 2021-07-10 DIAGNOSIS — Z20828 Contact with and (suspected) exposure to other viral communicable diseases: Secondary | ICD-10-CM | POA: Diagnosis not present

## 2021-07-30 ENCOUNTER — Other Ambulatory Visit: Payer: Self-pay | Admitting: Family Medicine

## 2021-07-30 DIAGNOSIS — F332 Major depressive disorder, recurrent severe without psychotic features: Secondary | ICD-10-CM

## 2021-07-31 ENCOUNTER — Other Ambulatory Visit (HOSPITAL_COMMUNITY): Payer: Self-pay | Admitting: Orthopaedic Surgery

## 2021-07-31 ENCOUNTER — Other Ambulatory Visit: Payer: Self-pay | Admitting: Orthopaedic Surgery

## 2021-07-31 ENCOUNTER — Ambulatory Visit (HOSPITAL_COMMUNITY)
Admission: RE | Admit: 2021-07-31 | Discharge: 2021-07-31 | Disposition: A | Discharge status: Home / Self Care | Payer: Medicare Other | Source: Ambulatory Visit | Attending: Orthopaedic Surgery | Admitting: Orthopaedic Surgery

## 2021-07-31 DIAGNOSIS — M25461 Effusion, right knee: Secondary | ICD-10-CM | POA: Diagnosis not present

## 2021-07-31 DIAGNOSIS — M79604 Pain in right leg: Secondary | ICD-10-CM | POA: Diagnosis not present

## 2021-07-31 DIAGNOSIS — M25561 Pain in right knee: Secondary | ICD-10-CM | POA: Diagnosis not present

## 2021-07-31 DIAGNOSIS — M7989 Other specified soft tissue disorders: Secondary | ICD-10-CM

## 2021-07-31 NOTE — Progress Notes (Signed)
RLE venous duplex has been completed.  Preliminary results given to Dr. Griffin Basil.   Results can be found under chart review under CV PROC. 07/31/2021 11:58 AM Lashona Schaaf RVT, RDMS

## 2021-08-07 ENCOUNTER — Ambulatory Visit: Payer: Medicare Other | Admitting: Family Medicine

## 2021-08-10 DIAGNOSIS — Z20828 Contact with and (suspected) exposure to other viral communicable diseases: Secondary | ICD-10-CM | POA: Diagnosis not present

## 2021-08-14 DIAGNOSIS — M25561 Pain in right knee: Secondary | ICD-10-CM | POA: Diagnosis not present

## 2021-08-16 DIAGNOSIS — Z6825 Body mass index (BMI) 25.0-25.9, adult: Secondary | ICD-10-CM | POA: Diagnosis not present

## 2021-08-16 DIAGNOSIS — M0579 Rheumatoid arthritis with rheumatoid factor of multiple sites without organ or systems involvement: Secondary | ICD-10-CM | POA: Diagnosis not present

## 2021-08-16 DIAGNOSIS — M150 Primary generalized (osteo)arthritis: Secondary | ICD-10-CM | POA: Diagnosis not present

## 2021-08-16 DIAGNOSIS — G894 Chronic pain syndrome: Secondary | ICD-10-CM | POA: Diagnosis not present

## 2021-08-16 DIAGNOSIS — Z79899 Other long term (current) drug therapy: Secondary | ICD-10-CM | POA: Diagnosis not present

## 2021-08-16 DIAGNOSIS — E663 Overweight: Secondary | ICD-10-CM | POA: Diagnosis not present

## 2021-08-21 DIAGNOSIS — M1711 Unilateral primary osteoarthritis, right knee: Secondary | ICD-10-CM | POA: Diagnosis not present

## 2021-08-24 DIAGNOSIS — R58 Hemorrhage, not elsewhere classified: Secondary | ICD-10-CM | POA: Diagnosis not present

## 2021-08-24 DIAGNOSIS — R404 Transient alteration of awareness: Secondary | ICD-10-CM | POA: Diagnosis not present

## 2021-08-24 DIAGNOSIS — I469 Cardiac arrest, cause unspecified: Secondary | ICD-10-CM | POA: Diagnosis not present

## 2021-08-24 DIAGNOSIS — I499 Cardiac arrhythmia, unspecified: Secondary | ICD-10-CM | POA: Diagnosis not present

## 2021-08-27 ENCOUNTER — Telehealth: Payer: Self-pay | Admitting: Family Medicine

## 2021-08-27 NOTE — Telephone Encounter (Signed)
I have spoken to patient's wife who reports patient started not feeling well on Thursday, 08/23/2021, with a cold. He started taking OTC Coricidin HBP and was feeling better Friday morning. That evening around 5:30 PM he wasn't feeling well again and wanted to go to bed. He did not eat dinner. She kept checking on him approximately every 30 minutes. Around 7:30 PM she found him cold, pulseless, and not breathing. She called EMS who came and worked his code for at least 30 minutes. CBG of 214. No suspicious activity. EMS called [place] and pronounced patient deceased at [phone].

## 2021-08-29 DIAGNOSIS — 419620001 Death: Secondary | SNOMED CT | POA: Diagnosis not present

## 2021-08-29 DEATH — deceased

## 2021-08-30 ENCOUNTER — Ambulatory Visit: Payer: Medicare Other | Admitting: Family Medicine

## 2021-12-21 ENCOUNTER — Ambulatory Visit: Payer: Medicare Other
